# Patient Record
Sex: Female | Born: 1960 | Race: White | Hispanic: No | Marital: Married | Smoking: Never smoker
Health system: Southern US, Community
[De-identification: ages and names within clinical notes are randomized; demographics above are authoritative.]

## PROBLEM LIST (undated history)

## (undated) DIAGNOSIS — I2699 Other pulmonary embolism without acute cor pulmonale: Secondary | ICD-10-CM

## (undated) DIAGNOSIS — I2510 Atherosclerotic heart disease of native coronary artery without angina pectoris: Secondary | ICD-10-CM

## (undated) DIAGNOSIS — E785 Hyperlipidemia, unspecified: Secondary | ICD-10-CM

## (undated) DIAGNOSIS — I219 Acute myocardial infarction, unspecified: Secondary | ICD-10-CM

## (undated) DIAGNOSIS — R002 Palpitations: Secondary | ICD-10-CM

## (undated) DIAGNOSIS — M1990 Unspecified osteoarthritis, unspecified site: Secondary | ICD-10-CM

## (undated) DIAGNOSIS — S065XAA Traumatic subdural hemorrhage with loss of consciousness status unknown, initial encounter: Secondary | ICD-10-CM

## (undated) DIAGNOSIS — I10 Essential (primary) hypertension: Secondary | ICD-10-CM

## (undated) DIAGNOSIS — I5032 Chronic diastolic (congestive) heart failure: Secondary | ICD-10-CM

## (undated) DIAGNOSIS — R413 Other amnesia: Secondary | ICD-10-CM

## (undated) DIAGNOSIS — I509 Heart failure, unspecified: Secondary | ICD-10-CM

## (undated) DIAGNOSIS — M419 Scoliosis, unspecified: Secondary | ICD-10-CM

## (undated) DIAGNOSIS — G8929 Other chronic pain: Secondary | ICD-10-CM

## (undated) HISTORY — DX: Other amnesia: R41.3

## (undated) HISTORY — DX: Atherosclerotic heart disease of native coronary artery without angina pectoris: I25.10

## (undated) HISTORY — DX: Traumatic subdural hemorrhage with loss of consciousness status unknown, initial encounter: S06.5XAA

## (undated) HISTORY — PX: DILATION AND CURETTAGE OF UTERUS: SHX78

## (undated) HISTORY — DX: Heart failure, unspecified: I50.9

## (undated) HISTORY — DX: Palpitations: R00.2

## (undated) HISTORY — DX: Other pulmonary embolism without acute cor pulmonale: I26.99

## (undated) HISTORY — DX: Chronic diastolic (congestive) heart failure: I50.32

## (undated) HISTORY — DX: Other chronic pain: G89.29

## (undated) HISTORY — DX: Acute myocardial infarction, unspecified: I21.9

## (undated) HISTORY — DX: Hyperlipidemia, unspecified: E78.5

## (undated) HISTORY — PX: OTHER SURGICAL HISTORY: SHX169

## (undated) HISTORY — DX: Essential (primary) hypertension: I10

---

## 1998-10-06 HISTORY — PX: ABDOMINAL HYSTERECTOMY: SHX81

## 2002-04-23 ENCOUNTER — Inpatient Hospital Stay (HOSPITAL_COMMUNITY): Admission: EM | Admit: 2002-04-23 | Discharge: 2002-04-26 | Payer: Self-pay | Admitting: *Deleted

## 2005-03-11 ENCOUNTER — Other Ambulatory Visit: Payer: Self-pay

## 2006-02-17 ENCOUNTER — Other Ambulatory Visit: Payer: Self-pay

## 2006-02-24 ENCOUNTER — Inpatient Hospital Stay: Payer: Self-pay | Admitting: Unknown Physician Specialty

## 2007-04-28 ENCOUNTER — Ambulatory Visit: Payer: Self-pay | Admitting: Nurse Practitioner

## 2007-10-12 ENCOUNTER — Observation Stay: Payer: Self-pay | Admitting: Specialist

## 2007-10-12 ENCOUNTER — Other Ambulatory Visit: Payer: Self-pay

## 2008-07-14 ENCOUNTER — Ambulatory Visit: Payer: Self-pay | Admitting: Unknown Physician Specialty

## 2010-12-28 ENCOUNTER — Emergency Department: Payer: Self-pay | Admitting: Emergency Medicine

## 2011-08-21 ENCOUNTER — Ambulatory Visit: Payer: Self-pay | Admitting: Obstetrics and Gynecology

## 2014-07-14 ENCOUNTER — Emergency Department: Payer: Self-pay | Admitting: Emergency Medicine

## 2014-07-14 LAB — BASIC METABOLIC PANEL
ANION GAP: 8 (ref 7–16)
BUN: 12 mg/dL (ref 7–18)
CO2: 29 mmol/L (ref 21–32)
CREATININE: 0.76 mg/dL (ref 0.60–1.30)
Calcium, Total: 9.2 mg/dL (ref 8.5–10.1)
Chloride: 106 mmol/L (ref 98–107)
EGFR (Non-African Amer.): >60
GLUCOSE: 98 mg/dL (ref 65–99)
Osmolality: 285 (ref 275–301)
Potassium: 3.9 mmol/L (ref 3.5–5.1)
Sodium: 143 mmol/L (ref 136–145)

## 2014-07-14 LAB — CSF CELL COUNT WITH DIFFERENTIAL
CSF TUBE #: 1
CSF TUBE #: 3
EOS PCT: 0 %
EOS PCT: 0 %
LYMPHS PCT: 50 %
LYMPHS PCT: 60 %
MONOCYTES/MACROPHAGES: 10 %
Monocytes/Macrophages: 25 %
NEUTROS PCT: 25 %
Neutrophils: 30 %
OTHER CELLS: 0 %
Other Cells: 0 %
RBC (CSF): 117 /mm3
RBC (CSF): 2445 /mm3
WBC (CSF): 3 /mm3
WBC (CSF): 5 /mm3

## 2014-07-14 LAB — CBC WITH DIFFERENTIAL/PLATELET
Basophil #: 0.1 10*3/uL (ref 0.0–0.1)
Basophil %: 0.9 %
Eosinophil #: 0.1 10*3/uL (ref 0.0–0.7)
Eosinophil %: 1.9 %
HCT: 44.2 % (ref 35.0–47.0)
HGB: 14.3 g/dL (ref 12.0–16.0)
Lymphocyte #: 1.7 10*3/uL (ref 1.0–3.6)
Lymphocyte %: 24.1 %
MCH: 29.2 pg (ref 26.0–34.0)
MCHC: 32.4 g/dL (ref 32.0–36.0)
MCV: 90 fL (ref 80–100)
Monocyte #: 0.4 x10 3/mm (ref 0.2–0.9)
Monocyte %: 5.4 %
Neutrophil #: 4.8 10*3/uL (ref 1.4–6.5)
Neutrophil %: 67.7 %
PLATELETS: 268 10*3/uL (ref 150–440)
RBC: 4.9 10*6/uL (ref 3.80–5.20)
RDW: 13.2 % (ref 11.5–14.5)
WBC: 7.1 10*3/uL (ref 3.6–11.0)

## 2014-07-14 LAB — PROTIME-INR
INR: 1
Prothrombin Time: 12.9 secs (ref 11.5–14.7)

## 2014-07-14 LAB — GLUCOSE, CSF: GLUCOSE, CSF: 58 mg/dL (ref 40–75)

## 2014-07-14 LAB — PROTEIN, CSF: PROTEIN, CSF: 31 mg/dL (ref 15–45)

## 2014-07-14 LAB — APTT: Activated PTT: 27.4 secs (ref 23.6–35.9)

## 2014-07-15 ENCOUNTER — Encounter (HOSPITAL_COMMUNITY): Payer: Self-pay | Admitting: Internal Medicine

## 2014-07-15 ENCOUNTER — Inpatient Hospital Stay (HOSPITAL_COMMUNITY): Payer: Self-pay

## 2014-07-15 ENCOUNTER — Inpatient Hospital Stay (HOSPITAL_COMMUNITY)
Admission: EM | Admit: 2014-07-15 | Discharge: 2014-07-15 | DRG: 093 | Disposition: A | Discharge status: Home / Self Care | Payer: Self-pay | Source: Other Acute Inpatient Hospital | Attending: Internal Medicine | Admitting: Internal Medicine

## 2014-07-15 ENCOUNTER — Other Ambulatory Visit (HOSPITAL_COMMUNITY): Payer: Self-pay | Admitting: Internal Medicine

## 2014-07-15 DIAGNOSIS — R51 Headache: Secondary | ICD-10-CM

## 2014-07-15 DIAGNOSIS — M1990 Unspecified osteoarthritis, unspecified site: Secondary | ICD-10-CM | POA: Diagnosis present

## 2014-07-15 DIAGNOSIS — R839 Unspecified abnormal finding in cerebrospinal fluid: Secondary | ICD-10-CM | POA: Diagnosis present

## 2014-07-15 DIAGNOSIS — M797 Fibromyalgia: Secondary | ICD-10-CM | POA: Diagnosis present

## 2014-07-15 DIAGNOSIS — I10 Essential (primary) hypertension: Secondary | ICD-10-CM | POA: Diagnosis present

## 2014-07-15 DIAGNOSIS — R291 Meningismus: Principal | ICD-10-CM | POA: Diagnosis present

## 2014-07-15 DIAGNOSIS — G44201 Tension-type headache, unspecified, intractable: Secondary | ICD-10-CM

## 2014-07-15 DIAGNOSIS — R519 Headache, unspecified: Secondary | ICD-10-CM

## 2014-07-15 DIAGNOSIS — Z96649 Presence of unspecified artificial hip joint: Secondary | ICD-10-CM | POA: Diagnosis present

## 2014-07-15 DIAGNOSIS — M419 Scoliosis, unspecified: Secondary | ICD-10-CM | POA: Diagnosis present

## 2014-07-15 DIAGNOSIS — Z79899 Other long term (current) drug therapy: Secondary | ICD-10-CM

## 2014-07-15 DIAGNOSIS — G629 Polyneuropathy, unspecified: Secondary | ICD-10-CM | POA: Diagnosis present

## 2014-07-15 HISTORY — DX: Fibromyalgia: M79.7

## 2014-07-15 HISTORY — DX: Unspecified abnormal finding in cerebrospinal fluid: R83.9

## 2014-07-15 HISTORY — DX: Scoliosis, unspecified: M41.9

## 2014-07-15 HISTORY — DX: Unspecified osteoarthritis, unspecified site: M19.90

## 2014-07-15 LAB — CBC WITH DIFFERENTIAL/PLATELET
Basophils Absolute: 0 10*3/uL (ref 0.0–0.1)
Basophils Relative: 0 % (ref 0–1)
EOS PCT: 2 % (ref 0–5)
Eosinophils Absolute: 0.2 10*3/uL (ref 0.0–0.7)
HCT: 38.3 % (ref 36.0–46.0)
HEMOGLOBIN: 13 g/dL (ref 12.0–15.0)
LYMPHS ABS: 3.3 10*3/uL (ref 0.7–4.0)
Lymphocytes Relative: 42 % (ref 12–46)
MCH: 29.3 pg (ref 26.0–34.0)
MCHC: 33.9 g/dL (ref 30.0–36.0)
MCV: 86.5 fL (ref 78.0–100.0)
MONOS PCT: 6 % (ref 3–12)
Monocytes Absolute: 0.5 10*3/uL (ref 0.1–1.0)
Neutro Abs: 3.9 10*3/uL (ref 1.7–7.7)
Neutrophils Relative %: 50 % (ref 43–77)
Platelets: 255 10*3/uL (ref 150–400)
RBC: 4.43 MIL/uL (ref 3.87–5.11)
RDW: 13 % (ref 11.5–15.5)
WBC: 7.8 10*3/uL (ref 4.0–10.5)

## 2014-07-15 LAB — COMPREHENSIVE METABOLIC PANEL
ALT: 17 U/L (ref 0–35)
AST: 17 U/L (ref 0–37)
Albumin: 3.2 g/dL — ABNORMAL LOW (ref 3.5–5.2)
Alkaline Phosphatase: 67 U/L (ref 39–117)
Anion gap: 13 (ref 5–15)
BUN: 14 mg/dL (ref 6–23)
CALCIUM: 9 mg/dL (ref 8.4–10.5)
CO2: 27 mEq/L (ref 19–32)
Chloride: 104 mEq/L (ref 96–112)
Creatinine, Ser: 0.92 mg/dL (ref 0.50–1.10)
GFR, EST AFRICAN AMERICAN: 81 mL/min — AB (ref >90)
GFR, EST NON AFRICAN AMERICAN: 70 mL/min — AB (ref >90)
GLUCOSE: 110 mg/dL — AB (ref 70–99)
Potassium: 3.7 mEq/L (ref 3.7–5.3)
Sodium: 144 mEq/L (ref 137–147)
TOTAL PROTEIN: 6.4 g/dL (ref 6.0–8.3)
Total Bilirubin: 0.4 mg/dL (ref 0.3–1.2)

## 2014-07-15 LAB — LIPID PANEL
CHOLESTEROL: 155 mg/dL (ref 0–200)
HDL: 47 mg/dL (ref >39)
LDL Cholesterol: 87 mg/dL (ref 0–99)
Total CHOL/HDL Ratio: 3.3 RATIO
Triglycerides: 103 mg/dL (ref <150)
VLDL: 21 mg/dL (ref 0–40)

## 2014-07-15 LAB — PROTIME-INR
INR: 1.12 (ref 0.00–1.49)
Prothrombin Time: 14.4 seconds (ref 11.6–15.2)

## 2014-07-15 MED ORDER — ACETAMINOPHEN 325 MG PO TABS: 650.0000 mg | ORAL_TABLET | ORAL | Status: DC | PRN

## 2014-07-15 MED ORDER — KETOROLAC TROMETHAMINE 30 MG/ML IJ SOLN
30.0000 mg | Freq: Four times a day (QID) | INTRAMUSCULAR | Status: DC | PRN
  Administered 2014-07-15: 30 mg via INTRAVENOUS
  Filled 2014-07-15 (×2): qty 1

## 2014-07-15 MED ORDER — GABAPENTIN 300 MG PO CAPS
300.0000 mg | ORAL_CAPSULE | Freq: Two times a day (BID) | ORAL | Status: DC
  Administered 2014-07-15: 300 mg via ORAL
  Filled 2014-07-15 (×3): qty 1

## 2014-07-15 MED ORDER — OXYCODONE-ACETAMINOPHEN 5-325 MG PO TABS
1.0000 | ORAL_TABLET | Freq: Three times a day (TID) | ORAL | Status: DC | PRN
  Administered 2014-07-15: 1 via ORAL
  Administered 2014-07-15: 2 via ORAL
  Filled 2014-07-15: qty 2
  Filled 2014-07-15: qty 1

## 2014-07-15 MED ORDER — IBUPROFEN 600 MG PO TABS: 600.0000 mg | ORAL_TABLET | Freq: Four times a day (QID) | ORAL | Status: DC | PRN

## 2014-07-15 MED ORDER — BUTALBITAL-APAP-CAFFEINE 50-325-40 MG PO TABS: 2.0000 | ORAL_TABLET | ORAL | Status: DC | PRN

## 2014-07-15 NOTE — Consult Note (Signed)
NEURO HOSPITALIST CONSULT NOTE    Reason for Consult: HA, neck pain, csf with elevated RBC's concerning for [facility]  HPI:                                                                                                                                          Alicia Moyer is an 53 y.o. female, right handed, with a past medical history significant for scoliosis, arthritis, transferred to [facility] for further evaluation and management of the above stated symptoms/findings. Alicia Moyer stated that she never gets HA but this past Wednesday she woke up with a very severe HA located mainly in the front and top of the head, dull, associated with severe neck pain but no nausea, vomiting, photophobia, phonophobia, vertigo, double vision, focal weakness or numbness, slurred speech, language or vision impairment.  that has been present ever since. No fever, head or neck trauma. Said that the HA does go away even when she takes her pain medication.She tells me that she had a dental infection one month ago, treated with antibiotics, and that her physician was concerned about meningitis and sent her to the [facility] hospital ED. LP performed at [facility] showed normal white cells, protein, and glucose but significant elevation of RBC's (tube #1 2445, tube # 117. No xanthochromia. CT brain showed no acute intracranial abnormality. Patient transferred to [facility] for further care. At this moment, she complains of HA and neck pain.    Past Medical History  Diagnosis Date  . Scoliosis   . Arthritis     Past Surgical History  Procedure Laterality Date  . Hip replacement right      Family History  Problem Relation Age of Onset  . Aneurysm Other      grandmother    Social History:  reports that she has never smoked. She does not have any smokeless tobacco history on file. Her alcohol and drug histories are not on file.  Allergies  Allergen Reactions  . Clindamycin/Lincomycin     Rash,swelling of face  and neck    MEDICATIONS:                                                                                                                     I have reviewed the patient's  current medications. Scheduled: . gabapentin  300 mg Oral BID     ROS:                                                                                                                                       History obtained from the patient and chart review  General ROS: negative for - chills, fatigue, fever, night sweats, weight gain or weight loss Psychological ROS: negative for - behavioral disorder, hallucinations, memory difficulties, mood swings or suicidal ideation Ophthalmic ROS: negative for - blurry vision, double vision, eye pain or loss of vision ENT ROS: negative for - epistaxis, nasal discharge, oral lesions, sore throat, tinnitus or vertigo Allergy and Immunology ROS: negative for - hives or itchy/watery eyes Hematological and Lymphatic ROS: negative for - bleeding problems, bruising or swollen lymph nodes Endocrine ROS: negative for - galactorrhea, hair pattern changes, polydipsia/polyuria or temperature intolerance Respiratory ROS: negative for - cough, hemoptysis, shortness of breath or wheezing Cardiovascular ROS: negative for - chest pain, dyspnea on exertion, edema or irregular heartbeat Gastrointestinal ROS: negative for - abdominal pain, diarrhea, hematemesis, nausea/vomiting or stool incontinence Genito-Urinary ROS: negative for - dysuria, hematuria, incontinence or urinary frequency/urgency Musculoskeletal ROS: negative for - joint swelling or muscular weakness Neurological ROS: as noted in HPI Dermatological ROS: negative for rash and skin lesion changes  Physical exam: pleasant female in no apparent distress. Blood pressure 136/96, pulse 75, temperature 98.8 F (37.1 C), temperature source Oral, resp. rate 16, height 5\' 3"  (1.6 m), weight 70.308 kg (155 lb), SpO2 100.00%. Head:  normocephalic. Neck: supple, no bruits, no JVD. Cardiac: no murmurs. Lungs: clear. Abdomen: soft, no tender, no mass. Extremities: no edema.  Neurologic Examination:                                                                                                      General: Mental Status: Alert, oriented, thought content appropriate.  Speech fluent without evidence of aphasia.  Able to follow 3 step commands without difficulty. Cranial Nerves: II: Discs flat bilaterally; Visual fields grossly normal, pupils equal, round, reactive to light and accommodation III,IV, VI: ptosis not present, extra-ocular motions intact bilaterally V,VII: smile symmetric, facial light touch sensation normal bilaterally VIII: hearing normal bilaterally IX,X: gag reflex present XI: bilateral shoulder shrug XII: midline tongue extension without atrophy or fasciculations  Motor: Right : Upper extremity   5/5    Left:     Upper extremity   5/5  Lower extremity  5/5     Lower extremity   5/5 Tone and bulk:normal tone throughout; no atrophy noted Sensory: Pinprick and light touch intact throughout, bilaterally Deep Tendon Reflexes:  Right: Upper Extremity   Left: Upper extremity   biceps (C-5 to C-6) 2/4   biceps (C-5 to C-6) 2/4 tricep (C7) 2/4    triceps (C7) 2/4 Brachioradialis (C6) 2/4  Brachioradialis (C6) 2/4  Lower Extremity Lower Extremity  quadriceps (L-2 to L-4) 2/4   quadriceps (L-2 to L-4) 2/4 Achilles (S1) 2/4   Achilles (S1) 2/4  Plantars: Right: downgoing   Left: downgoing Cerebellar: normal finger-to-nose,  normal heel-to-shin test Gait:  No tested  No meningeal irritation signs.    No results found for this basename: cbc, bmp, coags, chol, tri, ldl, hga1c    No results found for this or any previous visit (from the past 48 hour(s)).  No results found.  Assessment/Plan:  53 y/o with new onset severe head/neck pain [facility] due to concern for SAH due to CSF  revealing elevated RBC's as detailed above. Neuro-exam without lateralizing signs, specifically no frank evidence of meningeal irritation signs. Question of brother with cerebral aneurysm. Patient looks great clinically. Recommend: MRI brain and neck. MRA brain. Toradol for HA. Will follow up      Wyatt Portelasvaldo Camilo, MD 07/15/2014, 3:53 AM

## 2014-07-15 NOTE — Discharge Summary (Signed)
Physician Discharge Summary  Ellwood DenseKathy S Haapala RUE:[id number] DOB: 10/12/1960 DOA: 07/15/2014  PCP: [facility] [place]  Admit date: 07/15/2014 Discharge date: 07/15/2014  Time spent: 25 minutes  Recommendations for Outpatient Follow-up:  1. D/c home  Discharge Diagnoses:  Principal Problem:   Meningismus  Active Problems:   Fibromyalgia   CSF abnormal   Essential hypertension   Discharge Condition:fair  Diet recommendation: low sodium  Filed Weights   07/15/14 0205 07/15/14 0440  Weight: 70.308 kg (155 lb) 70.035 kg (154 lb 6.4 oz)    History of present illness:  53 y.o. female with Past medical history of scoliosis, arthritis, fibromyalgia, multiple hip replacement on the right.  The patient presented with complaints of of headache that has been ongoing since last 2 days. She mentions that she does not get headache usually but 2 days back she woke up with severe frontal  headache with neck . She did not have any nausea, vomiting chest pain shortness of breath diaphoresis blurring of vision burning urination diarrhea constipation or any focal deficit.  She started having difficulty tolerating light and the headache was progressively worsening with this she went to her orthopedic doctor because of the neck pain referred her to her primary care doctor who referred her to [facility] .[facility] showed normal white cells, protein, and glucose but significant elevation of RBC's (tube #1 2445, tube # 117. No xanthochromia. CT brain showed no acute intracranial abnormality. Patient [facility].       Hospital Course:   Meningismus  patient presented with  headache without any focal deficit on examination. CT of the head is negative for any acute abnormality . LP done showed significant rbcs and likely traumatic with only few wbc and no bacteria. Patient seen by neurology and recommended MRI brain ,  MRA head and MRI cervical spine which were all unremarkable. Patient placed on prn Fioricet and toradol with markedly improved. Symptoms. D/w neurology, no further w/up needed. Will discharge her on prn motrin for headache. She will follow up with her PCP  HTN  resume home meds     Procedures:  Head CT and [facility]  MRI brain, MRA head and MRI c spine  Consultations:  neurology  Discharge Exam: Filed Vitals:   07/15/14 1130  BP: 107/66  Pulse: 66  Temp: 98.7 F (37.1 C)  Resp: 14    General: NAD HEEBNT: no pallor, moist mucosa Chest: clear b/l  CVS: NS1&S2, no murmurs Abd: soft, NT, ND, Ext: warm, no edema  CNS: alert and oriented, no focal deficit, no meningeal sings   Discharge Instructions You were cared for by a hospitalist during your hospital stay. If you have any questions about your discharge medications or the care you received while you were in the hospital after you are discharged, you can call the unit and asked to speak with the hospitalist on call if the hospitalist that took care of you is not available. Once you are discharged, your primary care physician will handle any further medical issues. Please note that NO REFILLS for any discharge medications will be authorized once you are discharged, as it is imperative that you return to your primary care physician (or establish a relationship with a primary care physician if you do not have one) for your aftercare needs so that they can reassess your need for medications and monitor your lab values.   Current  Discharge Medication List    START taking these medications   Details  ibuprofen (ADVIL,MOTRIN) 600 MG tablet Take 1 tablet (600 mg total) by mouth every 6 (six) hours as needed for headache. Qty: 30 tablet, Refills: 0      CONTINUE these medications which have NOT CHANGED   Details  gabapentin (NEURONTIN) 300 MG capsule Take 300 mg by mouth 3 (three) times daily.     hydrochlorothiazide  (HYDRODIURIL) 25 MG tablet Take 25 mg by mouth daily.    Tapentadol HCl (NUCYNTA) 100 MG TABS Take 100 mg by mouth 2 (two) times daily.       Allergies  Allergen Reactions  . Clindamycin/Lincomycin     Rash,swelling of face and neck   Follow-up Information   Please follow up. (follows at [facility] in [place] ., [place])        The results of significant diagnostics from this hospitalization (including imaging, microbiology, ancillary and laboratory) are listed below for reference.    Significant Diagnostic Studies: Mr Shirlee Latch Wo Contrast  07/15/2014   CLINICAL DATA:  53 year old female with severe frontal headache. Constant pain associated with central neck pain and light sensitivity. Initial encounter.  EXAM: MRI HEAD WITHOUT CONTRAST  MRA HEAD WITHOUT CONTRAST  TECHNIQUE: Multiplanar, multiecho pulse sequences of the brain and surrounding structures were obtained without intravenous contrast. Angiographic images of the head were obtained using MRA technique without contrast.  COMPARISON:  Cervical spine MRI from the same day reported separately.  FINDINGS: MRI HEAD FINDINGS  Cerebral volume is normal. No restricted diffusion to suggest acute infarction. No midline shift, mass effect, evidence of mass lesion, ventriculomegaly, extra-axial collection or acute intracranial hemorrhage. Cervicomedullary junction and pituitary are within normal limits. Cervical spine reported separately. Major intracranial vascular flow voids are preserved. Wallace Cullens and white matter signal is within normal limits throughout the brain.  Visible internal auditory structures appear normal. Visualized paranasal sinuses and mastoids are clear. Visualized orbit soft tissues are within normal limits. Visualized scalp soft tissues are within normal limits. Visualized bone marrow signal is within normal limits.  MRA HEAD FINDINGS  Antegrade flow in the posterior circulation with codominant distal vertebral arteries.  Normal PICA origins. Normal vertebrobasilar junction. Normal basilar artery, SCA and PCA origins. Right greater than left posterior communicating arteries are present. Bilateral PCA branches are within normal limits.  Antegrade flow in both ICA siphons. No siphon stenosis. Ophthalmic and posterior communicating artery origins are within normal limits. Normal carotid termini, MCA and ACA origins. Anterior communicating artery and visualized bilateral ACA branches are within normal limits. Visualized bilateral MCA branches are within normal limits.  IMPRESSION: 1.  Normal non contrast MRI appearance of the brain. 2.  Negative intracranial MRA.   Electronically Signed   By: Augusto Gamble M.D.   On: 07/15/2014 10:30   Mr Brain Wo Contrast  07/15/2014   CLINICAL DATA:  53 year old female with severe frontal headache. Constant pain associated with central neck pain and light sensitivity. Initial encounter.  EXAM: MRI HEAD WITHOUT CONTRAST  MRA HEAD WITHOUT CONTRAST  TECHNIQUE: Multiplanar, multiecho pulse sequences of the brain and surrounding structures were obtained without intravenous contrast. Angiographic images of the head were obtained using MRA technique without contrast.  COMPARISON:  Cervical spine MRI from the same day reported separately.  FINDINGS: MRI HEAD FINDINGS  Cerebral volume is normal. No restricted diffusion to suggest acute infarction. No midline shift, mass effect, evidence of mass lesion, ventriculomegaly, extra-axial collection or acute  intracranial hemorrhage. Cervicomedullary junction and pituitary are within normal limits. Cervical spine reported separately. Major intracranial vascular flow voids are preserved. Wallace CullensGray and white matter signal is within normal limits throughout the brain.  Visible internal auditory structures appear normal. Visualized paranasal sinuses and mastoids are clear. Visualized orbit soft tissues are within normal limits. Visualized scalp soft tissues are within normal  limits. Visualized bone marrow signal is within normal limits.  MRA HEAD FINDINGS  Antegrade flow in the posterior circulation with codominant distal vertebral arteries. Normal PICA origins. Normal vertebrobasilar junction. Normal basilar artery, SCA and PCA origins. Right greater than left posterior communicating arteries are present. Bilateral PCA branches are within normal limits.  Antegrade flow in both ICA siphons. No siphon stenosis. Ophthalmic and posterior communicating artery origins are within normal limits. Normal carotid termini, MCA and ACA origins. Anterior communicating artery and visualized bilateral ACA branches are within normal limits. Visualized bilateral MCA branches are within normal limits.  IMPRESSION: 1.  Normal non contrast MRI appearance of the brain. 2.  Negative intracranial MRA.   Electronically Signed   By: Augusto GambleLee  Stockley M.D.   On: 07/15/2014 10:30   Mr Cervical Spine Wo Contrast  07/15/2014   CLINICAL DATA:  Severe neck pain for 4-5 days.  EXAM: MRI CERVICAL SPINE WITHOUT CONTRAST  TECHNIQUE: Multiplanar, multisequence MR imaging of the cervical spine was performed. No intravenous contrast was administered.  COMPARISON:  None.  FINDINGS: Degenerative cervical spondylosis mainly in the mid to lower cervical spine and most significant at C5-6 and C6-7. There is disc disease and facet disease along with degenerative subluxations. There are endplate reactive changes at C5-6 and C6-7. No acute bony findings or worrisome bone lesion. The cervical spinal cord is unremarkable. No cord lesions or syrinx.  C2-3:  No significant findings.  C3-4: Mild annular bulge and mild osteophytic ridging but no significant spinal or foraminal stenosis. Mild foraminal encroachment on the right side.  C4-5: Bulging annulus and shallow central disc protrusion with mild mass effect on the ventral thecal sac. Mild facet disease but no foraminal stenosis.  C5-6: Advanced degenerative disc disease with a bulging  degenerated annulus, osteophytic ridging and uncinate spurring. This in combination with advanced facet disease contributes to mild spinal stenosis and moderate bilateral foraminal stenosis.  C6-7: Degenerated bulging annulus, osteophytic ridging, uncinate spurring and facet disease contributing to mild spinal and mild foraminal stenosis.  C7-T1:  No significant findings.  IMPRESSION: Degenerative cervical spondylosis with multilevel disc disease and facet disease most notable at C4-5, C5-6 and C6-7. Spinal and foraminal stenosis as discussed above.   Electronically Signed   By: Loralie ChampagneMark  Gallerani M.D.   On: 07/15/2014 10:09    Microbiology: No results found for this or any previous visit (from the past 240 hour(s)).   Labs: Basic Metabolic Panel:  Recent Labs Lab 07/15/14 0406  NA 144  K 3.7  CL 104  CO2 27  GLUCOSE 110*  BUN 14  CREATININE 0.92  CALCIUM 9.0   Liver Function Tests:  Recent Labs Lab 07/15/14 0406  AST 17  ALT 17  ALKPHOS 67  BILITOT 0.4  PROT 6.4  ALBUMIN 3.2*   No results found for this basename: LIPASE, AMYLASE,  in the last 168 hours No results found for this basename: AMMONIA,  in the last 168 hours CBC:  Recent Labs Lab 07/15/14 0406  WBC 7.8  NEUTROABS 3.9  HGB 13.0  HCT 38.3  MCV 86.5  PLT 255  INR 1.0, APTT 27.4,  CSF tube 1  Rbc 2445, WBC 5, neutrophil 30%, glucose 58, protein 31  CSF tube 3  Rbc 117, WBC 3  CSF Gram stain moderate RBC few WBC no organism  Cardiac Enzymes: No results found for this basename: CKTOTAL, CKMB, CKMBINDEX, TROPONINI,  in the last 168 hours BNP: BNP (last 3 results) No results found for this basename: PROBNP,  in the last 8760 hours CBG: No results found for this basename: GLUCAP,  in the last 168 hours     Signed:  Eddie North  [facility] 07/15/2014, 1:45 PM

## 2014-07-15 NOTE — Progress Notes (Signed)
PT Cancellation Note  Patient Details Name: [name] MRN: [id number] DOB: 12-04-1960   Cancelled Treatment:    Reason Eval/Treat Not Completed: Other (comment). Spoke with RN. Pt ambulatory at baseline. Pt planning to see ortho MD regarding chronic hip pain. Pt planned for D/C today. Will sign off. Thanks    Donnamarie PoagWest, Cardelia Sassano [place], [place]  [phone] 07/15/2014, 2:38 PM

## 2014-07-15 NOTE — H&P (Signed)
[facility] History and Physical  Patient: Alicia Moyer  ONG:[id number]  DOB: 1961-04-03  DOS: the patient was seen and examined on 07/15/2014 PCP: No primary provider on file.  Chief Complaint: Headache  HPI: Alicia Moyer is a 53 y.o. female with Past medical history of scoliosis, arthritis, fibromyalgia, multiple hip replacement on the right. The patient presents with complaints of of headache that has been ongoing since last 2 days. She mentions that she does not get headache usually but this Wednesday she woke up with severe headache which is located in the front of constant pain without any throbbing associated with neck pain which was in the center of the neck. She did not have any nausea vomiting chest pain shortness of breath diaphoresis blurring of vision burning urination diarrhea constipation or any focal deficit. She started having difficulty tolerating light and the headache was progressively worsening with this she went to her orthopedic doctor because of the neck pain referred her to her primary care doctor who referred her to ER from where the patient on [facility]. Patient the time of my evaluation continues to deny any focal deficit continues to complain of a headache as well as neck pain and photophobia.  The patient is coming from home. And at her baseline independent for most of her ADL.  Review of Systems: as mentioned in the history of present illness.  A Comprehensive review of the other systems is negative.  Past Medical History  Diagnosis Date  . Scoliosis   . Arthritis    Past Surgical History  Procedure Laterality Date  . Hip replacement right     Social History:  reports that she has never smoked. She does not have any smokeless tobacco history on file. Her alcohol and drug histories are not on file.  Allergies  Allergen Reactions  . Clindamycin/Lincomycin     Rash,swelling of face and neck     Family History  Problem Relation Age of Onset  . Aneurysm Other      grandmother    Prior to Admission medications   Not on File    Physical Exam: Filed Vitals:   07/15/14 0205  BP: 136/96  Pulse: 75  Temp: 98.8 F (37.1 C)  TempSrc: Oral  Resp: 16  Height: 5\' 3"  (1.6 m)  Weight: 70.308 kg (155 lb)  SpO2: 100%    General: Alert, Awake and Oriented to Time, Place and Person. Appear in mild distress Eyes: PERRL ENT: Oral Mucosa clear moist. Neck: no JVD Cardiovascular: S1 and S2 Present, no Murmur, Peripheral Pulses Present Respiratory: Bilateral Air entry equal and Decreased, Clear to Auscultation, oCrackles, no wheezes Abdomen: Bowel Sound present, Soft and non tender Skin: no Rash Extremities: no Pedal edema, no calf tenderness Neurologic: Grossly no focal neuro deficit. Neck stiffness present along with photophobia.  Labs on Admission:  CBC: WBC 7.1 Hemoglobin 14.3 A platelet 268  CMP  BUN 12, creatinine 0.76, sodium 143, potassium 3.9, chloride 106, CO2 29, glucose 98, osmolality 285  INR 1.0, APTT 27.4, CSF tube 1 Rbc 2445, WBC 5, neutrophil 30%, glucose 58, protein 31 CSF tube 3 Rbc 117, WBC 3 CSF Gram stain moderate RBC few WBC no organism   Radiological Exams on Admission: CT head next line no intra-axial or extra-axial pathologic fluid or blood collection, no mass lesion, no hydrocephalus, no acute bony abnormality,  Assessment/Plan Active Problems:   Meningismus   Fibromyalgia   CSF abnormal  1. Meningismus The patient is presenting with complaints of headache she does not have any focal deficit on examination as well as CT of the head is negative for any acute abnormality but she is found to have significant rbc in her CSF. Due to her history of scoliosis she could very well have a traumatic CSF puncture but possibility of SAH cannot be ruled out. With this neurology will be consulted for the patient we will follow their  recommendation. At present I will use Fioricet and Percocet for headache. Along with Tylenol Serial neuro checks and telemetry monitoring.  2. History of neuropathy continue on her home doses  3. History of hypertension Patient stable at present hold  hydrochlorothiazide.  Advance goals of care discussion: Full code   Consults: Neurology  DVT Prophylaxis: mechanical compression device Nutrition: Cardiac  Disposition: Admitted to inpatient in telemetry unit.  Author: Lynden OxfordPranav Erminie Foulks, MD [facility] Pager: [phone] 07/15/2014, 3:04 AM    If 7PM-7AM, please contact night-coverage [email or web address] Password [email or web address]

## 2014-07-17 LAB — CSF CULTURE

## 2014-07-18 NOTE — Progress Notes (Signed)
UR Completed Rollie Hynek Graves-Bigelow, RN,BSN [phone]  

## 2018-05-16 NOTE — Progress Notes (Deleted)
Cardiology Office Note  Date:  05/16/2018   ID:  Alicia Moyer, DOB [birth year], MRN [id number]  PCP:  No primary care provider on file.   No chief complaint on file.   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     PMH:   has a past medical history of Arthritis and Scoliosis.  PSH:   *** The histories are not reviewed yet. Please review them in the "History" navigator section and refresh this SmartLink.  Current Outpatient Medications  Medication Sig Dispense Refill  . gabapentin (NEURONTIN) 300 MG capsule Take 300 mg by mouth 3 (three) times daily.     . hydrochlorothiazide (HYDRODIURIL) 25 MG tablet Take 25 mg by mouth daily.    Marland Kitchen. ibuprofen (ADVIL,MOTRIN) 600 MG tablet Take 1 tablet (600 mg total) by mouth every 6 (six) hours as needed for headache. 30 tablet 0  . Tapentadol HCl (NUCYNTA) 100 MG TABS Take 100 mg by mouth 2 (two) times daily.     No current facility-administered medications for this visit.      Allergies:   Clindamycin/lincomycin   Social History:  The patient  reports that she has never smoked. She does not have any smokeless tobacco history on file.   Family History:   family history includes Aneurysm in her other.    Review of Systems: ROS   PHYSICAL EXAM: VS:  There were no vitals taken for this visit. , BMI There is no height or weight on file to calculate BMI. GEN: Well nourished, well developed, in no acute distress HEENT: normal Neck: no JVD, carotid bruits, or masses Cardiac: RRR; no murmurs, rubs, or gallops,no edema  Respiratory:  clear to auscultation bilaterally, normal work of breathing GI: soft, nontender, nondistended, + BS MS: no deformity or atrophy Skin: warm and dry, no rash Neuro:  Strength and sensation are intact Psych: euthymic mood, full affect    Recent Labs: No results found for requested labs within last 8760 hours.    Lipid Panel Lab Results  Component Value Date    CHOL 155 07/15/2014   HDL 47 07/15/2014   LDLCALC 87 07/15/2014   TRIG 103 07/15/2014      Wt Readings from Last 3 Encounters:  07/15/14 154 lb 6.4 oz (70 kg)       ASSESSMENT AND PLAN:  No diagnosis found.   Disposition:   F/U  6 months  No orders of the defined types were placed in this encounter.    Signed, Dossie Arbourim Amonie Wisser, M.D., Ph.D. 05/16/2018  [facility], [place] [phone]

## 2018-05-18 ENCOUNTER — Encounter

## 2018-05-18 ENCOUNTER — Ambulatory Visit: Payer: Self-pay | Admitting: Cardiovascular Disease

## 2018-05-19 ENCOUNTER — Encounter: Payer: Self-pay | Admitting: Cardiovascular Disease

## 2021-05-05 ENCOUNTER — Emergency Department: Payer: Medicare PPO

## 2021-05-05 ENCOUNTER — Other Ambulatory Visit: Payer: Self-pay

## 2021-05-05 ENCOUNTER — Emergency Department
Admission: EM | Admit: 2021-05-05 | Discharge: 2021-05-05 | Disposition: A | Discharge status: Home / Self Care | Payer: Medicare PPO | Attending: Emergency Medicine | Admitting: Emergency Medicine

## 2021-05-05 DIAGNOSIS — K209 Esophagitis, unspecified without bleeding: Secondary | ICD-10-CM | POA: Diagnosis not present

## 2021-05-05 DIAGNOSIS — I10 Essential (primary) hypertension: Secondary | ICD-10-CM | POA: Diagnosis not present

## 2021-05-05 DIAGNOSIS — Z79899 Other long term (current) drug therapy: Secondary | ICD-10-CM | POA: Diagnosis not present

## 2021-05-05 DIAGNOSIS — R1031 Right lower quadrant pain: Secondary | ICD-10-CM | POA: Diagnosis present

## 2021-05-05 DIAGNOSIS — U071 COVID-19: Secondary | ICD-10-CM

## 2021-05-05 DIAGNOSIS — R197 Diarrhea, unspecified: Secondary | ICD-10-CM

## 2021-05-05 DIAGNOSIS — Z28310 Unvaccinated for covid-19: Secondary | ICD-10-CM | POA: Diagnosis not present

## 2021-05-05 DIAGNOSIS — R112 Nausea with vomiting, unspecified: Secondary | ICD-10-CM

## 2021-05-05 LAB — CBC WITH DIFFERENTIAL/PLATELET
Abs Immature Granulocytes: 0.07 10*3/uL (ref 0.00–0.07)
Basophils Absolute: 0.1 10*3/uL (ref 0.0–0.1)
Basophils Relative: 0 %
Eosinophils Absolute: 0 10*3/uL (ref 0.0–0.5)
Eosinophils Relative: 0 %
HCT: 48.1 % — ABNORMAL HIGH (ref 36.0–46.0)
Hemoglobin: 16.6 g/dL — ABNORMAL HIGH (ref 12.0–15.0)
Immature Granulocytes: 1 %
Lymphocytes Relative: 13 %
Lymphs Abs: 1.8 10*3/uL (ref 0.7–4.0)
MCH: 30.1 pg (ref 26.0–34.0)
MCHC: 34.5 g/dL (ref 30.0–36.0)
MCV: 87.3 fL (ref 80.0–100.0)
Monocytes Absolute: 1.2 10*3/uL — ABNORMAL HIGH (ref 0.1–1.0)
Monocytes Relative: 9 %
Neutro Abs: 10.6 10*3/uL — ABNORMAL HIGH (ref 1.7–7.7)
Neutrophils Relative %: 77 %
Platelets: 386 10*3/uL (ref 150–400)
RBC: 5.51 MIL/uL — ABNORMAL HIGH (ref 3.87–5.11)
RDW: 13 % (ref 11.5–15.5)
WBC: 13.7 10*3/uL — ABNORMAL HIGH (ref 4.0–10.5)
nRBC: 0 % (ref 0.0–0.2)

## 2021-05-05 LAB — COMPREHENSIVE METABOLIC PANEL
ALT: 19 U/L (ref 0–44)
AST: 27 U/L (ref 15–41)
Albumin: 4.2 g/dL (ref 3.5–5.0)
Alkaline Phosphatase: 66 U/L (ref 38–126)
Anion gap: 13 (ref 5–15)
BUN: 28 mg/dL — ABNORMAL HIGH (ref 6–20)
CO2: 27 mmol/L (ref 22–32)
Calcium: 10.1 mg/dL (ref 8.9–10.3)
Chloride: 97 mmol/L — ABNORMAL LOW (ref 98–111)
Creatinine, Ser: 0.95 mg/dL (ref 0.44–1.00)
GFR, Estimated: >60 mL/min (ref >60)
Glucose, Bld: 134 mg/dL — ABNORMAL HIGH (ref 70–99)
Potassium: 3.5 mmol/L (ref 3.5–5.1)
Sodium: 137 mmol/L (ref 135–145)
Total Bilirubin: 0.8 mg/dL (ref 0.3–1.2)
Total Protein: 8.2 g/dL — ABNORMAL HIGH (ref 6.5–8.1)

## 2021-05-05 LAB — RESP PANEL BY RT-PCR (FLU A&B, COVID) ARPGX2
Influenza A by PCR: NEGATIVE
Influenza B by PCR: NEGATIVE
SARS Coronavirus 2 by RT PCR: POSITIVE — AB

## 2021-05-05 LAB — URINALYSIS, COMPLETE (UACMP) WITH MICROSCOPIC
Bacteria, UA: NONE SEEN
Bilirubin Urine: NEGATIVE
Glucose, UA: NEGATIVE mg/dL
Ketones, ur: NEGATIVE mg/dL
Nitrite: NEGATIVE
Protein, ur: NEGATIVE mg/dL
Specific Gravity, Urine: >1.046 — ABNORMAL HIGH (ref 1.005–1.030)
pH: 5 (ref 5.0–8.0)

## 2021-05-05 LAB — LIPASE, BLOOD: Lipase: 47 U/L (ref 11–51)

## 2021-05-05 LAB — MAGNESIUM: Magnesium: 2.2 mg/dL (ref 1.7–2.4)

## 2021-05-05 LAB — LACTIC ACID, PLASMA: Lactic Acid, Venous: 1.3 mmol/L (ref 0.5–1.9)

## 2021-05-05 MED ORDER — METHYLPREDNISOLONE SODIUM SUCC 125 MG IJ SOLR: 125.0000 mg | Freq: Once | INTRAMUSCULAR | Status: DC | PRN

## 2021-05-05 MED ORDER — ONDANSETRON HCL 4 MG/2ML IJ SOLN
4.0000 mg | Freq: Once | INTRAMUSCULAR | Status: AC
  Administered 2021-05-05: 4 mg via INTRAVENOUS
  Filled 2021-05-05: qty 2

## 2021-05-05 MED ORDER — ALBUTEROL SULFATE HFA 108 (90 BASE) MCG/ACT IN AERS
2.0000 | INHALATION_SPRAY | Freq: Once | RESPIRATORY_TRACT | Status: DC | PRN
  Filled 2021-05-05: qty 6.7

## 2021-05-05 MED ORDER — DIPHENHYDRAMINE HCL 50 MG/ML IJ SOLN: 50.0000 mg | Freq: Once | INTRAMUSCULAR | Status: DC | PRN

## 2021-05-05 MED ORDER — IOHEXOL 350 MG/ML SOLN
75.0000 mL | Freq: Once | INTRAVENOUS | Status: AC | PRN
  Administered 2021-05-05: 75 mL via INTRAVENOUS

## 2021-05-05 MED ORDER — SODIUM CHLORIDE 0.9 % IV SOLN: INTRAVENOUS | Status: DC | PRN

## 2021-05-05 MED ORDER — DROPERIDOL 2.5 MG/ML IJ SOLN
1.2500 mg | Freq: Once | INTRAMUSCULAR | Status: AC
  Administered 2021-05-05: 1.25 mg via INTRAVENOUS
  Filled 2021-05-05: qty 2

## 2021-05-05 MED ORDER — PANTOPRAZOLE SODIUM 40 MG IV SOLR
40.0000 mg | Freq: Once | INTRAVENOUS | Status: AC
  Administered 2021-05-05: 40 mg via INTRAVENOUS
  Filled 2021-05-05: qty 40

## 2021-05-05 MED ORDER — PANTOPRAZOLE SODIUM 20 MG PO TBEC: 20.0000 mg | DELAYED_RELEASE_TABLET | Freq: Every day | ORAL | 0 refills | Status: DC

## 2021-05-05 MED ORDER — EPINEPHRINE 0.3 MG/0.3ML IJ SOAJ: 0.3000 mg | Freq: Once | INTRAMUSCULAR | Status: DC | PRN

## 2021-05-05 MED ORDER — SODIUM CHLORIDE 0.9 % IV BOLUS
1000.0000 mL | Freq: Once | INTRAVENOUS | Status: AC
  Administered 2021-05-05: 1000 mL via INTRAVENOUS

## 2021-05-05 MED ORDER — ONDANSETRON 4 MG PO TBDP: 4.0000 mg | ORAL_TABLET | Freq: Three times a day (TID) | ORAL | 0 refills | Status: DC | PRN

## 2021-05-05 MED ORDER — FAMOTIDINE IN NACL 20-0.9 MG/50ML-% IV SOLN: 20.0000 mg | Freq: Once | INTRAVENOUS | Status: DC | PRN

## 2021-05-05 MED ORDER — HYDROMORPHONE HCL 1 MG/ML IJ SOLN
0.5000 mg | Freq: Once | INTRAMUSCULAR | Status: AC
  Administered 2021-05-05: 0.5 mg via INTRAVENOUS
  Filled 2021-05-05: qty 1

## 2021-05-05 MED ORDER — BEBTELOVIMAB 175 MG/2 ML IV (EUA)
175.0000 mg | Freq: Once | INTRAMUSCULAR | Status: AC
  Administered 2021-05-05: 175 mg via INTRAVENOUS
  Filled 2021-05-05: qty 2

## 2021-05-05 NOTE — ED Triage Notes (Signed)
5F from home. N/V/D x 5 days.   20G left hand 166/115 96 110 99*

## 2021-05-05 NOTE — ED Notes (Signed)
Assisted Pt to toilet then back to bed.

## 2021-05-05 NOTE — Discharge Instructions (Addendum)
Take Zofran to help with nausea and the Protonix to help with concern for esophagitis.  Try to drink plenty of fluids including Pedialyte and/or Gatorade without sugar.  Return to the ER if develop worsening symptoms, shortness of breath, nausea or vomiting or any other concern.  You need to stay quarantined due to you having COVID    IMPRESSION: 1. The appendix is visualized and appears normal. 2. Liquid school stool noted throughout the colon which may reflect acute diarrheal illness. 3. Mild circumferential wall thickening of the distal esophagus is identified. Correlate for any clinical signs or symptoms of esophagitis.

## 2021-05-05 NOTE — ED Notes (Signed)
Signature pad not working. Verbal consent for discharge given.

## 2021-05-05 NOTE — ED Provider Notes (Signed)
[facility] Emergency Department Provider Note  ____________________________________________   Event Date/Time   First MD Initiated Contact with Patient 05/05/21 [phone]     (approximate)  I have reviewed the triage vital signs and the nursing notes.   HISTORY  Chief Complaint Abdominal Pain    HPI Alicia Moyer is a 60 y.o. female with history of hypertension, chronic pain who comes in with concerns for abdominal pain nausea, vomiting, diarrhea.  Patient reports having symptoms started on Tuesday.  Patient reports multiple episodes of vomiting, multiple episodes of diarrhea, brown stool.  Also reporting right lower quadrant pain that is severe, constant, nothing makes it better or worse.  States that she is not been able to eat anything.  She does report a little bit of cough.  Reports a little bit of a sore throat but that has since resolved.  Patient denies having her COVID vaccines and denies any recent COVID infections.  Denies any chest pain, shortness of breath.          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 Authorizing Provider  gabapentin (NEURONTIN) 300 MG capsule Take 300 mg by mouth 3 (three) times daily.     [provider]  hydrochlorothiazide (HYDRODIURIL) 25 MG tablet Take 25 mg by mouth daily.    [provider]  ibuprofen (ADVIL,MOTRIN) 600 MG tablet Take 1 tablet (600 mg total) by mouth every 6 (six) hours as needed for headache. 07/15/14   Dhungel, Theda BelfastNishant, MD  Tapentadol HCl (NUCYNTA) 100 MG TABS Take 100 mg by mouth 2 (two) times daily.    [provider]    Allergies Clindamycin/lincomycin  Family History  Problem Relation Age  of Onset   Aneurysm Other         grandmother    Social History Social History   Tobacco Use   Smoking status: Never      Review of Systems Constitutional: No fever/chills Eyes: No visual changes. ENT: No sore throat. Cardiovascular: Denies chest pain. Respiratory: Denies shortness of breath. Gastrointestinal: Positive abdominal pain, nausea, vomiting, diarrhea Genitourinary: Negative for dysuria. Musculoskeletal: Negative for back pain. Skin: Negative for rash. Neurological: Negative for headaches, focal weakness or numbness. All other ROS negative ____________________________________________   PHYSICAL EXAM:  VITAL SIGNS: Blood pressure (!) 141/85, pulse (!) 101, temperature 98.3 F (36.8 C), temperature source Oral, resp. rate 18, height 5\' 3"  (1.6 m), weight 65.8 kg, SpO2 98 %.   Constitutional: Alert and oriented. Well appearing and in no acute distress. Eyes: Conjunctivae are normal. EOMI. Head: Atraumatic. Nose: No congestion/rhinnorhea. Mouth/Throat: Mucous membranes are dry Neck: No stridor. Trachea Midline. FROM Cardiovascular: Tachycardic, regular rhythm. Grossly normal heart sounds.  Good peripheral circulation. Respiratory: Normal respiratory effort.  No retractions. Lungs CTAB. Gastrointestinal: Significantly tender in the right lower quadrant.  No distention. No abdominal bruits.  Musculoskeletal: No lower extremity tenderness nor edema.  No joint effusions. Neurologic:  Normal speech and language. No gross focal neurologic deficits are appreciated.  Skin:  Skin is warm, dry and intact. No rash noted. Psychiatric: Mood and affect are normal. Speech and behavior are normal. GU: Deferred   ____________________________________________   LABS (all  labs ordered are listed, but only abnormal results are displayed)  Labs Reviewed  RESP PANEL BY RT-PCR (FLU A&B, COVID) ARPGX2 - Abnormal; Notable for the following components:      Result Value   SARS  Coronavirus 2 by RT PCR POSITIVE (*)    All other components within normal limits  CBC WITH DIFFERENTIAL/PLATELET - Abnormal; Notable for the following components:   WBC 13.7 (*)    RBC 5.51 (*)    Hemoglobin 16.6 (*)    HCT 48.1 (*)    Neutro Abs 10.6 (*)    Monocytes Absolute 1.2 (*)    All other components within normal limits  COMPREHENSIVE METABOLIC PANEL - Abnormal; Notable for the following components:   Chloride 97 (*)    Glucose, Bld 134 (*)    BUN 28 (*)    Total Protein 8.2 (*)    All other components within normal limits  URINALYSIS, COMPLETE (UACMP) WITH MICROSCOPIC - Abnormal; Notable for the following components:   Color, Urine YELLOW (*)    APPearance HAZY (*)    Specific Gravity, Urine >1.046 (*)    Hgb urine dipstick SMALL (*)    Leukocytes,Ua SMALL (*)    All other components within normal limits  GASTROINTESTINAL PANEL BY PCR, STOOL (REPLACES STOOL CULTURE)  C DIFFICILE QUICK SCREEN W PCR REFLEX    LIPASE, BLOOD  MAGNESIUM  LACTIC ACID, PLASMA   ____________________________________________   ED ECG REPORT I, Concha Se, the attending physician, personally viewed and interpreted this ECG.  Normal sinus rate of 92, no ST elevation, no T wave versions, normal intervals ____________________________________________  RADIOLOGY Vela Prose, personally viewed and evaluated these images (plain radiographs) as part of my medical decision making, as well as reviewing the written report by the radiologist.  ED MD interpretation: No pneumonia  Official radiology report(s): DG Chest 2 View  Result Date: 05/05/2021 CLINICAL DATA:  Cough EXAM: CHEST - 2 VIEW COMPARISON:  12/29/2010 FINDINGS: The heart size and mediastinal contours are within normal limits. Both lungs are clear. Thoracolumbar scoliosis. IMPRESSION: No active cardiopulmonary disease. Electronically Signed   By: Signa Kell M.D.   On: 05/05/2021 08:35   CT ABDOMEN PELVIS W CONTRAST  Result  Date: 05/05/2021 CLINICAL DATA:  Right lower quadrant abdominal pain. EXAM: CT ABDOMEN AND PELVIS WITH CONTRAST TECHNIQUE: Multidetector CT imaging of the abdomen and pelvis was performed using the standard protocol following bolus administration of intravenous contrast. CONTRAST:  26mL OMNIPAQUE IOHEXOL 350 MG/ML SOLN COMPARISON:  None. FINDINGS: Lower chest: No acute abnormality. Hepatobiliary: No focal liver abnormality is seen. No gallstones, gallbladder wall thickening, or biliary dilatation. Pancreas: Unremarkable. No pancreatic ductal dilatation or surrounding inflammatory changes. Spleen: Normal in size without focal abnormality. Adrenals/Urinary Tract: Normal adrenal glands. No kidney stones, mass or hydronephrosis. Urinary bladder is unremarkable. Stomach/Bowel: There is mild circumferential wall thickening of the distal esophagus. Stomach appears normal. The appendix is visualized and appears normal. No bowel wall thickening, inflammation, or distension. Liquid school stool noted throughout the colon which may reflect acute diarrheal illness. Vascular/Lymphatic: No aneurysm.  No abdominopelvic adenopathy. Reproductive: Status post hysterectomy. No adnexal masses. Other: No free fluid or fluid collections identified within the abdomen or pelvis. Musculoskeletal: Extensive postoperative changes associated with the proximal right hip and femur. Since the previous CT there has been interval removal of the right acetabular cup and prosthetic femoral head with IM nail in place as well as multiple cannulated screws in the right  iliac bone. Thoracolumbar scoliosis deformity identified with multilevel degenerative disc disease. IMPRESSION: 1. The appendix is visualized and appears normal. 2. Liquid school stool noted throughout the colon which may reflect acute diarrheal illness. 3. Mild circumferential wall thickening of the distal esophagus is identified. Correlate for any clinical signs or symptoms of  esophagitis. Electronically Signed   By: Signa Kell M.D.   On: 05/05/2021 09:13    ____________________________________________   PROCEDURES  Procedure(s) performed (including Critical Care):  .1-3 Lead EKG Interpretation  Date/Time: 05/05/2021 12:34 PM Performed by: Concha Se, MD Authorized by: Concha Se, MD     Interpretation: normal     ECG rate:  90s   ECG rate assessment: normal     Rhythm: sinus rhythm     Ectopy: none     Conduction: normal   Comments:     Occasionally patient does have sinus tachycardia   ____________________________________________   INITIAL IMPRESSION / ASSESSMENT AND PLAN / ED COURSE  SHONTEL SANTEE was evaluated in Emergency Department on 05/05/2021 for the symptoms described in the history of present illness. She was evaluated in the context of the global COVID-19 pandemic, which necessitated consideration that the patient might be at risk for infection with the SARS-CoV-2 virus that causes COVID-19. Institutional protocols and algorithms that pertain to the evaluation of patients at risk for COVID-19 are in a state of rapid change based on information released by regulatory bodies including the CDC and federal and state organizations. These policies and algorithms were followed during the patient's care in the ED.    Patient comes in tachycardic with right lower quadrant pain nausea, vomiting, diarrhea.  Denies any history of abdominal surgeries.  Will get CT imaging to make sure no evidence of appendicitis, diverticulitis, abscess, perforation due to patient's pain.  This could just be gastroenteritis but given the prolonged course and severe symptoms we will get stool testing to make sure that is not C. difficile or other GI pathogen that would require treatment.  In the meantime we will give 1 dose of Dilaudid, IV Zofran, IV fluids while awaiting labs to evaluate for electrolyte abnormalities, AKI.  Given tachycardia will keep patient on the  cardiac monitor to evaluate for decompensation  9:17 AM evaluated patient.  Work-up is thus far reassuring although still pending CT imaging.  Patient is writhing in pain again with continued nausea.  We will give a small dose of droperidol to see if that helps patient's symptoms.   Patient feeling better after medications.  Labs are reassuring.  No evidence of UTI.  Patient is COVID-positive.  Her CT scan shows liquid stool throughout the colon which could be from a diarrheal illness but she is been unable to give a stool sample.  This could just be from the COVID.  No risk factors for C. difficile.  She does have a little bit of concern for esophagitis which I have prescribed her a PPI for and will have her follow-up with GI after her illness has resolved.   Patient is now day 6 of COVID illness and does not meet criteria for paxlovid but she is high risk due to her hypertension and being unvaccinated and her age therefore we discussed antibody treatment and she would like to proceed with that  Patient tolerating p.o. and feels comfortable with being discharged home.  Tolerated the antibody infusion well.           ____________________________________________   FINAL CLINICAL IMPRESSION(S) /  ED DIAGNOSES   Final diagnoses:  Esophagitis  Nausea vomiting and diarrhea  COVID-19      MEDICATIONS GIVEN DURING THIS VISIT:  Medications  0.9 %  sodium chloride infusion (has no administration in time range)  diphenhydrAMINE (BENADRYL) injection 50 mg (has no administration in time range)  famotidine (PEPCID) IVPB 20 mg premix (has no administration in time range)  methylPREDNISolone sodium succinate (SOLU-MEDROL) 125 mg/2 mL injection 125 mg (has no administration in time range)  albuterol (VENTOLIN HFA) 108 (90 Base) MCG/ACT inhaler 2 puff (has no administration in time range)  EPINEPHrine (EPI-PEN) injection 0.3 mg (has no administration in time range)  ondansetron (ZOFRAN)  injection 4 mg (4 mg Intravenous Given 05/05/21 0745)  sodium chloride 0.9 % bolus 1,000 mL (0 mLs Intravenous Stopped 05/05/21 1000)  HYDROmorphone (DILAUDID) injection 0.5 mg (0.5 mg Intravenous Given 05/05/21 0746)  iohexol (OMNIPAQUE) 350 MG/ML injection 75 mL (75 mLs Intravenous Contrast Given 05/05/21 0840)  droperidol (INAPSINE) 2.5 MG/ML injection 1.25 mg (1.25 mg Intravenous Given 05/05/21 0924)  pantoprazole (PROTONIX) injection 40 mg (40 mg Intravenous Given 05/05/21 0923)  bebtelovimab EUA injection SOLN 175 mg (175 mg Intravenous Given 05/05/21 1156)     ED Discharge Orders          Ordered    ondansetron (ZOFRAN ODT) 4 MG disintegrating tablet  Every 8 hours PRN        05/05/21 1238    pantoprazole (PROTONIX) 20 MG tablet  Daily        05/05/21 1238             Note:  This document was prepared using Dragon voice recognition software and may include unintentional dictation errors.    Concha Se, MD 05/05/21 1239

## 2021-12-25 ENCOUNTER — Ambulatory Visit: Payer: Self-pay | Admitting: *Deleted

## 2021-12-25 ENCOUNTER — Emergency Department
Admission: EM | Admit: 2021-12-25 | Discharge: 2021-12-25 | Disposition: A | Discharge status: Home / Self Care | Payer: Medicare PPO | Attending: Emergency Medicine | Admitting: Emergency Medicine

## 2021-12-25 ENCOUNTER — Emergency Department: Payer: Medicare PPO

## 2021-12-25 ENCOUNTER — Encounter: Payer: Self-pay | Admitting: Emergency Medicine

## 2021-12-25 ENCOUNTER — Other Ambulatory Visit: Payer: Self-pay

## 2021-12-25 DIAGNOSIS — R600 Localized edema: Secondary | ICD-10-CM | POA: Insufficient documentation

## 2021-12-25 DIAGNOSIS — W19XXXA Unspecified fall, initial encounter: Secondary | ICD-10-CM | POA: Insufficient documentation

## 2021-12-25 DIAGNOSIS — Z96641 Presence of right artificial hip joint: Secondary | ICD-10-CM | POA: Diagnosis not present

## 2021-12-25 DIAGNOSIS — R202 Paresthesia of skin: Secondary | ICD-10-CM | POA: Diagnosis not present

## 2021-12-25 DIAGNOSIS — R410 Disorientation, unspecified: Secondary | ICD-10-CM | POA: Insufficient documentation

## 2021-12-25 DIAGNOSIS — M7989 Other specified soft tissue disorders: Secondary | ICD-10-CM | POA: Diagnosis present

## 2021-12-25 LAB — URINALYSIS, ROUTINE W REFLEX MICROSCOPIC
Bacteria, UA: NONE SEEN
Bilirubin Urine: NEGATIVE
Glucose, UA: NEGATIVE mg/dL
Ketones, ur: NEGATIVE mg/dL
Nitrite: NEGATIVE
Protein, ur: NEGATIVE mg/dL
Specific Gravity, Urine: 1.014 (ref 1.005–1.030)
pH: 5 (ref 5.0–8.0)

## 2021-12-25 LAB — HEPATIC FUNCTION PANEL
ALT: 25 U/L (ref 0–44)
AST: 24 U/L (ref 15–41)
Albumin: 3.7 g/dL (ref 3.5–5.0)
Alkaline Phosphatase: 63 U/L (ref 38–126)
Bilirubin, Direct: 0.1 mg/dL (ref 0.0–0.2)
Indirect Bilirubin: 0.5 mg/dL (ref 0.3–0.9)
Total Bilirubin: 0.6 mg/dL (ref 0.3–1.2)
Total Protein: 6.9 g/dL (ref 6.5–8.1)

## 2021-12-25 LAB — BASIC METABOLIC PANEL
Anion gap: 6 (ref 5–15)
BUN: 13 mg/dL (ref 6–20)
CO2: 27 mmol/L (ref 22–32)
Calcium: 8.6 mg/dL — ABNORMAL LOW (ref 8.9–10.3)
Chloride: 106 mmol/L (ref 98–111)
Creatinine, Ser: 0.71 mg/dL (ref 0.44–1.00)
GFR, Estimated: >60 mL/min (ref >60)
Glucose, Bld: 96 mg/dL (ref 70–99)
Potassium: 4.1 mmol/L (ref 3.5–5.1)
Sodium: 139 mmol/L (ref 135–145)

## 2021-12-25 LAB — CBC
HCT: 41.7 % (ref 36.0–46.0)
Hemoglobin: 13.3 g/dL (ref 12.0–15.0)
MCH: 28.9 pg (ref 26.0–34.0)
MCHC: 31.9 g/dL (ref 30.0–36.0)
MCV: 90.5 fL (ref 80.0–100.0)
Platelets: 286 10*3/uL (ref 150–400)
RBC: 4.61 MIL/uL (ref 3.87–5.11)
RDW: 13.3 % (ref 11.5–15.5)
WBC: 7.9 10*3/uL (ref 4.0–10.5)
nRBC: 0 % (ref 0.0–0.2)

## 2021-12-25 LAB — T4, FREE: Free T4: 0.89 ng/dL (ref 0.61–1.12)

## 2021-12-25 LAB — BRAIN NATRIURETIC PEPTIDE: B Natriuretic Peptide: 194.2 pg/mL — ABNORMAL HIGH (ref 0.0–100.0)

## 2021-12-25 LAB — TROPONIN I (HIGH SENSITIVITY): Troponin I (High Sensitivity): 11 ng/L (ref <18)

## 2021-12-25 LAB — TSH: TSH: 1.527 u[IU]/mL (ref 0.350–4.500)

## 2021-12-25 NOTE — Telephone Encounter (Signed)
?Chief Complaint: requesting appt for bilateral leg swelling since waking from a nap today  ?Symptoms: right leg swelling greater than left feet to knees. C/o right foot numb and right fingertips numb. Reported blurred vision after waking from nap and denies blurred vision now  ?Frequency: today  ?Pertinent Negatives: Patient denies chest pain difficulty breathing, no fever, can walk, no weakness on right side.  ?Disposition: [x] ED /[] Urgent Care (no appt availability in office) / [] Appointment(In office/virtual)/ []  [facility]/ [] Home Care/ [] Refused Recommended Disposition /[]  Mobile Bus/ []  Follow-up with PCP ?Additional Notes:  ? ?Instructed patient to call 911 / go to ED now for evaluation. Patient has not taken BP medication in several days.  ? ? ? Reason for Disposition ? [1] Numbness (i.e., loss of sensation) of the face, arm / hand, or leg / foot on one side of the body AND [2] sudden onset AND [3] present now ? ?Answer Assessment - Initial Assessment Questions ?1. ONSET: "When did the swelling start?" (e.g., minutes, hours, days) ?    Started after waking up from nap .  ?2. LOCATION: "What part of the leg is swollen?"  "Are both legs swollen or just one leg?" ?    Both legs right greater than ?3. SEVERITY: "How bad is the swelling?" (e.g., localized; mild, moderate, severe) ? - Localized - small area of swelling localized to one leg ? - MILD pedal edema - swelling limited to foot and ankle, pitting edema < 1/4 inch (6 mm) deep, rest and elevation eliminate most or all swelling ? - MODERATE edema - swelling of lower leg to knee, pitting edema > 1/4 inch (6 mm) deep, rest and elevation only partially reduce swelling ? - SEVERE edema - swelling extends above knee, facial or hand swelling present  ?    na ?4. REDNESS: "Does the swelling look red or infected?" ?    na ?5. PAIN: "Is the swelling painful to touch?" If Yes, ask: "How painful is it?"   (Scale 1-10; mild, moderate or  severe) ?    na ?6. FEVER: "Do you have a fever?" If Yes, ask: "What is it, how was it measured, and when did it start?"  ?    na ?7. CAUSE: "What do you think is causing the leg swelling?" ?    Woke up with legs swollen  ?8. MEDICAL HISTORY: "Do you have a history of heart failure, kidney disease, liver failure, or cancer?" ?    na ?9. RECURRENT SYMPTOM: "Have you had leg swelling before?" If Yes, ask: "When was the last time?" "What happened that time?" ?    na ?10. OTHER SYMPTOMS: "Do you have any other symptoms?" (e.g., chest pain, difficulty breathing) ?      Right foot numb right fingers numb ?11. PREGNANCY: "Is there any chance you are pregnant?" "When was your last menstrual period?" ?      na ? ?Answer Assessment - Initial Assessment Questions ?1. SYMPTOM: "What is the main symptom you are concerned about?" (e.g., weakness, numbness) ?    Right foot and right fingers numb  ?2. ONSET: "When did this start?" (minutes, hours, days; while sleeping) ?    Just now after waking up  ?3. LAST NORMAL: "When was the last time you (the patient) were normal (no symptoms)?" ?    Prior to nap today  ?4. PATTERN "Does this come and go, or has it been constant since it started?"  "Is it present now?" ?  Na  ?5. CARDIAC SYMPTOMS: "Have you had any of the following symptoms: chest pain, difficulty breathing, palpitations?" ?    na ?6. NEUROLOGIC SYMPTOMS: "Have you had any of the following symptoms: headache, dizziness, vision loss, double vision, changes in speech, unsteady on your feet?" ?    Blurred vision after waking up and now gone, right foot numb, right fingertips numb  ?7. OTHER SYMPTOMS: "Do you have any other symptoms?" ?    Has not taken BP meds  ?8. PREGNANCY: "Is there any chance you are pregnant?" "When was your last menstrual period?" ?    na ? ?Protocols used: Leg Swelling and Edema-A-AH, Neurologic Deficit-A-AH ? ?

## 2021-12-25 NOTE — ED Provider Notes (Signed)
? ?[facility] ?Provider Note ? ? ? Event Date/Time  ? First MD Initiated Contact with Patient 12/25/21 2008   ?  (approximate) ? ? ?History  ? ?Edema ? ? ?HPI ? ?Alicia Moyer is a 61 y.o. female with history of hip replacement on the right with known shortened right leg who comes in with concerns for leg edema.  Patient reports that she woke up this morning noticed that she had some swelling in her left lower leg.  She reports having a prior hip surgery on the right and has difficulty moving it secondary to this prior surgery.  She denies any history of CHF.  Denies any shortness of breath, chest pain.  She does report that over the past few weeks she has had a little bit of tingling in her bilateral fingertips and that she has had 2 falls and some memory lapses..  She denies any abdominal pain or any other concerns ? ? ?Physical Exam  ? ?Triage Vital Signs: ?ED Triage Vitals  ?Enc Vitals Group  ?   BP 12/25/21 2012 (!) 172/107  ?   Pulse Rate 12/25/21 2012 80  ?   Resp 12/25/21 2012 18  ?   Temp 12/25/21 2012 98.6 ?F (37 ?C)  ?   Temp Source 12/25/21 2012 Oral  ?   SpO2 12/25/21 2012 98 %  ?   Weight 12/25/21 1825 145 lb 1 oz (65.8 kg)  ?   Height 12/25/21 1825 5\' 3"  (1.6 m)  ?   Head Circumference --   ?   Peak Flow --   ?   Pain Score 12/25/21 1824 0  ?   Pain Loc --   ?   Pain Edu? --   ?   Excl. in GC? --   ? ? ?Most recent vital signs: ?Vitals:  ? 12/25/21 2012  ?BP: (!) 172/107  ?Pulse: 80  ?Resp: 18  ?Temp: 98.6 ?F (37 ?C)  ?SpO2: 98%  ? ? ? ?General: Awake, no distress.  ?CV:  Good peripheral perfusion.  ?Resp:  Normal effort.  ?Abd:  No distention.  ?Other:  Right leg is shortened and rotated but this is baseline due to her prior hip replacements.  She is unable to lift this leg fully up off the bed but again this is baseline.  She is got sensation intact in her bilateral legs.  Left leg has good strength.  Equal strength in her arms sensation intact bilaterally.  Cranial nerves II  through XII are intact.  She is got some 1+ edema in the mid calf mostly on the left.  There is no redness erythema or warmth.  No swelling of the joints.  2+ distal pulses. ? ? ?ED Results / Procedures / Treatments  ? ?Labs ?(all labs ordered are listed, but only abnormal results are displayed) ?Labs Reviewed  ?BASIC METABOLIC PANEL - Abnormal; Notable for the following components:  ?    Result Value  ? Calcium 8.6 (*)   ? All other components within normal limits  ?URINALYSIS, ROUTINE W REFLEX MICROSCOPIC - Abnormal; Notable for the following components:  ? Color, Urine YELLOW (*)   ? APPearance HAZY (*)   ? Hgb urine dipstick LARGE (*)   ? Leukocytes,Ua MODERATE (*)   ? All other components within normal limits  ?BRAIN NATRIURETIC PEPTIDE - Abnormal; Notable for the following components:  ? B Natriuretic Peptide 194.2 (*)   ? All other components within normal limits  ?  CBC  ?HEPATIC FUNCTION PANEL  ?TSH  ?T4, FREE  ?CBG MONITORING, ED  ?TROPONIN I (HIGH SENSITIVITY)  ? ? ? ?EKG ? ?My interpretation of EKG: ?Normal sinus rate of 75 without any ST elevation or T wave inversions, normal intervals ? ? ?RADIOLOGY ?I have reviewed the xray personally and  no pulm edema ? ? ?PROCEDURES: ? ?Critical Care performed: No ? ?Procedures ? ? ?MEDICATIONS ORDERED IN ED: ?Medications - No data to display ? ? ?IMPRESSION / MDM / ASSESSMENT AND PLAN / ED COURSE  ?I reviewed the triage vital signs and the nursing notes. ?             ?               ? ?Differential diagnosis includes, but is not limited to, left leg greater than right leg swelling and some increasing confusion w with 2 falls but last over a week ago.  Will get CT head, CT cervical to evaluate for any intercranial hemorrhage, cervical fracture.  Labs to evaluate for any Electra abnormalities, AKI.  Urine evaluate for UTI.  BMP to evaluate for heart failure ? ?BMP is normal.  CBC is normal.  Urine with some RBCs in it but no flank pain to suggest kidney stones.  BNP  is elevated ? ? ?CT/xray /US negative went to update patient and was called to critical patient when I went to place dc papers and discuss with pt possible trial of lasix and follow up with heart failure team due to elevated BNP pt had eloped from the ED. Will place referal now.  ? ? ? ?FINAL CLINICAL IMPRESSION(S) / ED DIAGNOSES  ? ?Final diagnoses:  ?Leg swelling  ? ? ? ?Rx / DC Orders  ? ?ED Discharge Orders   ? ? None  ? ?  ? ? ? ?Note:  This document was prepared using Dragon voice recognition software and may include unintentional dictation errors. ?  ?Concha Se, MD ?12/26/21 [id number] ? ?

## 2021-12-25 NOTE — ED Triage Notes (Signed)
Pt comes into the ED via [facility] from home c/o lower leg swelling.  Pt has right hip surgery at baseline.  Pt denies any known injury and denies any pain.  PT states the swelling was noted after taking a nap today.  Pt denies any CHF.  ? ?80 HR ?99% RA ?171/100 ?

## 2021-12-25 NOTE — ED Notes (Signed)
Called lab and added labs ordered ?

## 2021-12-25 NOTE — ED Notes (Signed)
Pt out at desk, informed pt the provider was in an emergency and as soon as she was available she would be in to see pt.  Pt verbalized understanding, but pt's friend stated they were leaving at 2330 and to let the doctor know they left at that time ?

## 2021-12-25 NOTE — ED Notes (Signed)
Pt seen leaving department with her friend ?

## 2021-12-26 ENCOUNTER — Telehealth: Payer: Self-pay | Admitting: Emergency Medicine

## 2021-12-26 NOTE — Telephone Encounter (Signed)
Heart failure clinic follow up placed as referral given elevated BNP. ?

## 2021-12-31 ENCOUNTER — Other Ambulatory Visit: Payer: Self-pay

## 2021-12-31 ENCOUNTER — Encounter: Payer: Self-pay | Admitting: Family

## 2021-12-31 ENCOUNTER — Ambulatory Visit: Payer: Medicare PPO | Attending: Family | Admitting: Family

## 2021-12-31 VITALS — BP 162/94 | HR 80 | Resp 20 | Ht 63.0 in | Wt 153.0 lb

## 2021-12-31 DIAGNOSIS — I5030 Unspecified diastolic (congestive) heart failure: Secondary | ICD-10-CM | POA: Diagnosis not present

## 2021-12-31 DIAGNOSIS — Z96641 Presence of right artificial hip joint: Secondary | ICD-10-CM | POA: Insufficient documentation

## 2021-12-31 DIAGNOSIS — G8929 Other chronic pain: Secondary | ICD-10-CM | POA: Insufficient documentation

## 2021-12-31 DIAGNOSIS — I110 Hypertensive heart disease with heart failure: Secondary | ICD-10-CM | POA: Diagnosis present

## 2021-12-31 DIAGNOSIS — Z448 Encounter for fitting and adjustment of other external prosthetic devices: Secondary | ICD-10-CM | POA: Insufficient documentation

## 2021-12-31 DIAGNOSIS — I10 Essential (primary) hypertension: Secondary | ICD-10-CM | POA: Diagnosis not present

## 2021-12-31 DIAGNOSIS — I5032 Chronic diastolic (congestive) heart failure: Secondary | ICD-10-CM | POA: Diagnosis not present

## 2021-12-31 DIAGNOSIS — G894 Chronic pain syndrome: Secondary | ICD-10-CM

## 2021-12-31 MED ORDER — POTASSIUM CHLORIDE ER 10 MEQ PO TBCR: 10.0000 meq | EXTENDED_RELEASE_TABLET | Freq: Every day | ORAL | 5 refills | Status: DC

## 2021-12-31 MED ORDER — FUROSEMIDE 20 MG PO TABS: 20.0000 mg | ORAL_TABLET | Freq: Every day | ORAL | 5 refills | Status: DC

## 2021-12-31 MED ORDER — LOSARTAN POTASSIUM 25 MG PO TABS: 25.0000 mg | ORAL_TABLET | Freq: Every day | ORAL | 5 refills | Status: DC

## 2021-12-31 NOTE — Progress Notes (Signed)
Name: Alicia Moyer  DOB: [birth year]  MRN:  [id number] ? ?Alicia Moyer is a 61 year old female with a history of HTN, right hip replacement, chronic heart failure and chronic pain r/t hip replacement. ? ?Echo report from 04/16/18 reviewed and showed an EF of >55% along with mild LVH.  ? ?She presented to the ED 12/25/21 after noticing her left leg was swollen after a walk. Her BNP was elevated, but she left without being seen and was updated that her BNP was elevated and to follow up with the heart failure clinic. ? ?She presents today for her initial visit to the heart failure clinic with a chief complaint of gradual weight gain and left leg pedal edema. She states this has been occurring over the last two weeks. She denies CP, palpitations, SOB, dizziness, orthopnea, PND, cough or abdominal swelling.  ? ?She weighs herself at home and prior to her ED visit had noticed a weight gain of ~ 7 lbs. She previously was on losartan and a "fluid pill" but cannot remember why she stopped them other than she thought she no longer needed them. ? ?Past Medical History:  ?Diagnosis Date  ? Arthritis   ? CHF (congestive heart failure) (HCC)   ? Chronic pain   ? Hypertension   ? Scoliosis   ? ? ?Past Surgical History:  ?Procedure Laterality Date  ? Hip replacement right    ? ?Family History  ?Problem Relation Age of Onset  ? Aneurysm Other   ?      grandmother  ? ?Social History  ? ?Tobacco Use  ? Smoking status: Never  ? Smokeless tobacco: Not on file  ?Substance Use Topics  ? Alcohol use: Not on file  ? ?Allergies  ?Allergen Reactions  ? Clindamycin/Lincomycin   ?  Rash,swelling of face and neck  ? ?Prior to Admission medications   ?Medication Sig Start Date End Date Taking? Authorizing Provider  ?furosemide (LASIX) 20 MG tablet Take 1 tablet (20 mg total) by mouth daily. 12/31/21 03/31/22 Yes Delma Freeze, FNP  ?gabapentin (NEURONTIN) 600 MG tablet Take 1,200 mg by mouth 2 (two) times daily. 04/23/21  Yes [provider]   ?potassium chloride (KLOR-CON) 10 MEQ tablet Take 1 tablet (10 mEq total) by mouth daily. 12/31/21 03/31/22 Yes Delma Freeze, FNP  ?XTAMPZA ER 27 MG C12A Take 1 capsule by mouth 2 (two) times daily. 04/14/21  Yes [provider]  ?HYDROcodone-acetaminophen (NORCO) 10-325 MG tablet Take 1 tablet by mouth 3 (three) times daily as needed for pain. ?Patient not taking: Reported on 12/31/2021 02/07/21   [provider]  ?losartan (COZAAR) 25 MG tablet Take 1 tablet (25 mg total) by mouth daily. 12/31/21   Delma Freeze, FNP  ?NUCYNTA 75 MG tablet Take 75 mg by mouth 3 (three) times daily. ?Patient not taking: Reported on 12/31/2021 04/09/21   [provider]  ?ondansetron (ZOFRAN ODT) 4 MG disintegrating tablet Take 1 tablet (4 mg total) by mouth every 8 (eight) hours as needed for nausea or vomiting. ?Patient not taking: Reported on 12/31/2021 05/05/21   Concha Se, MD  ?pantoprazole (PROTONIX) 20 MG tablet Take 1 tablet (20 mg total) by mouth daily for 14 days. 05/05/21 05/19/21  Concha Se, MD  ?tiZANidine (ZANAFLEX) 4 MG tablet Take 4 mg by mouth 3 (three) times daily. ?Patient not taking: Reported on 12/31/2021 04/17/21   [provider]  ? ? Review of Systems  ?Constitutional: Negative.   ?  HENT: Negative.    ?Eyes:  Negative for blurred vision and double vision.  ?Respiratory:  Negative for cough and shortness of breath.   ?Cardiovascular:  Positive for leg swelling. Negative for chest pain, palpitations, orthopnea and PND.  ?Gastrointestinal: Negative.   ?Genitourinary: Negative.   ?Musculoskeletal:  Positive for falls, joint pain and myalgias.  ?Skin: Negative.   ?Neurological:  Negative for dizziness, weakness and headaches.  ?Endo/Heme/Allergies: Negative.   ?Psychiatric/Behavioral: Negative.    ? ?Physical Exam ?Vitals and nursing note reviewed.  ?Constitutional:   ?   General: She is not in acute distress. ?   Appearance: She is well-developed.  ?Cardiovascular:  ?   Rate and  Rhythm: Normal rate and regular rhythm.  ?   Heart sounds: No murmur heard. ?Pulmonary:  ?   Effort: Pulmonary effort is normal.  ?   Breath sounds: No wheezing, rhonchi or rales.  ?Abdominal:  ?   Palpations: Abdomen is soft.  ?Musculoskeletal:  ?   Right lower leg: No edema.  ?   Left lower leg: Edema (+2 pitting edema) present.  ?Skin: ?   General: Skin is warm and dry.  ?   Capillary Refill: Capillary refill takes less than 2 seconds.  ?Neurological:  ?   Mental Status: She is alert and oriented to person, place, and time.  ?  ?Lab Results  ?Component Value Date  ? CREATININE 0.71 12/25/2021  ? CREATININE 0.95 05/05/2021  ? CREATININE 0.92 07/15/2014  ?  ?Filed Weights  ? 12/31/21 1315  ?Weight: 153 lb (69.4 kg)  ?  ? ?  12/31/2021  ?  1:15 PM 12/25/2021  ?  8:30 PM 12/25/2021  ?  8:12 PM  ?Vitals with BMI  ?Height 5\' 3"     ?Weight 153 lbs    ?BMI 27.11    ?Systolic 162 184  ?Diastolic 94 115 107  ?Pulse 80 98 80  ?  ?Assessment and Plan: ? ?Heart failure with preserved EF and structural changes ?- NYHA I ?- euvolemic ?- weighing daily, discussed continued importance and to call the HF clinic with a gain of 2lbs/night or 5 lbs/week ?- encouraged her to call [facility] to set up a cardiology appt, previously went to [facility] and would like to get established with a provider at [facility] ?- adheres to low sodium diet, low sodium cookbook provided ?- restart her losartan 25 mg QD ?- start lasix 20 mg QD and potassium 10 mEq QD ?- will get labs at next visit ?- defer other GDMT until updated echo is obtained; scheduled for 01/08/22 ?- BNP 12/25/21 194 ? ?2. HTN ?- BP 162/94 ?- restart losartan 25 mg qd ?- BMP reviewed 12/25/21, Na 139, K 4.1, Cr 0.71, gfr > 60 ? ?3. Chronic pain ?- managed by pain clinic ?- gabapentin, xtampza ? ? ?Return 1 week after echo, 01/16/22, sooner if needed.  ? ? ?

## 2021-12-31 NOTE — Patient Instructions (Signed)
Continue to weigh daily. If you gain 2-3 lbs/night OR 5 lbs/week, call our office. ? ?Start taking losartan 25 mg every day. ? ?Start taking lasix 20 mg every day. ? ?Start taking potassium 1 tablet every day. ? ?We are scheduling an echo for you. ? ?We will see you back in this office a week after your echo. ? ?Call [facility] [phone] and tell them you want to get established with a cardiologist there.  ?

## 2022-01-08 ENCOUNTER — Ambulatory Visit
Admission: RE | Admit: 2022-01-08 | Discharge: 2022-01-08 | Disposition: A | Discharge status: Home / Self Care | Payer: Medicare PPO | Source: Ambulatory Visit | Attending: Family | Admitting: Family

## 2022-01-08 DIAGNOSIS — I110 Hypertensive heart disease with heart failure: Secondary | ICD-10-CM | POA: Diagnosis present

## 2022-01-08 DIAGNOSIS — I5032 Chronic diastolic (congestive) heart failure: Secondary | ICD-10-CM | POA: Insufficient documentation

## 2022-01-08 DIAGNOSIS — I081 Rheumatic disorders of both mitral and tricuspid valves: Secondary | ICD-10-CM | POA: Diagnosis not present

## 2022-01-08 LAB — ECHOCARDIOGRAM COMPLETE
AR max vel: 2.71 cm2
AV Area VTI: 2.96 cm2
AV Area mean vel: 2.37 cm2
AV Mean grad: 2 mmHg
AV Peak grad: 3.9 mmHg
Ao pk vel: 0.99 m/s
Area-P 1/2: 2.02 cm2
MV VTI: 2 cm2
S' Lateral: 2.5 cm

## 2022-01-08 NOTE — Progress Notes (Signed)
*  PRELIMINARY RESULTS* ?Echocardiogram ?2D Echocardiogram has been performed. ? ?Sareena Odeh, Dorene Sorrow ?01/08/2022, 11:28 AM ?

## 2022-01-16 ENCOUNTER — Ambulatory Visit: Payer: Medicare PPO | Admitting: Family

## 2022-01-16 ENCOUNTER — Telehealth: Payer: Self-pay | Admitting: Family

## 2022-01-16 NOTE — Telephone Encounter (Signed)
Patient did not show for her Heart Failure Clinic appointment on 01/16/22. Will attempt to reschedule.   ?

## 2022-01-23 ENCOUNTER — Ambulatory Visit (HOSPITAL_BASED_OUTPATIENT_CLINIC_OR_DEPARTMENT_OTHER): Payer: Medicare PPO | Admitting: Family

## 2022-01-23 ENCOUNTER — Encounter: Payer: Self-pay | Admitting: Family

## 2022-01-23 ENCOUNTER — Other Ambulatory Visit
Admission: RE | Admit: 2022-01-23 | Discharge: 2022-01-23 | Disposition: A | Discharge status: Home / Self Care | Payer: Medicare PPO | Source: Ambulatory Visit | Attending: Family | Admitting: Family

## 2022-01-23 VITALS — BP 144/91 | HR 45 | Resp 18 | Ht 64.0 in | Wt 149.1 lb

## 2022-01-23 DIAGNOSIS — S0083XA Contusion of other part of head, initial encounter: Secondary | ICD-10-CM | POA: Insufficient documentation

## 2022-01-23 DIAGNOSIS — I5032 Chronic diastolic (congestive) heart failure: Secondary | ICD-10-CM | POA: Diagnosis not present

## 2022-01-23 DIAGNOSIS — Z79899 Other long term (current) drug therapy: Secondary | ICD-10-CM | POA: Insufficient documentation

## 2022-01-23 DIAGNOSIS — Z96641 Presence of right artificial hip joint: Secondary | ICD-10-CM | POA: Insufficient documentation

## 2022-01-23 DIAGNOSIS — R5383 Other fatigue: Secondary | ICD-10-CM | POA: Diagnosis present

## 2022-01-23 DIAGNOSIS — R220 Localized swelling, mass and lump, head: Secondary | ICD-10-CM | POA: Insufficient documentation

## 2022-01-23 DIAGNOSIS — W19XXXA Unspecified fall, initial encounter: Secondary | ICD-10-CM | POA: Insufficient documentation

## 2022-01-23 DIAGNOSIS — G894 Chronic pain syndrome: Secondary | ICD-10-CM | POA: Diagnosis not present

## 2022-01-23 DIAGNOSIS — F419 Anxiety disorder, unspecified: Secondary | ICD-10-CM | POA: Insufficient documentation

## 2022-01-23 DIAGNOSIS — G8929 Other chronic pain: Secondary | ICD-10-CM | POA: Insufficient documentation

## 2022-01-23 DIAGNOSIS — Z9181 History of falling: Secondary | ICD-10-CM | POA: Insufficient documentation

## 2022-01-23 DIAGNOSIS — G479 Sleep disorder, unspecified: Secondary | ICD-10-CM | POA: Insufficient documentation

## 2022-01-23 DIAGNOSIS — I10 Essential (primary) hypertension: Secondary | ICD-10-CM | POA: Diagnosis not present

## 2022-01-23 DIAGNOSIS — M25551 Pain in right hip: Secondary | ICD-10-CM | POA: Insufficient documentation

## 2022-01-23 DIAGNOSIS — I110 Hypertensive heart disease with heart failure: Secondary | ICD-10-CM | POA: Insufficient documentation

## 2022-01-23 DIAGNOSIS — R450 Nervousness: Secondary | ICD-10-CM | POA: Insufficient documentation

## 2022-01-23 DIAGNOSIS — R0602 Shortness of breath: Secondary | ICD-10-CM | POA: Diagnosis not present

## 2022-01-23 LAB — BASIC METABOLIC PANEL
Anion gap: 6 (ref 5–15)
BUN: 24 mg/dL — ABNORMAL HIGH (ref 6–20)
CO2: 27 mmol/L (ref 22–32)
Calcium: 9.4 mg/dL (ref 8.9–10.3)
Chloride: 107 mmol/L (ref 98–111)
Creatinine, Ser: 0.97 mg/dL (ref 0.44–1.00)
GFR, Estimated: >60 mL/min (ref >60)
Glucose, Bld: 74 mg/dL (ref 70–99)
Potassium: 4.2 mmol/L (ref 3.5–5.1)
Sodium: 140 mmol/L (ref 135–145)

## 2022-01-23 NOTE — Patient Instructions (Signed)
Continue weighing daily and call for an overnight weight gain of 3 pounds or more or a weekly weight gain of more than 5 pounds.   If you have voicemail, please make sure your mailbox is cleaned out so that we may leave a message and please make sure to listen to any voicemails.     

## 2022-01-23 NOTE — Progress Notes (Signed)
? Patient ID: Alicia Moyer, female    DOB: Mar 05, 1961, 61 y.o.   MRN: [id number] ? ? ?Alicia Moyer is a 61 year old female with a history of HTN, right hip replacement, chronic heart failure and chronic pain r/t hip replacement. ? ?Echo report from 01/08/22 reviewed and showed an EF of 60-65% along with mild MR/TR. Echo report from 04/16/18 reviewed and showed an EF of >55% along with mild LVH.  ? ?She presented to the ED 12/25/21 after noticing her left leg was swollen after a walk. Her BNP was elevated, but she left without being seen and was updated that her BNP was elevated and to follow up with the heart failure clinic. ? ?She presents today for a follow-up visit to the heart failure clinic with a chief complaint of minimal fatigue upon moderate exertion. She describes this as chronic in nature. She has associated shortness of breath (d/t anxiety), pedal edema, palpitations, dizziness, easy bruising, chronic difficulty sleeping and worsening anxiety along with this. She denies any abdominal distention, chest pain, cough or weight gain.  ? ?Reports her biggest problem is her current anxiety. She says that she feels anxious and worried but can't pinpoint what is causing this. She says that when she's feeling anxious, she knows her shortness of breath worsens.  ? ?Had a recent fall and has swelling and bruising on the lateral side of her face below her right eye. Says that when she fell, she hit her face on something. Denies LOC.  ? ?Past Medical History:  ?Diagnosis Date  ? Arthritis   ? CHF (congestive heart failure) (HCC)   ? Chronic pain   ? Hypertension   ? Scoliosis   ? ?Past Surgical History:  ?Procedure Laterality Date  ? Hip replacement right    ? ?Family History  ?Problem Relation Age of Onset  ? Aneurysm Other   ?      grandmother  ? ?Social History  ? ?Tobacco Use  ? Smoking status: Never  ? Smokeless tobacco: Not on file  ?Substance Use Topics  ? Alcohol use: Not on file  ? ?Allergies  ?Allergen Reactions  ?  Clindamycin/Lincomycin   ?  Rash,swelling of face and neck  ? ?Prior to Admission medications   ?Medication Sig Start Date End Date Taking? Authorizing Provider  ?furosemide (LASIX) 20 MG tablet Take 1 tablet (20 mg total) by mouth daily. 12/31/21 03/31/22 Yes Delma FreezeHackney, Teshia Mahone A, FNP  ?gabapentin (NEURONTIN) 600 MG tablet Take 1,200 mg by mouth 2 (two) times daily. 04/23/21  Yes [provider]  ?losartan (COZAAR) 25 MG tablet Take 1 tablet (25 mg total) by mouth daily. 12/31/21  Yes Delma FreezeHackney, Burnis Halling A, FNP  ?pantoprazole (PROTONIX) 20 MG tablet Take 1 tablet (20 mg total) by mouth daily for 14 days. 05/05/21  Yes Concha SeFunke, Mary E, MD  ?potassium chloride (KLOR-CON) 10 MEQ tablet Take 1 tablet (10 mEq total) by mouth daily. 12/31/21 03/31/22 Yes Delma FreezeHackney, Manu Rubey A, FNP  ?HYDROcodone-acetaminophen (NORCO) 10-325 MG tablet Take 1 tablet by mouth 3 (three) times daily as needed for pain. ?Patient not taking: Reported on 12/31/2021 02/07/21   [provider]  ? ?Review of Systems  ?Constitutional:  Positive for fatigue. Negative for appetite change.  ?HENT:  Negative for congestion, postnasal drip and sore throat.   ?Eyes: Negative.   ?Respiratory:  Positive for shortness of breath (due to anxiety). Negative for chest tightness.   ?Cardiovascular:  Positive for palpitations and leg swelling (minimal). Negative  for chest pain.  ?Gastrointestinal:  Negative for abdominal distention and abdominal pain.  ?Endocrine: Negative.   ?Genitourinary: Negative.   ?Musculoskeletal:  Positive for arthralgias (right hip pain).  ?Skin:   ?     Swelling/bruising under right eye  ?Allergic/Immunologic: Negative.   ?Neurological:  Positive for dizziness. Negative for light-headedness.  ?Hematological:  Negative for adenopathy. Bruises/bleeds easily (under right eye after recent fall).  ?Psychiatric/Behavioral:  Positive for sleep disturbance (waking after a few hours; sleeping on 1 pillow along with HOB elevated). Negative for dysphoric mood.  The patient is nervous/anxious (worsening).   ? ?Vitals:  ? 01/23/22 1006  ?BP: (!) 144/91  ?Pulse: (!) 45  ?Resp: 18  ?SpO2: 100%  ?Weight: 149 lb 2 oz (67.6 kg)  ?Height: 5\' 4"  (1.626 m)  ? ?Wt Readings from Last 3 Encounters:  ?01/23/22 149 lb 2 oz (67.6 kg)  ?12/31/21 153 lb (69.4 kg)  ?12/25/21 145 lb 1 oz (65.8 kg)  ? ?Lab Results  ?Component Value Date  ? CREATININE 0.71 12/25/2021  ? CREATININE 0.95 05/05/2021  ? CREATININE 0.92 07/15/2014  ? ? ?Physical Exam ?Vitals and nursing note reviewed.  ?Constitutional:   ?   Appearance: She is well-developed.  ?HENT:  ?   Head: Normocephalic and atraumatic.  ?Cardiovascular:  ?   Rate and Rhythm: Regular rhythm. Bradycardia present.  ?Pulmonary:  ?   Effort: Pulmonary effort is normal. No respiratory distress.  ?   Breath sounds: No wheezing, rhonchi or rales.  ?Abdominal:  ?   Palpations: Abdomen is soft.  ?   Tenderness: There is abdominal tenderness.  ?Musculoskeletal:  ?   Cervical back: Normal range of motion and neck supple.  ?   Right lower leg: No tenderness. No edema.  ?   Left lower leg: No tenderness. No edema.  ?Skin: ?   Findings: Ecchymosis (under right eye) present.  ?   Comments: Swelling noted under right eye  ?Neurological:  ?   General: No focal deficit present.  ?   Mental Status: She is alert.  ?Psychiatric:     ?   Mood and Affect: Mood is anxious.     ?   Behavior: Behavior normal.  ? ?Assessment and Plan: ? ?Chronic Heart failure with preserved EF without structural changes- ?- NYHA II ?- euvolemic ?- weighing daily, discussed continued importance and to call the HF clinic with a gain of 2lbs/night or 5 lbs/week ?- weight down 4 pounds from last visit here 3 weeks ago ?- does not have cardiology f/u scheduled yet; NT called the office and was told they will call the patient; cardiology referral order placed ?- adheres to low sodium diet ?- will get labs today since losartan/ furosemide & potassium were started at last visit ?- BNP 12/25/21  194 ? ?2. HTN- ?- BP mildly elevated (144/91) ?- saw PCP (Feldpausch) 12/18/20 ?- BMP reviewed 12/25/21, Na 139, K 4.1, Cr 0.71, gfr > 60 ? ?3. Chronic pain- ?- managed by pain clinic ?- gabapentin ?- recently told by orthopaedics that there isn't anything they can do for her hip pain anymore ? ?4: Anxiety- ?- reports feeling more anxious but she doesn't know why ?- knows her shortness of breath is made worse by her anxiety ?- discuss with PCP in case she needs treatment ? ? ?Patient did not bring her medications nor a list. Each medication was verbally reviewed with the patient and she was encouraged to bring the bottles to every visit  to confirm accuracy of list.  ? ?Return in 2 months, sooner if needed.  ? ? ? ? ? ?

## 2022-01-24 ENCOUNTER — Encounter: Payer: Self-pay | Admitting: Cardiology

## 2022-01-24 ENCOUNTER — Ambulatory Visit (INDEPENDENT_AMBULATORY_CARE_PROVIDER_SITE_OTHER): Payer: Medicare PPO | Admitting: Cardiology

## 2022-01-24 ENCOUNTER — Telehealth: Payer: Self-pay

## 2022-01-24 VITALS — BP 140/90 | HR 87 | Ht 64.0 in | Wt 147.0 lb

## 2022-01-24 DIAGNOSIS — R600 Localized edema: Secondary | ICD-10-CM | POA: Diagnosis not present

## 2022-01-24 DIAGNOSIS — I10 Essential (primary) hypertension: Secondary | ICD-10-CM

## 2022-01-24 NOTE — Telephone Encounter (Addendum)
Left voicemail for patient about lab results and informed patient to call the clinic if any questions or concerns arise. ?Suanne Marker, RN ?----- Message from Delma Freeze, FNP sent at 01/24/2022  9:16 AM EDT ----- ?Please call and inform patient that her labs look good.  ?

## 2022-01-24 NOTE — Patient Instructions (Signed)

## 2022-01-24 NOTE — Progress Notes (Signed)
?Cardiology Office Note:   ? ?Date:  01/24/2022  ? ?ID:  Alicia DenseKathy S Davids, DOB Jun 17, 1961, MRN [id number] ? ?PCP:  Marina GoodellFeldpausch, Dale E, MD ?  ?[facility] Providers ?Cardiologist:  None    ? ?Referring MD: Delma FreezeHackney, Tina A, FNP  ? ?Chief Complaint  ?Patient presents with  ? Other  ?  Referred by Clarisa Kindredina Hackney for Chromic diastolic HE. Meds reviewed verbally with patient.   ? ? ?History of Present Illness:   ? ?Alicia Moyer is a 61 y.o. female with a hx of hypertension, right hip fracture, who presents due to leg edema. ? ?Patient had symptoms of leg edema about 2 months ago.  Was seen in the ED, started on diuretics with improvement in symptoms.  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  Subsequently underwent right hip replacement in adulthood.  Her right hip unfortunately fractured leading to chronic pain.  Has seen multiple orthopedic specialist and was told there is nothing to do.  Takes Norco chronically for pain.  Blood pressures were elevated, started on losartan with improvement.  States having history of low blood pressures in the past.  She fell about a week ago, was not using a cane/assistive walking device at the time.  Son has history of bicuspid aortic valve. ? ?Echocardiogram obtained 01/08/2022 showed normal EF 60 to 65%.  Diastolic function appears normal, aortic valve not well visualized. ? ?Past Medical History:  ?Diagnosis Date  ? Arthritis   ? CHF (congestive heart failure) (HCC)   ? Chronic pain   ? Hypertension   ? Scoliosis   ? ? ?Past Surgical History:  ?Procedure Laterality Date  ? Hip replacement right    ? ? ?Current Medications: ?Current Meds  ?Medication Sig  ? furosemide (LASIX) 20 MG tablet Take 1 tablet (20 mg total) by mouth daily.  ? gabapentin (NEURONTIN) 600 MG tablet Take 1,200 mg by mouth 2 (two) times daily.  ? HYDROcodone-acetaminophen (NORCO) 10-325 MG tablet Take 1 tablet by mouth 3 (three) times daily as needed for pain.  ? losartan (COZAAR) 25 MG tablet Take 1 tablet (25 mg  total) by mouth daily.  ? pantoprazole (PROTONIX) 20 MG tablet Take 1 tablet (20 mg total) by mouth daily for 14 days.  ? potassium chloride (KLOR-CON) 10 MEQ tablet Take 1 tablet (10 mEq total) by mouth daily.  ?  ? ?Allergies:   Clindamycin/lincomycin  ? ?Social History  ? ?Socioeconomic History  ? Marital status: Married  ?  Spouse name: Not on file  ? Number of children: Not on file  ? Years of education: Not on file  ? Highest education level: Not on file  ?Occupational History  ? Not on file  ?Tobacco Use  ? Smoking status: Never  ? Smokeless tobacco: Not on file  ?Substance and Sexual Activity  ? Alcohol use: Not on file  ? Drug use: Not on file  ? Sexual activity: Not on file  ?Other Topics Concern  ? Not on file  ?Social History Narrative  ? Not on file  ? ?Social Determinants of Health  ? ?Financial Resource Strain: Not on file  ?Food Insecurity: Not on file  ?Transportation Needs: Not on file  ?Physical Activity: Not on file  ?Stress: Not on file  ?Social Connections: Not on file  ?  ? ?Family History: ?The patient's family history includes Aneurysm in an other family member. ? ?ROS:   ?Please see the history of present illness.    ?  All other systems reviewed and are negative. ? ?EKGs/Labs/Other Studies Reviewed:   ? ?The following studies were reviewed today: ? ? ?EKG:  EKG not  ordered today.   ? ?Recent Labs: ?05/05/2021: Magnesium 2.2 ?12/25/2021: ALT 25; B Natriuretic Peptide 194.2; Hemoglobin 13.3; Platelets 286; TSH 1.527 ?01/23/2022: BUN 24; Creatinine, Ser 0.97; Potassium 4.2; Sodium 140  ?Recent Lipid Panel ?   ?Component Value Date/Time  ? CHOL 155 07/15/2014 0406  ? TRIG 103 07/15/2014 0406  ? HDL 47 07/15/2014 0406  ? CHOLHDL 3.3 07/15/2014 0406  ? VLDL 21 07/15/2014 0406  ? LDLCALC 87 07/15/2014 0406  ? ? ? ?Risk Assessment/Calculations:   ? ? ?    ? ?Physical Exam:   ? ?VS:  BP 140/90 (BP Location: Left Arm, Patient Position: Sitting, Cuff Size: Normal)   Pulse 87   Ht 5\' 4"  (1.626 m)   Wt  147 lb (66.7 kg)   SpO2 98%   BMI 25.23 kg/m?    ? ?Wt Readings from Last 3 Encounters:  ?01/24/22 147 lb (66.7 kg)  ?01/23/22 149 lb 2 oz (67.6 kg)  ?12/31/21 153 lb (69.4 kg)  ?  ? ?GEN:  Well nourished, well developed in no acute distress ?HEENT: Normal ?NECK: No JVD; No carotid bruits ?LYMPHATICS: No lymphadenopathy ?CARDIAC: RRR, no murmurs, rubs, gallops ?RESPIRATORY:  Clear to auscultation without rales, wheezing or rhonchi  ?ABDOMEN: Soft, non-tender, non-distended ?MUSCULOSKELETAL:  No edema; No deformity  ?SKIN: Warm and dry ?NEUROLOGIC:  Alert and oriented x 3 ?PSYCHIATRIC:  Normal affect  ? ?ASSESSMENT:   ? ?1. Primary hypertension   ?2. Leg edema   ? ?PLAN:   ? ?In order of problems listed above: ? ?Hypertension, slightly elevated, much improved.  Continue losartan, Lasix.  Chronic pain likely contributing to higher blood pressures. ?Leg edema, resolved on Lasix.  Continue Lasix.  Echo shows normal EF, diastolic function appears normal upon my review. ? ?Follow-up yearly. ? ?   ? ?Medication Adjustments/Labs and Tests Ordered: ?Current medicines are reviewed at length with the patient today.  Concerns regarding medicines are outlined above.  ?No orders of the defined types were placed in this encounter. ? ?No orders of the defined types were placed in this encounter. ? ? ?Patient Instructions  ?Medication Instructions:  ? ?Your physician recommends that you continue on your current medications as directed. Please refer to the Current Medication list given to you today. ? ?*If you need a refill on your cardiac medications before your next appointment, please call your pharmacy* ? ? ?Lab Work: ?None ordered ?If you have labs (blood work) drawn today and your tests are completely normal, you will receive your results only by: ?MyChart Message (if you have MyChart) OR ?A paper copy in the mail ?If you have any lab test that is abnormal or we need to change your treatment, we will call you to review the  results. ? ? ?Testing/Procedures: ?None ordered ? ? ?Follow-Up: ?At [facility], you and your health needs are our priority.  As part of our continuing mission to provide you with exceptional heart care, we have created designated Provider Care Teams.  These Care Teams include your primary Cardiologist (physician) and Advanced Practice Providers (APPs -  Physician Assistants and Nurse Practitioners) who all work together to provide you with the care you need, when you need it. ? ?We recommend signing up for the patient portal called "MyChart".  Sign up information is provided on this After Visit Summary.  MyChart is used to connect with patients for Virtual Visits (Telemedicine).  Patients are able to view lab/test results, encounter notes, upcoming appointments, etc.  Non-urgent messages can be sent to your provider as well.   ?To learn more about what you can do with MyChart, go to [email or web address].   ? ?Your next appointment:   ?1 year(s) ? ?The format for your next appointment:   ?In Person ? ?Provider:   ?You may see Debbe Odea, MD or one of the following Advanced Practice Providers on your designated Care Team:   ?Nicolasa Ducking, NP ?Eula Listen, PA-C ?Cadence Fransico Michael, PA-C  ? ? ?Other Instructions ? ? ?Important Information About Sugar ? ? ? ? ? ?  ? ?Signed, ?Debbe Odea, MD  ?01/24/2022 4:01 PM    ?[facility] ?

## 2022-02-04 ENCOUNTER — Ambulatory Visit: Payer: Medicare PPO | Admitting: Family

## 2022-03-19 ENCOUNTER — Encounter: Payer: Self-pay | Admitting: Family

## 2022-03-19 ENCOUNTER — Other Ambulatory Visit: Payer: Self-pay | Admitting: Family Medicine

## 2022-03-19 ENCOUNTER — Ambulatory Visit: Payer: Medicare PPO | Attending: Family | Admitting: Family

## 2022-03-19 VITALS — BP 125/79 | HR 89 | Resp 18 | Ht 64.0 in | Wt 146.1 lb

## 2022-03-19 DIAGNOSIS — I5032 Chronic diastolic (congestive) heart failure: Secondary | ICD-10-CM

## 2022-03-19 DIAGNOSIS — G894 Chronic pain syndrome: Secondary | ICD-10-CM

## 2022-03-19 DIAGNOSIS — I10 Essential (primary) hypertension: Secondary | ICD-10-CM | POA: Diagnosis not present

## 2022-03-19 DIAGNOSIS — I110 Hypertensive heart disease with heart failure: Secondary | ICD-10-CM | POA: Diagnosis present

## 2022-03-19 DIAGNOSIS — R1011 Right upper quadrant pain: Secondary | ICD-10-CM

## 2022-03-19 DIAGNOSIS — G8929 Other chronic pain: Secondary | ICD-10-CM | POA: Insufficient documentation

## 2022-03-19 DIAGNOSIS — F419 Anxiety disorder, unspecified: Secondary | ICD-10-CM | POA: Insufficient documentation

## 2022-03-19 DIAGNOSIS — Z96641 Presence of right artificial hip joint: Secondary | ICD-10-CM | POA: Diagnosis not present

## 2022-03-19 DIAGNOSIS — I509 Heart failure, unspecified: Secondary | ICD-10-CM | POA: Diagnosis present

## 2022-03-19 DIAGNOSIS — R0602 Shortness of breath: Secondary | ICD-10-CM | POA: Diagnosis not present

## 2022-03-19 NOTE — Progress Notes (Signed)
Patient ID: Alicia Moyer, female    DOB: 08/18/1961, 61 y.o.   MRN: [id number]   Alicia Moyer is a 61 year old female with a history of HTN, right hip replacement, chronic heart failure and chronic pain r/t hip replacement.  Echo report from 01/08/22 reviewed and showed an EF of 60-65% along with mild MR/TR. Echo report from 04/16/18 reviewed and showed an EF of >55% along with mild LVH.   She presented to the ED 12/25/21 after noticing her left leg was swollen after a walk. Her BNP was elevated, but she left without being seen and was updated that her BNP was elevated and to follow up with the heart failure clinic.  She presents today for a follow-up visit to the heart failure clinic with a chief complaint of minimal fatigue upon moderate exertion. Describes this as chronic in nature. Has associated decreased appetite, dizziness, intermittent palpitations, abdominal pain (possible gallbladder), difficulty sleeping (due to pain) and easy bruising along with this. She denies any abdominal distention, pedal edema, chest pain, shortness of breath or cough.   Says that she's been having RUQ abdominal pain that radiates toward her back. Was told >10 years ago that she had gallbladder sludge present but no surgery was recommended. Has a pending abdominal ultrasound that her PCP has ordered.   Home BP runs 110's/70's  Past Medical History:  Diagnosis Date   Arthritis    CHF (congestive heart failure) (HCC)    Chronic pain    Hypertension    Scoliosis    Past Surgical History:  Procedure Laterality Date   Hip replacement right     Family History  Problem Relation Age of Onset   Aneurysm Other         grandmother   Social History   Tobacco Use   Smoking status: Never   Smokeless tobacco: Not on file  Substance Use Topics   Alcohol use: Not on file   Allergies  Allergen Reactions   Clindamycin/Lincomycin     Rash,swelling of face and neck   Prior to Admission medications   Medication  Sig Start Date End Date Taking? Authorizing Provider  furosemide (LASIX) 20 MG tablet Take 1 tablet (20 mg total) by mouth daily. 12/31/21 03/31/22 Yes Darlena Koval, Inetta Fermo A, FNP  gabapentin (NEURONTIN) 600 MG tablet Take 1,200 mg by mouth 2 (two) times daily. 04/23/21  Yes [provider]  losartan (COZAAR) 25 MG tablet Take 1 tablet (25 mg total) by mouth daily. 12/31/21  Yes Delma Freeze, FNP  oxyCODONE ER (XTAMPZA ER) 27 MG C12A Xtampza ER 27 mg capsule sprinkle  1 BID Fill on 05/19/2022   Yes [provider]  potassium chloride (KLOR-CON) 10 MEQ tablet Take 1 tablet (10 mEq total) by mouth daily. 12/31/21 03/31/22 Yes Delma Freeze, FNP  tapentadol HCl (NUCYNTA) 75 MG tablet Nucynta 75 mg tablet  TK 1 T PO TID . Fill on 05/06/2022   Yes [provider]    Review of Systems  Constitutional:  Positive for appetite change (decreased) and fatigue.  HENT:  Negative for congestion, postnasal drip and sore throat.   Eyes: Negative.   Respiratory:  Negative for cough, chest tightness and shortness of breath.   Cardiovascular:  Positive for palpitations. Negative for chest pain and leg swelling.  Gastrointestinal:  Positive for abdominal pain (RUQ pain radiating towards the back). Negative for abdominal distention and nausea.  Endocrine: Negative.   Genitourinary:  Positive for dysuria.  Musculoskeletal:  Positive for arthralgias (right hip pain).  Skin:           Allergic/Immunologic: Negative.   Neurological:  Positive for dizziness. Negative for light-headedness.  Hematological:  Negative for adenopathy. Bruises/bleeds easily (under right eye after recent fall).  Psychiatric/Behavioral:  Positive for sleep disturbance (slept well last night). Negative for dysphoric mood. The patient is not nervous/anxious.    Vitals:   03/19/22 1233  BP: 125/79  Pulse: 89  Resp: 18  SpO2: 100%  Weight: 146 lb 2 oz (66.3 kg)  Height: 5\' 4"  (1.626 m)   Wt Readings from Last 3  Encounters:  03/19/22 146 lb 2 oz (66.3 kg)  01/24/22 147 lb (66.7 kg)  01/23/22 149 lb 2 oz (67.6 kg)   Lab Results  Component Value Date   CREATININE 0.97 01/23/2022   CREATININE 0.71 12/25/2021   CREATININE 0.95 05/05/2021   Physical Exam Vitals and nursing note reviewed.  Constitutional:      Appearance: She is well-developed.  HENT:     Head: Normocephalic and atraumatic.  Cardiovascular:     Rate and Rhythm: Normal rate and regular rhythm.  Pulmonary:     Effort: Pulmonary effort is normal. No respiratory distress.     Breath sounds: No wheezing, rhonchi or rales.  Abdominal:     Palpations: Abdomen is soft.     Tenderness: There is abdominal tenderness.  Musculoskeletal:     Cervical back: Normal range of motion and neck supple.     Right lower leg: No tenderness. No edema.     Left lower leg: No tenderness. No edema.  Neurological:     General: No focal deficit present.     Mental Status: She is alert.  Psychiatric:        Mood and Affect: Mood is not anxious.        Behavior: Behavior normal.    Assessment and Plan:  Chronic Heart failure with preserved EF without structural changes- - NYHA II - euvolemic - weighing daily, discussed continued importance and to call the HF clinic with a gain of 2lbs/night or 5 lbs/week - weight down 3 pounds from last visit here 2 months ago - saw cardiology (Agbor-Etang) 01/19/22 - adheres to low sodium diet - BNP 12/25/21 194 - PharmD reconciled medications with the patient  2. HTN- - BP looks good (125/79) - saw PCP (Feldpausch) 02/25/22 - BMP reviewed 01/23/22 and showed sodium 140, potassium 4.2, creatinine 0.97 & GFR >60  3. Chronic pain- - managed by pain clinic - gabapentin - recently told by orthopaedics that there isn't anything they can do for her hip pain anymore  4: Anxiety- - reports feeling more anxious but she doesn't know why - knows her shortness of breath is made worse by her anxiety - currently  having RUQ pain possibly related to her gallbladder which is making her a little bit anxious   Patient did not bring her medications nor a list. Each medication was verbally reviewed with the patient and she was encouraged to bring the bottles to every visit to confirm accuracy of list.   Due to HF stability, will not make a return appointment at this time. Advised patient that she could call back at anytime to ask any questions or to schedule another appointment. She was comfortable with this plan.

## 2022-03-19 NOTE — Patient Instructions (Addendum)
Continue weighing daily and call for an overnight weight gain of 3 pounds or more or a weekly weight gain of more than 5 pounds.   If you have voicemail, please make sure your mailbox is cleaned out so that we may leave a message and please make sure to listen to any voicemails.    If you receive a satisfaction survey regarding the Heart Failure Clinic, please take the time to fill it out. This way we can continue to provide excellent care and make any changes that need to be made.    Call us in the future if you need us for anything 

## 2022-03-19 NOTE — Progress Notes (Signed)
[facility] - HEART FAILURE CLINIC - PHARMACIST COUNSELING NOTE  Guideline-Directed Medical Therapy/Evidence Based Medicine  ACE/ARB/ARNI: Losartan 25 mg daily Beta Blocker: None Aldosterone Antagonist: None Diuretic: Furosemide 20 mg daily SGLT2i: None  Adherence Assessment  Do you ever forget to take your medication? [] Yes [x] No  Do you ever skip doses due to side effects? [] Yes [x] No  Do you have trouble affording your medicines? [] Yes [x] No  Are you ever unable to pick up your medication due to transportation difficulties? [] Yes [x] No  Do you ever stop taking your medications because you don't believe they are helping? [x] Yes [] No  Do you check your weight daily? [] Yes [x] No   Adherence strategy: associates with time (AM)  Barriers to obtaining medications: none  Vital signs: HR 89, BP 125/79, weight (pounds) 146 lb ECHO: Date 03/19/2022, EF 60-65%, notes: grade II diastolic dysfunction      Latest Ref Rng & Units 01/23/2022   11:09 AM 12/25/2021    6:33 PM 05/05/2021    7:45 AM  BMP  Glucose 70 - 99 mg/dL 74  96    BUN 6 - 20 mg/dL 24  13  28    Creatinine 0.44 - 1.00 mg/dL     Sodium 135 - 145 mmol/L 140  139  137   Potassium 3.5 - 5.1 mmol/L 4.2  4.1  3.5   Chloride 98 - 111 mmol/L 107  106  97   CO2 22 - 32 mmol/L 27  27  27    Calcium 8.9 - 10.3 mg/dL 9.4  8.6      Past Medical History:  Diagnosis Date   Arthritis    CHF (congestive heart failure) (HCC)    Chronic pain    Hypertension    Scoliosis     ASSESSMENT 61 year old female who presents to the HF clinic for HFpEF management. Past medical history includes arthritis, chronic pain, and hypertension. Relatively new diagnosis of HFpEF after presenting to the ED in March 2023 with leg swelling, found to have elevated BNP. ECHO from 01/2022 revealed EF 60-65% and grade II diastolic dysfunction. Blood pressure in clinic today is 125/74. Current GDMT includes  losartan 25 mg daily, furosemide 20 mg daily, and potassium chloride 10 meq daily. Reports doing well overall, other than pain in abdominal region for which she saw PCP this morning. Reports that she has missed 1 dose of losartan in past week, but she states that losartan has been subjectively helping with her comorbid pain.  Recent ED Visit (past 6 months): Date - March 2022 CC - lower extremity edema  01/23/2022 BMP:  K: 4.2 (goal > 4), Scr 0.97 (BL 0.7), GFR > 60  PLAN Educated on the benefits of losartan (e.g., cardiac remodeling, prevention of hospitalization for heart failure) and emphasized adherence. Reconciled medications. Added Xtampza and Nucynta to medication history (found via reconcile medication report). Blood pressure controlled for HFpEF GDMT. Recommend continuing losartan 25 mg daily and furosemide 20 mg daily with KCl 10 meq daily. PCP ordered ultrasound of gallbladder which is scheduled for this afternoon.  Time spent: 15 minutes  12/27/2021, PharmD Pharmacy Resident  03/19/2022 1:36 PM  Current Outpatient Medications:    furosemide (LASIX) 20 MG tablet, Take 1 tablet (20 mg total) by mouth daily., Disp: 30 tablet, Rfl: 5   gabapentin (NEURONTIN) 600 MG tablet, Take 1,200 mg by mouth 2 (two) times daily., Disp: , Rfl:    losartan (COZAAR) 25  MG tablet, Take 1 tablet (25 mg total) by mouth daily., Disp: 30 tablet, Rfl: 5   oxyCODONE ER (XTAMPZA ER) 27 MG C12A, Xtampza ER 27 mg capsule sprinkle  1 BID Fill on 05/19/2022, Disp: , Rfl:    potassium chloride (KLOR-CON) 10 MEQ tablet, Take 1 tablet (10 mEq total) by mouth daily., Disp: 30 tablet, Rfl: 5   tapentadol HCl (NUCYNTA) 75 MG tablet, Nucynta 75 mg tablet  TK 1 T PO TID . Fill on 05/06/2022, Disp: , Rfl:    COUNSELING POINTS/CLINICAL PEARLS   DRUGS TO CAUTION IN HEART FAILURE  Drug or Class Mechanism  Analgesics NSAIDs COX-2 inhibitors Glucocorticoids  Sodium and water retention, increased  systemic vascular resistance, decreased response to diuretics   Diabetes Medications Metformin Thiazolidinediones Rosiglitazone (Avandia) Pioglitazone (Actos) DPP4 Inhibitors Saxagliptin (Onglyza) Sitagliptin (Januvia)   Lactic acidosis Possible calcium channel blockade   Unknown  Antiarrhythmics Class I  Flecainide Disopyramide Class III Sotalol Other Dronedarone  Negative inotrope, proarrhythmic   Proarrhythmic, beta blockade  Negative inotrope  Antihypertensives Alpha Blockers Doxazosin Calcium Channel Blockers Diltiazem Verapamil Nifedipine Central Alpha Adrenergics Moxonidine Peripheral Vasodilators Minoxidil  Increases renin and aldosterone  Negative inotrope    Possible sympathetic withdrawal  Unknown  Anti-infective Itraconazole Amphotericin B  Negative inotrope Unknown  Hematologic Anagrelide Cilostazol   Possible inhibition of PD IV Inhibition of PD III causing arrhythmias  Neurologic/Psychiatric Stimulants Anti-Seizure Drugs Carbamazepine Pregabalin Antidepressants Tricyclics Citalopram Parkinsons Bromocriptine Pergolide Pramipexole Antipsychotics Clozapine Antimigraine Ergotamine Methysergide Appetite suppressants Bipolar Lithium  Peripheral alpha and beta agonist activity  Negative inotrope and chronotrope Calcium channel blockade  Negative inotrope, proarrhythmic Dose-dependent QT prolongation  Excessive serotonin activity/valvular damage Excessive serotonin activity/valvular damage Unknown  IgE mediated hypersensitivy, calcium channel blockade  Excessive serotonin activity/valvular damage Excessive serotonin activity/valvular damage Valvular damage  Direct myofibrillar degeneration, adrenergic stimulation  Antimalarials Chloroquine Hydroxychloroquine Intracellular inhibition of lysosomal enzymes  Urologic Agents Alpha Blockers Doxazosin Prazosin Tamsulosin Terazosin  Increased renin and  aldosterone  Adapted from Page Williemae Natter, et al. "Drugs That May Cause or Exacerbate Heart Failure: A Scientific Statement from the American Heart  Association." Circulation 2016; 134:e32-e69. DOI: 10.1161/CIR.0000000000000426   MEDICATION ADHERENCES TIPS AND STRATEGIES Taking medication as prescribed improves patient outcomes in heart failure (reduces hospitalizations, improves symptoms, increases survival) Side effects of medications can be managed by decreasing doses, switching agents, stopping drugs, or adding additional therapy. Please let someone in the Heart Failure Clinic know if you have having bothersome side effects so we can modify your regimen. Do not alter your medication regimen without talking to [place].  Medication reminders can help patients remember to take drugs on time. If you are missing or forgetting doses you can try linking behaviors, using pill boxes, or an electronic reminder like an alarm on your phone or an app. Some people can also get automated phone calls as medication reminders.

## 2022-03-21 ENCOUNTER — Ambulatory Visit: Payer: Medicare PPO

## 2022-03-25 ENCOUNTER — Ambulatory Visit
Admission: RE | Admit: 2022-03-25 | Discharge: 2022-03-25 | Disposition: A | Discharge status: Home / Self Care | Payer: Medicare PPO | Source: Ambulatory Visit | Attending: Family Medicine | Admitting: Family Medicine

## 2022-03-25 DIAGNOSIS — R1011 Right upper quadrant pain: Secondary | ICD-10-CM

## 2022-04-01 ENCOUNTER — Other Ambulatory Visit: Payer: Medicare PPO

## 2022-04-09 ENCOUNTER — Ambulatory Visit: Payer: Self-pay | Admitting: Surgery

## 2022-04-09 NOTE — H&P (View-Only) (Signed)
Subjective:  CC: Biliary colic [K80.50]  HPI:  Alicia Moyer is a 60 y.o. female who was referred by  for evaluation of above CC. Symptoms were first noted 1 month ago. Pain is sharp,, confined to the right upper quadrant, without radiation.  Associated with nausea, exacerbated by greasy food.     Past Medical History:  has a past medical history of Capsulitis, Chickenpox, Fibromyalgia, Hip problem, Inflammatory arthritis, Inflammatory polyarthropathies (CMS-HCC), Inguinal adenopathy, Lumbago, Murmur, unspecified, Neuroma, Osteoarthritis, Peripheral edema, Rheumatoid arthritis (CMS-HCC), and Scoliosis.  Past Surgical History:  has a past surgical history that includes right hip replacement (1991, 1997, 2007, 2009, 2010, 2011); knee growth plate surgery (Left, Age 10); Hysterectomy (1999); and Tonsillectomy.  Family History: family history includes Cancer in her father; Cerebral aneurysm in her paternal grandmother; Dementia in her mother; Heart defect in her brother; High blood pressure (Hypertension) in her father and paternal grandmother; Hyperthyroidism in her mother; Nephrolithiasis in her son; No Known Problems in her brother, brother, and son; Obesity in her father; Osteoporosis (Thinning of bones) in her maternal grandmother; Other in her mother; Rheum arthritis in her maternal aunt.  Social History:  reports that she has never smoked. She has never used smokeless tobacco. She reports that she does not drink alcohol and does not use drugs.  Current Medications: has a current medication list which includes the following prescription(s): gabapentin, hydrocodone-acetaminophen, losartan, narcan, nucynta, tapentadol, tizanidine, xtampza er, benzonatate, and meloxicam.  Allergies:  Allergies as of 04/09/2022 - Reviewed 04/09/2022 Allergen Reaction Noted  Cleocin [clindamycin hcl] Anaphylaxis and Hives 10/11/2007  Morphine Nausea and Vomiting 09/23/2006  Percocet [oxycodone-acetaminophen] Nausea  and Vomiting 07/16/2007  Zolpidem Hallucination 04/09/2022   ROS:  A 15 point review of systems was performed and pertinent positives and negatives noted in HPI    Objective:    BP (!) 159/96   Pulse 50   Ht 162.6 cm (5' 4")   Wt 65.8 kg (145 lb)   BMI 24.89 kg/m    Constitutional :  No distress, cooperative, alert Lymphatics/Throat:  Supple with no lymphadenopathy Respiratory:  Clear to auscultation bilaterally Cardiovascular:  Regular rate and rhythm Gastrointestinal: Soft, RUQ TTP, non-distended, no organomegaly. Musculoskeletal: Steady gait and movement Skin: Cool and moist Psychiatric: Normal affect, non-agitated, not confused     LABS:  N/a   RADS: CLINICAL DATA:  Right upper quadrant pain.   EXAM:  ULTRASOUND ABDOMEN LIMITED RIGHT UPPER QUADRANT   COMPARISON:  None Available.   FINDINGS:  Gallbladder:   Multiple stones in the gallbladder lumen. No gallbladder wall  thickening or pericholecystic fluid. Negative sonographic Murphy  sign.   Common bile duct:   Diameter: 4 mm   Liver:   Increased echogenicity. No focal lesion. Portal vein is patent on  color Doppler imaging with normal direction of blood flow towards  the liver.   Other: None.   IMPRESSION:  Cholelithiasis without secondary signs of acute cholecystitis.   Increased hepatic parenchymal echogenicity suggestive of steatosis.    Electronically Signed    By: Drew  Davis M.D.  Assessment:     Biliary colic [K80.50]  Plan:    1. Biliary colic [K80.50] Discussed the risk of surgery including post-op infxn, seroma, biloma, chronic pain, poor-delayed wound healing, retained gallstone, conversion to open procedure, post-op SBO or ileus, and need for additional procedures to address said risks.  The risks of general anesthetic including MI, CVA, sudden death or even reaction to anesthetic medications also discussed. Alternatives include   continued observation.  Benefits include  possible symptom relief, prevention of complications including acute cholecystitis, pancreatitis.  Typical post operative recovery of 3-5 days rest, continued pain in area and incision sites, possible loose stools up to 4-6 weeks, also discussed.  ED return precautions given for sudden increase in RUQ pain, with possible accompanying fever, nausea, and/or vomiting.  The patient understands the risks, any and all questions were answered to the patient's satisfaction.  2. Patient has elected to proceed with surgical treatment. Procedure will be scheduled.  Written consent was obtained..robotic assisted laparoscopic  labs/images/medications/previous chart entries reviewed personally and relevant changes/updates noted above.

## 2022-04-09 NOTE — H&P (Signed)
Subjective:  CC: Biliary colic [K80.50]  HPI:  Alicia Moyer is a 61 y.o. female who was referred by  for evaluation of above CC. Symptoms were first noted 1 month ago. Pain is sharp,, confined to the right upper quadrant, without radiation.  Associated with nausea, exacerbated by greasy food.     Past Medical History:  has a past medical history of Capsulitis, Chickenpox, Fibromyalgia, Hip problem, Inflammatory arthritis, Inflammatory polyarthropathies (CMS-HCC), Inguinal adenopathy, Lumbago, Murmur, unspecified, Neuroma, Osteoarthritis, Peripheral edema, Rheumatoid arthritis (CMS-HCC), and Scoliosis.  Past Surgical History:  has a past surgical history that includes right hip replacement (1991, 1997, 2007, 2009, 2010, 2011); knee growth plate surgery (Left, Age 62); Hysterectomy (1999); and Tonsillectomy.  Family History: family history includes Cancer in her father; Cerebral aneurysm in her paternal grandmother; Dementia in her mother; Heart defect in her brother; High blood pressure (Hypertension) in her father and paternal grandmother; Hyperthyroidism in her mother; Nephrolithiasis in her son; No Known Problems in her brother, brother, and son; Obesity in her father; Osteoporosis (Thinning of bones) in her maternal grandmother; Other in her mother; Rheum arthritis in her maternal aunt.  Social History:  reports that she has never smoked. She has never used smokeless tobacco. She reports that she does not drink alcohol and does not use drugs.  Current Medications: has a current medication list which includes the following prescription(s): gabapentin, hydrocodone-acetaminophen, losartan, narcan, nucynta, tapentadol, tizanidine, xtampza er, benzonatate, and meloxicam.  Allergies:  Allergies as of 04/09/2022 - Reviewed 04/09/2022 Allergen Reaction Noted  Cleocin [clindamycin hcl] Anaphylaxis and Hives 10/11/2007  Morphine Nausea and Vomiting 09/23/2006  Percocet [oxycodone-acetaminophen] Nausea  and Vomiting 07/16/2007  Zolpidem Hallucination 04/09/2022   ROS:  A 15 point review of systems was performed and pertinent positives and negatives noted in HPI    Objective:    BP (!) 159/96   Pulse 50   Ht 162.6 cm (5\' 4" )   Wt 65.8 kg (145 lb)   BMI 24.89 kg/m    Constitutional :  No distress, cooperative, alert Lymphatics/Throat:  Supple with no lymphadenopathy Respiratory:  Clear to auscultation bilaterally Cardiovascular:  Regular rate and rhythm Gastrointestinal: Soft, RUQ TTP, non-distended, no organomegaly. Musculoskeletal: Steady gait and movement Skin: Cool and moist Psychiatric: Normal affect, non-agitated, not confused     LABS:  N/a   RADS: CLINICAL DATA:  Right upper quadrant pain.   EXAM:  ULTRASOUND ABDOMEN LIMITED RIGHT UPPER QUADRANT   COMPARISON:  None Available.   FINDINGS:  Gallbladder:   Multiple stones in the gallbladder lumen. No gallbladder wall  thickening or pericholecystic fluid. Negative sonographic Murphy  sign.   Common bile duct:   Diameter: 4 mm   Liver:   Increased echogenicity. No focal lesion. Portal vein is patent on  color Doppler imaging with normal direction of blood flow towards  the liver.   Other: None.   IMPRESSION:  Cholelithiasis without secondary signs of acute cholecystitis.   Increased hepatic parenchymal echogenicity suggestive of steatosis.    Electronically Signed    By: M.D.  Assessment:     Biliary colic [K80.50]  Plan:    1. Biliary colic [K80.50] Discussed the risk of surgery including post-op infxn, seroma, biloma, chronic pain, poor-delayed wound healing, retained gallstone, conversion to open procedure, post-op SBO or ileus, and need for additional procedures to address said risks.  The risks of general anesthetic including MI, CVA, sudden death or even reaction to anesthetic medications also discussed. Alternatives include  continued observation.  Benefits include  possible symptom relief, prevention of complications including acute cholecystitis, pancreatitis.  Typical post operative recovery of 3-5 days rest, continued pain in area and incision sites, possible loose stools up to 4-6 weeks, also discussed.  ED return precautions given for sudden increase in RUQ pain, with possible accompanying fever, nausea, and/or vomiting.  The patient understands the risks, any and all questions were answered to the patient's satisfaction.  2. Patient has elected to proceed with surgical treatment. Procedure will be scheduled.  Written consent was obtained..robotic assisted laparoscopic  labs/images/medications/previous chart entries reviewed personally and relevant changes/updates noted above.

## 2022-04-22 ENCOUNTER — Inpatient Hospital Stay: Admission: RE | Admit: 2022-04-22 | Payer: Medicare PPO | Source: Ambulatory Visit

## 2022-04-24 ENCOUNTER — Other Ambulatory Visit: Payer: Self-pay

## 2022-04-24 ENCOUNTER — Encounter
Admission: RE | Admit: 2022-04-24 | Discharge: 2022-04-24 | Disposition: A | Discharge status: Home / Self Care | Payer: Medicare PPO | Source: Ambulatory Visit | Attending: Surgery | Admitting: Surgery

## 2022-04-24 NOTE — Patient Instructions (Addendum)
Your procedure is scheduled on: 05/02/22 Report to DAY SURGERY DEPARTMENT LOCATED ON 2ND FLOOR MEDICAL MALL ENTRANCE. To find out your arrival time please call [phone] between 1PM - 3PM on 05/01/22 .  Remember: Instructions that are not followed completely may result in serious medical risk, up to and including death, or upon the discretion of your surgeon and anesthesiologist your surgery may need to be rescheduled.     _X__ 1. Do not eat food after midnight the night before your procedure.                 No gum chewing or hard candies. You may drink clear liquids up to 2 hours                 before you are scheduled to arrive for your surgery- DO not drink clear                 liquids within 2 hours of the start of your surgery.                 Clear Liquids include:  water, apple juice without pulp, clear carbohydrate                 drink such as Clearfast or Gatorade, Black Coffee or Tea (Do not add                 anything to coffee or tea). Diabetics water only  __X__2.  On the morning of surgery brush your teeth with toothpaste and water, you                 may rinse your mouth with mouthwash if you wish.  Do not swallow any              toothpaste of mouthwash.     _X__ 3.  No Alcohol for 24 hours before or after surgery.   _X__ 4.  Do Not Smoke or use e-cigarettes For 24 Hours Prior to Your Surgery.                 Do not use any chewable tobacco products for at least 6 hours prior to                 surgery.  ____  5.  Bring all medications with you on the day of surgery if instructed.   __X__  6.  Notify your doctor if there is any change in your medical condition      (cold, fever, infections).     Do not wear jewelry, make-up, hairpins, clips or nail polish. Do not wear lotions, powders, or perfumes.  Do not shave body hair 48 hours prior to surgery. Men may shave face and neck. Do not bring valuables to the hospital.    [facility] is not responsible for any  belongings or valuables.  Contacts, dentures/partials or body piercings may not be worn into surgery. Bring a case for your contacts, glasses or hearing aids, a denture cup will be supplied. Leave your suitcase in the car. After surgery it may be brought to your room. For patients admitted to the hospital, discharge time is determined by your treatment team.   Patients discharged the day of surgery will not be allowed to drive home.  __X__ Take these medicines the morning of surgery with A SIP OF WATER:    1. gabapentin (NEURONTIN) 1200 MG tablet  2. oxyCODONE ER (XTAMPZA ER) 27  MG C12A  3.   4.  5.  6.  ____ Fleet Enema (as directed)   ____ Use CHG Soap/SAGE wipes as directed  ____ Use inhalers on the day of surgery  ____ Stop metformin/Janumet/Farxiga 2 days prior to surgery    ____ Take 1/2 of usual insulin dose the night before surgery. No insulin the morning          of surgery.   ____ Stop Blood Thinners Coumadin/Plavix/Xarelto/Pleta/Pradaxa/Eliquis/Effient/Aspirin  on   Or contact your Surgeon, Cardiologist or Medical Doctor regarding  ability to stop your blood thinners  __X__ Stop Anti-inflammatories 7 days before surgery such as Advil, Ibuprofen, Motrin,  BC or Goodies Powder, Naprosyn, Naproxen, Aleve, Aspirin    __X__ Stop all herbals and supplements, fish oil or vitamins  until after surgery.    ____ Bring C-Pap to the hospital.

## 2022-05-02 ENCOUNTER — Ambulatory Visit: Payer: Medicare PPO | Admitting: General Practice

## 2022-05-02 ENCOUNTER — Other Ambulatory Visit: Payer: Self-pay

## 2022-05-02 ENCOUNTER — Encounter
Admission: RE | Disposition: A | Discharge status: Home / Self Care | Payer: Self-pay | Source: Home / Self Care | Attending: Surgery

## 2022-05-02 ENCOUNTER — Encounter: Payer: Self-pay | Admitting: Surgery

## 2022-05-02 ENCOUNTER — Ambulatory Visit
Admission: RE | Admit: 2022-05-02 | Discharge: 2022-05-02 | Disposition: A | Discharge status: Home / Self Care | Payer: Medicare PPO | Attending: Surgery | Admitting: Surgery

## 2022-05-02 DIAGNOSIS — K811 Chronic cholecystitis: Secondary | ICD-10-CM | POA: Diagnosis present

## 2022-05-02 DIAGNOSIS — M1990 Unspecified osteoarthritis, unspecified site: Secondary | ICD-10-CM | POA: Diagnosis not present

## 2022-05-02 DIAGNOSIS — Z96641 Presence of right artificial hip joint: Secondary | ICD-10-CM | POA: Diagnosis not present

## 2022-05-02 DIAGNOSIS — R45851 Suicidal ideations: Secondary | ICD-10-CM | POA: Diagnosis not present

## 2022-05-02 DIAGNOSIS — I110 Hypertensive heart disease with heart failure: Secondary | ICD-10-CM | POA: Insufficient documentation

## 2022-05-02 DIAGNOSIS — F411 Generalized anxiety disorder: Secondary | ICD-10-CM | POA: Diagnosis not present

## 2022-05-02 DIAGNOSIS — F430 Acute stress reaction: Secondary | ICD-10-CM | POA: Diagnosis not present

## 2022-05-02 DIAGNOSIS — G8929 Other chronic pain: Secondary | ICD-10-CM | POA: Diagnosis not present

## 2022-05-02 DIAGNOSIS — G8918 Other acute postprocedural pain: Secondary | ICD-10-CM | POA: Diagnosis not present

## 2022-05-02 DIAGNOSIS — K8010 Calculus of gallbladder with chronic cholecystitis without obstruction: Secondary | ICD-10-CM | POA: Insufficient documentation

## 2022-05-02 DIAGNOSIS — I509 Heart failure, unspecified: Secondary | ICD-10-CM | POA: Diagnosis not present

## 2022-05-02 DIAGNOSIS — F4389 Other reactions to severe stress: Secondary | ICD-10-CM | POA: Diagnosis not present

## 2022-05-02 SURGERY — CHOLECYSTECTOMY, ROBOT-ASSISTED, LAPAROSCOPIC
Anesthesia: General | Site: Abdomen

## 2022-05-02 MED ORDER — HYDROMORPHONE HCL 1 MG/ML IJ SOLN
INTRAMUSCULAR | Status: DC | PRN
  Administered 2022-05-02: 1 mg via INTRAVENOUS

## 2022-05-02 MED ORDER — FAMOTIDINE 20 MG PO TABS
ORAL_TABLET | ORAL | Status: AC
  Administered 2022-05-02: 20 mg via ORAL
  Filled 2022-05-02: qty 1

## 2022-05-02 MED ORDER — DOCUSATE SODIUM 100 MG PO CAPS: 100.0000 mg | ORAL_CAPSULE | Freq: Two times a day (BID) | ORAL | 0 refills | Status: AC | PRN

## 2022-05-02 MED ORDER — ORAL CARE MOUTH RINSE
15.0000 mL | Freq: Once | OROMUCOSAL | Status: AC
  Administered 2022-05-02: 15 mL via OROMUCOSAL

## 2022-05-02 MED ORDER — OXYCODONE HCL 5 MG/5ML PO SOLN: 5.0000 mg | Freq: Once | ORAL | Status: DC | PRN

## 2022-05-02 MED ORDER — FAMOTIDINE 20 MG PO TABS: 20.0000 mg | ORAL_TABLET | Freq: Once | ORAL | Status: AC

## 2022-05-02 MED ORDER — GABAPENTIN 400 MG PO CAPS
ORAL_CAPSULE | ORAL | Status: AC
  Administered 2022-05-02: 1200 mg via ORAL
  Filled 2022-05-02: qty 3

## 2022-05-02 MED ORDER — CHLORHEXIDINE GLUCONATE 0.12 % MT SOLN: 15.0000 mL | Freq: Once | OROMUCOSAL | Status: AC

## 2022-05-02 MED ORDER — MIDAZOLAM HCL 2 MG/2ML IJ SOLN
INTRAMUSCULAR | Status: AC
  Filled 2022-05-02: qty 2

## 2022-05-02 MED ORDER — PHENYLEPHRINE HCL-NACL 20-0.9 MG/250ML-% IV SOLN
INTRAVENOUS | Status: DC | PRN
  Administered 2022-05-02: 10 ug/min via INTRAVENOUS

## 2022-05-02 MED ORDER — OXYCODONE HCL 5 MG PO TABS: 5.0000 mg | ORAL_TABLET | Freq: Once | ORAL | Status: DC | PRN

## 2022-05-02 MED ORDER — HYDROMORPHONE HCL 1 MG/ML IJ SOLN: 0.5000 mg | Freq: Once | INTRAMUSCULAR | Status: AC | PRN

## 2022-05-02 MED ORDER — HYDROMORPHONE HCL 1 MG/ML IJ SOLN
INTRAMUSCULAR | Status: AC
  Administered 2022-05-02: 0.5 mg via INTRAVENOUS
  Filled 2022-05-02: qty 1

## 2022-05-02 MED ORDER — MIDAZOLAM HCL 2 MG/2ML IJ SOLN
INTRAMUSCULAR | Status: DC | PRN
  Administered 2022-05-02: 2 mg via INTRAVENOUS

## 2022-05-02 MED ORDER — CHLORHEXIDINE GLUCONATE 0.12 % MT SOLN
OROMUCOSAL | Status: AC
  Filled 2022-05-02: qty 15

## 2022-05-02 MED ORDER — FENTANYL CITRATE (PF) 100 MCG/2ML IJ SOLN
25.0000 ug | INTRAMUSCULAR | Status: DC | PRN
  Administered 2022-05-02: 50 ug via INTRAVENOUS

## 2022-05-02 MED ORDER — FENTANYL CITRATE (PF) 100 MCG/2ML IJ SOLN: 25.0000 ug | INTRAMUSCULAR | Status: DC | PRN

## 2022-05-02 MED ORDER — DEXAMETHASONE SODIUM PHOSPHATE 10 MG/ML IJ SOLN
INTRAMUSCULAR | Status: DC | PRN
  Administered 2022-05-02: 10 mg via INTRAVENOUS

## 2022-05-02 MED ORDER — GABAPENTIN 300 MG PO CAPS: 600.0000 mg | ORAL_CAPSULE | Freq: Once | ORAL | Status: DC

## 2022-05-02 MED ORDER — CEFAZOLIN SODIUM-DEXTROSE 2-4 GM/100ML-% IV SOLN
INTRAVENOUS | Status: AC
  Filled 2022-05-02: qty 100

## 2022-05-02 MED ORDER — ONDANSETRON HCL 4 MG/2ML IJ SOLN
INTRAMUSCULAR | Status: AC
Start: 2022-05-02 — End: ?
  Filled 2022-05-02: qty 2

## 2022-05-02 MED ORDER — HYDROMORPHONE HCL 1 MG/ML IJ SOLN
INTRAMUSCULAR | Status: AC
  Filled 2022-05-02: qty 1

## 2022-05-02 MED ORDER — ROCURONIUM BROMIDE 100 MG/10ML IV SOLN
INTRAVENOUS | Status: DC | PRN
  Administered 2022-05-02: 50 mg via INTRAVENOUS

## 2022-05-02 MED ORDER — OXYCODONE HCL 5 MG/5ML PO SOLN: 5.0000 mg | Freq: Once | ORAL | Status: AC | PRN

## 2022-05-02 MED ORDER — LACTATED RINGERS IV SOLN: INTRAVENOUS | Status: DC

## 2022-05-02 MED ORDER — DEXAMETHASONE SODIUM PHOSPHATE 10 MG/ML IJ SOLN
INTRAMUSCULAR | Status: AC
Start: 2022-05-02 — End: ?
  Filled 2022-05-02: qty 1

## 2022-05-02 MED ORDER — LORAZEPAM 2 MG/ML IJ SOLN
1.0000 mg | Freq: Once | INTRAMUSCULAR | Status: AC
  Administered 2022-05-02: 1 mg via INTRAVENOUS

## 2022-05-02 MED ORDER — IBUPROFEN 800 MG PO TABS: 800.0000 mg | ORAL_TABLET | Freq: Three times a day (TID) | ORAL | 0 refills | Status: DC | PRN

## 2022-05-02 MED ORDER — KETOROLAC TROMETHAMINE 30 MG/ML IJ SOLN
INTRAMUSCULAR | Status: DC | PRN
  Administered 2022-05-02: 30 mg via INTRAVENOUS

## 2022-05-02 MED ORDER — CHLORHEXIDINE GLUCONATE CLOTH 2 % EX PADS
6.0000 | MEDICATED_PAD | Freq: Once | CUTANEOUS | Status: AC
  Administered 2022-05-02: 6 via TOPICAL

## 2022-05-02 MED ORDER — PROPOFOL 10 MG/ML IV BOLUS
INTRAVENOUS | Status: DC | PRN
  Administered 2022-05-02: 150 mg via INTRAVENOUS

## 2022-05-02 MED ORDER — LIDOCAINE-EPINEPHRINE (PF) 1 %-1:200000 IJ SOLN
INTRAMUSCULAR | Status: DC | PRN
  Administered 2022-05-02: 20 mL

## 2022-05-02 MED ORDER — BUPIVACAINE HCL (PF) 0.5 % IJ SOLN
INTRAMUSCULAR | Status: AC
  Filled 2022-05-02: qty 30

## 2022-05-02 MED ORDER — HALOPERIDOL LACTATE 5 MG/ML IJ SOLN
5.0000 mg | INTRAMUSCULAR | Status: AC
  Administered 2022-05-02: 5 mg via INTRAVENOUS

## 2022-05-02 MED ORDER — ONDANSETRON HCL 4 MG/2ML IJ SOLN
INTRAMUSCULAR | Status: DC | PRN
  Administered 2022-05-02: 4 mg via INTRAVENOUS

## 2022-05-02 MED ORDER — HALOPERIDOL LACTATE 5 MG/ML IJ SOLN
INTRAMUSCULAR | Status: AC
  Filled 2022-05-02: qty 1

## 2022-05-02 MED ORDER — LIDOCAINE HCL (CARDIAC) PF 100 MG/5ML IV SOSY
PREFILLED_SYRINGE | INTRAVENOUS | Status: DC | PRN
  Administered 2022-05-02: 100 mg via INTRAVENOUS

## 2022-05-02 MED ORDER — MIDAZOLAM HCL 2 MG/2ML IJ SOLN
1.0000 mg | INTRAMUSCULAR | Status: AC
  Administered 2022-05-02: 1 mg via INTRAVENOUS

## 2022-05-02 MED ORDER — LORAZEPAM 2 MG/ML IJ SOLN
INTRAMUSCULAR | Status: AC
  Filled 2022-05-02: qty 1

## 2022-05-02 MED ORDER — EPHEDRINE SULFATE (PRESSORS) 50 MG/ML IJ SOLN
INTRAMUSCULAR | Status: DC | PRN
  Administered 2022-05-02: 5 mg via INTRAVENOUS
  Administered 2022-05-02: 10 mg via INTRAVENOUS

## 2022-05-02 MED ORDER — SUGAMMADEX SODIUM 200 MG/2ML IV SOLN
INTRAVENOUS | Status: DC | PRN
  Administered 2022-05-02: 200 mg via INTRAVENOUS

## 2022-05-02 MED ORDER — FENTANYL CITRATE (PF) 100 MCG/2ML IJ SOLN
INTRAMUSCULAR | Status: AC
  Administered 2022-05-02: 50 ug via INTRAVENOUS
  Filled 2022-05-02: qty 2

## 2022-05-02 MED ORDER — OXYCODONE HCL 5 MG PO TABS
ORAL_TABLET | ORAL | Status: AC
  Filled 2022-05-02: qty 1

## 2022-05-02 MED ORDER — PROPOFOL 10 MG/ML IV BOLUS
INTRAVENOUS | Status: AC
  Filled 2022-05-02: qty 20

## 2022-05-02 MED ORDER — KETOROLAC TROMETHAMINE 30 MG/ML IJ SOLN
INTRAMUSCULAR | Status: AC
  Filled 2022-05-02: qty 1

## 2022-05-02 MED ORDER — SODIUM CHLORIDE FLUSH 0.9 % IV SOLN
INTRAVENOUS | Status: AC
  Filled 2022-05-02: qty 10

## 2022-05-02 MED ORDER — ACETAMINOPHEN 10 MG/ML IV SOLN
INTRAVENOUS | Status: DC | PRN
  Administered 2022-05-02: 1000 mg via INTRAVENOUS

## 2022-05-02 MED ORDER — ACETAMINOPHEN 325 MG PO TABS: 650.0000 mg | ORAL_TABLET | Freq: Three times a day (TID) | ORAL | 0 refills | Status: DC | PRN

## 2022-05-02 MED ORDER — METHOCARBAMOL 1000 MG/10ML IJ SOLN
500.0000 mg | INTRAVENOUS | Status: AC
  Administered 2022-05-02: 500 mg via INTRAVENOUS
  Filled 2022-05-02: qty 500

## 2022-05-02 MED ORDER — INDOCYANINE GREEN 25 MG IV SOLR
1.2500 mg | Freq: Once | INTRAVENOUS | Status: AC
  Administered 2022-05-02: 1.25 mg via INTRAVENOUS
  Filled 2022-05-02: qty 0.5

## 2022-05-02 MED ORDER — OXYCODONE HCL 5 MG PO TABS
5.0000 mg | ORAL_TABLET | Freq: Once | ORAL | Status: AC | PRN
  Administered 2022-05-02: 5 mg via ORAL

## 2022-05-02 MED ORDER — FENTANYL CITRATE (PF) 100 MCG/2ML IJ SOLN
INTRAMUSCULAR | Status: AC
  Filled 2022-05-02: qty 2

## 2022-05-02 MED ORDER — GABAPENTIN 400 MG PO CAPS: 1200.0000 mg | ORAL_CAPSULE | Freq: Once | ORAL | Status: AC

## 2022-05-02 MED ORDER — FENTANYL CITRATE (PF) 100 MCG/2ML IJ SOLN
INTRAMUSCULAR | Status: DC | PRN
  Administered 2022-05-02 (×2): 50 ug via INTRAVENOUS

## 2022-05-02 MED ORDER — LIDOCAINE-EPINEPHRINE 1 %-1:200000 IJ SOLN
INTRAMUSCULAR | Status: AC
Start: 2022-05-02 — End: ?
  Filled 2022-05-02: qty 30

## 2022-05-02 MED ORDER — CEFAZOLIN SODIUM-DEXTROSE 2-4 GM/100ML-% IV SOLN
2.0000 g | INTRAVENOUS | Status: AC
  Administered 2022-05-02: 2 g via INTRAVENOUS

## 2022-05-02 SURGICAL SUPPLY — 54 items
ADH SKN CLS APL DERMABOND .7 (GAUZE/BANDAGES/DRESSINGS) ×2
ANCHOR TIS RET SYS 235ML (MISCELLANEOUS) ×2 IMPLANT
BAG PRESSURE INF REUSE 1000 (BAG) IMPLANT
BAG TISS RTRVL C235 10X14 (MISCELLANEOUS)
BLADE SURG SZ11 CARB STEEL (BLADE) ×3 IMPLANT
CANNULA REDUC XI 12-8 STAPL (CANNULA) ×3
CANNULA REDUCER 12-8 DVNC XI (CANNULA) ×2 IMPLANT
CATH REDDICK CHOLANGI 4FR 50CM (CATHETERS) IMPLANT
CLIP LIGATING HEMO O LOK GREEN (MISCELLANEOUS) ×3 IMPLANT
DERMABOND ADVANCED (GAUZE/BANDAGES/DRESSINGS) ×1
DERMABOND ADVANCED .7 DNX12 (GAUZE/BANDAGES/DRESSINGS) ×2 IMPLANT
DRAPE ARM DVNC X/XI (DISPOSABLE) ×8 IMPLANT
DRAPE C-ARM XRAY 36X54 (DRAPES) IMPLANT
DRAPE COLUMN DVNC XI (DISPOSABLE) ×2 IMPLANT
DRAPE DA VINCI XI ARM (DISPOSABLE) ×12
DRAPE DA VINCI XI COLUMN (DISPOSABLE) ×3
ELECT CAUTERY BLADE 6.4 (BLADE) ×3 IMPLANT
ELECT REM PT RETURN 9FT ADLT (ELECTROSURGICAL) ×3
ELECTRODE REM PT RTRN 9FT ADLT (ELECTROSURGICAL) ×2 IMPLANT
GLOVE BIOGEL PI IND STRL 7.0 (GLOVE) ×4 IMPLANT
GLOVE BIOGEL PI INDICATOR 7.0 (GLOVE)
GLOVE SURG SYN 6.5 ES PF (GLOVE) ×18 IMPLANT
GLOVE SURG SYN 6.5 PF PI (GLOVE) ×4 IMPLANT
GOWN STRL REUS W/ TWL LRG LVL3 (GOWN DISPOSABLE) ×6 IMPLANT
GOWN STRL REUS W/TWL LRG LVL3 (GOWN DISPOSABLE) ×9
GRASPER SUT TROCAR 14GX15 (MISCELLANEOUS) IMPLANT
IRRIGATOR SUCT 8 DISP DVNC XI (IRRIGATION / IRRIGATOR) IMPLANT
IRRIGATOR SUCTION 8MM XI DISP (IRRIGATION / IRRIGATOR)
IV NS 1000ML (IV SOLUTION)
IV NS 1000ML BAXH (IV SOLUTION) IMPLANT
LABEL OR SOLS (LABEL) ×3 IMPLANT
MANIFOLD NEPTUNE II (INSTRUMENTS) ×3 IMPLANT
NDL INSUFFLATION 14GA 120MM (NEEDLE) ×2 IMPLANT
NEEDLE HYPO 22GX1.5 SAFETY (NEEDLE) ×3 IMPLANT
NEEDLE INSUFFLATION 14GA 120MM (NEEDLE) ×3 IMPLANT
NS IRRIG 500ML POUR BTL (IV SOLUTION) ×3 IMPLANT
OBTURATOR OPTICAL STANDARD 8MM (TROCAR) ×3
OBTURATOR OPTICAL STND 8 DVNC (TROCAR) ×2
OBTURATOR OPTICALSTD 8 DVNC (TROCAR) ×2 IMPLANT
PACK LAP CHOLECYSTECTOMY (MISCELLANEOUS) ×3 IMPLANT
SEAL CANN UNIV 5-8 DVNC XI (MISCELLANEOUS) ×6 IMPLANT
SEAL XI 5MM-8MM UNIVERSAL (MISCELLANEOUS) ×9
SET TUBE SMOKE EVAC HIGH FLOW (TUBING) ×3 IMPLANT
SOLUTION ELECTROLUBE (MISCELLANEOUS) ×3 IMPLANT
SPIKE FLUID TRANSFER (MISCELLANEOUS) ×6 IMPLANT
STAPLER CANNULA SEAL DVNC XI (STAPLE) ×2 IMPLANT
STAPLER CANNULA SEAL XI (STAPLE) ×3
SUT MNCRL 4-0 (SUTURE) ×6
SUT MNCRL 4-0 27XMFL (SUTURE) ×4
SUT VICRYL 0 AB UR-6 (SUTURE) ×3 IMPLANT
SUTURE MNCRL 4-0 27XMF (SUTURE) ×4 IMPLANT
SYR 30ML LL (SYRINGE) IMPLANT
SYSTEM WECK SHIELD CLOSURE (TROCAR) IMPLANT
WATER STERILE IRR 500ML POUR (IV SOLUTION) ×2 IMPLANT

## 2022-05-02 NOTE — OR Nursing (Signed)
I interviewed Alicia Moyer while Meghan the SDS nurse was getting her IV. I asked the patient what procedure that she was having done and she told me a robotic gallbladder removal. She never verbalized that she did not want to have the surgery.

## 2022-05-02 NOTE — Discharge Instructions (Addendum)
Laparoscopic Cholecystectomy, Care After This sheet gives you information about how to care for yourself after your procedure. Your doctor may also give you more specific instructions. If you have problems or questions, contact your doctor. Follow these instructions at home: Care for cuts from surgery (incisions)  Follow instructions from your doctor about how to take care of your cuts from surgery. Make sure you: Wash your hands with soap and water before you change your bandage (dressing). If you cannot use soap and water, use hand sanitizer. Change your bandage as told by your doctor. Leave stitches (sutures), skin glue, or skin tape (adhesive) strips in place. They may need to stay in place for 2 weeks or longer. If tape strips get loose and curl up, you may trim the loose edges. Do not remove tape strips completely unless your doctor says it is okay. Do not take baths, swim, or use a hot tub until your doctor says it is okay. OK TO SHOWER 24HRS AFTER YOUR SURGERY.  Check your surgical cut area every day for signs of infection. Check for: More redness, swelling, or pain. More fluid or blood. Warmth. Pus or a bad smell. Activity Do not drive or use heavy machinery while taking prescription pain medicine. Do not play contact sports until your doctor says it is okay. Do not drive for 24 hours if you were given a medicine to help you relax (sedative). Rest as needed. Do not return to work or school until your doctor says it is okay. General instructions  tylenol and advil as needed for discomfort.  Please alternate between the two every four hours as needed for pain.    Use narcotics, if prescribed, only when tylenol and motrin is not enough to control pain.  325-650mg every 8hrs to max of 3000mg/24hrs (including the 325mg in every norco dose) for the tylenol.    Advil up to 800mg per dose every 8hrs as needed for pain.   To prevent or treat constipation while you are taking prescription  pain medicine, your doctor may recommend that you: Drink enough fluid to keep your pee (urine) clear or pale yellow. Take over-the-counter or prescription medicines. Eat foods that are high in fiber, such as fresh fruits and vegetables, whole grains, and beans. Limit foods that are high in fat and processed sugars, such as fried and sweet foods. Contact a doctor if: You develop a rash. You have more redness, swelling, or pain around your surgical cuts. You have more fluid or blood coming from your surgical cuts. Your surgical cuts feel warm to the touch. You have pus or a bad smell coming from your surgical cuts. You have a fever. One or more of your surgical cuts breaks open. You have trouble breathing. You have chest pain. You have pain that is getting worse in your shoulders. You faint or feel dizzy when you stand. You have very bad pain in your belly (abdomen). You are sick to your stomach (nauseous) for more than one day. You have throwing up (vomiting) that lasts for more than one day. You have leg pain. This information is not intended to replace advice given to you by your health care provider. Make sure you discuss any questions you have with your health care provider. Document Released: 07/01/2008 Document Revised: 04/12/2016 Document Reviewed: 03/10/2016 Elsevier Interactive Patient Education  2019 [employer]     AMBULATORY SURGERY  DISCHARGE INSTRUCTIONS  The drugs that you were given will stay in your system until tomorrow   so for the next 24 hours you should not:  Drive an automobile Make any legal decisions Drink any alcoholic beverage  You may resume regular meals tomorrow.  Today it is better to start with liquids and gradually work up to solid foods.  You may eat anything you prefer, but it is better to start with liquids, then soup and crackers, and gradually work up to solid foods.  Please notify your doctor immediately if you have any unusual bleeding,  trouble breathing, redness and pain at the surgery site, drainage, fever, or pain not relieved by medication.  Additional Instructions:  Please contact your physician with any problems or Same Day Surgery at [phone], Monday through Friday 6 am to 4 pm, or [facility] number at [phone]. 

## 2022-05-02 NOTE — Anesthesia Preprocedure Evaluation (Signed)
Anesthesia Evaluation  Patient identified by MRN, date of birth, ID band Patient awake    Reviewed: Allergy & Precautions, NPO status , Patient's Chart, lab work & pertinent test results  Airway Mallampati: II  TM Distance: >3 FB Neck ROM: full    Dental  (+) Upper Dentures, Lower Dentures, Missing   Pulmonary neg pulmonary ROS, neg shortness of breath, neg sleep apnea, neg recent URI,    Pulmonary exam normal        Cardiovascular hypertension, +CHF  (-) CAD, (-) Past MI and (-) CABG Normal cardiovascular exam     Neuro/Psych negative neurological ROS  negative psych ROS   GI/Hepatic Neg liver ROS, GERD  ,  Endo/Other  negative endocrine ROS  Renal/GU      Musculoskeletal   Abdominal   Peds  Hematology negative hematology ROS (+)   Anesthesia Other Findings Past Medical History: No date: Arthritis No date: CHF (congestive heart failure) (HCC) No date: Chronic pain No date: Hypertension No date: Scoliosis  Past Surgical History: 2000: ABDOMINAL HYSTERECTOMY No date: DILATION AND CURETTAGE OF UTERUS No date: Hip replacement right     Comment:  x6     Reproductive/Obstetrics negative OB ROS                            Anesthesia Physical Anesthesia Plan  ASA: 2  Anesthesia Plan: General/Spinal   Post-op Pain Management:    Induction: Intravenous  PONV Risk Score and Plan: 4 or greater and Ondansetron, Dexamethasone, Midazolam and Treatment may vary due to age or medical condition  Airway Management Planned: Oral ETT  Additional Equipment:   Intra-op Plan:   Post-operative Plan: Extubation in OR  Informed Consent: I have reviewed the patients History and Physical, chart, labs and discussed the procedure including the risks, benefits and alternatives for the proposed anesthesia with the patient or authorized representative who has indicated his/her understanding and  acceptance.     Dental Advisory Given  Plan Discussed with: Anesthesiologist, CRNA and Surgeon  Anesthesia Plan Comments: (Patient consented for risks of anesthesia including but not limited to:  - adverse reactions to medications - damage to eyes, teeth, lips or other oral mucosa - nerve damage due to positioning  - sore throat or hoarseness - Damage to heart, brain, nerves, lungs, other parts of body or loss of life  Patient voiced understanding.)        Anesthesia Quick Evaluation

## 2022-05-02 NOTE — Consult Note (Signed)
[facility] Face-to-Face Psychiatry Consult   Reason for Consult:  negative comments from pain distress Referring Physician:  PACU Patient Identification: Alicia Moyer MRN:  [id number] Principal Diagnosis: Surgery Diagnosis:  Active Problems:   Stress reaction causing mixed disturbance of emotion and conduct   Total Time spent with patient: 45 minutes  Subjective:   Alicia Moyer is a 61 y.o. female patient admitted for cholecystectomy .  HPI:  61 yo female in PACU from surgery in pain.  During post-op while in pain, she stated she wanted to hurt her husband for making her have the surgery and commented on overdosing.  On assessment, she is grimacing and guarding her abdomen.  She denies suicidal/homicidal ideations on assessment along with depression.  No psychosis or substance abuse.  She denies needing any mental health resources or needs at this time, psychiatrically stable for discharge.  Past Psychiatric History: none  Risk to Self:  none Risk to Others:  none Prior Inpatient Therapy:  none Prior Outpatient Therapy:  none  Past Medical History:  Past Medical History:  Diagnosis Date   Arthritis    CHF (congestive heart failure) (HCC)    Chronic pain    Hypertension    Scoliosis     Past Surgical History:  Procedure Laterality Date   ABDOMINAL HYSTERECTOMY  2000   DILATION AND CURETTAGE OF UTERUS     Hip replacement right     x6   Family History:  Family History  Problem Relation Age of Onset   Aneurysm Other         grandmother   Family Psychiatric  History: none Social History:  Social History   Substance and Sexual Activity  Alcohol Use Never     Social History   Substance and Sexual Activity  Drug Use Never    Social History   Socioeconomic History   Marital status: Married    Spouse name: Not on file   Number of children: Not on file   Years of education: Not on file   Highest education level: Not on file  Occupational History   Not on file   Tobacco Use   Smoking status: Never   Smokeless tobacco: Not on file  Vaping Use   Vaping Use: Never used  Substance and Sexual Activity   Alcohol use: Never   Drug use: Never   Sexual activity: Not on file  Other Topics Concern   Not on file  Social History Narrative   Not on file   Social Determinants of Health   Financial Resource Strain: Not on file  Food Insecurity: Not on file  Transportation Needs: Not on file  Physical Activity: Not on file  Stress: Not on file  Social Connections: Not on file   Additional Social History:    Allergies:   Allergies  Allergen Reactions   Clindamycin/Lincomycin Anaphylaxis    Rash,swelling of face and neck    Labs: No results found for this or any previous visit (from the past 48 hour(s)).  Current Facility-Administered Medications  Medication Dose Route Frequency Provider Last Rate Last Admin   chlorhexidine (PERIDEX) 0.12 % solution            fentaNYL (SUBLIMAZE) injection 25-50 mcg  25-50 mcg Intravenous Q5 min PRN Stephanie Coup, MD   50 mcg at 05/02/22 1402   fentaNYL (SUBLIMAZE) injection 25-50 mcg  25-50 mcg Intravenous Q5 min PRN Stephanie Coup, MD       haloperidol lactate (HALDOL) 5 MG/ML  injection            lactated ringers infusion   Intravenous Continuous Corinda Gubler, MD 10 mL/hr at 05/02/22 1208 Continued from Pre-op at 05/02/22 1208   LORazepam (ATIVAN) 2 MG/ML injection            midazolam (VERSED) 2 MG/2ML injection            oxyCODONE (Oxy IR/ROXICODONE) 5 MG immediate release tablet            oxyCODONE (Oxy IR/ROXICODONE) immediate release tablet 5 mg  5 mg Oral Once PRN Stephanie Coup, MD       Or   oxyCODONE (ROXICODONE) 5 MG/5ML solution 5 mg  5 mg Oral Once PRN Stephanie Coup, MD       sodium chloride flush 0.9 % injection             Musculoskeletal: Strength & Muscle Tone: decreased Gait & Station:  did not witness Patient leans: N/A  Psychiatric Specialty Exam: Physical Exam Vitals  and nursing note reviewed.  Constitutional:      Appearance: Normal appearance.  HENT:     Head: Normocephalic.     Nose: Nose normal.  Pulmonary:     Effort: Pulmonary effort is normal.  Musculoskeletal:        General: Normal range of motion.     Cervical back: Normal range of motion.  Neurological:     General: No focal deficit present.     Mental Status: She is alert and oriented to person, place, and time.  Psychiatric:        Attention and Perception: Attention and perception normal.        Mood and Affect: Mood is anxious.        Speech: Speech normal.        Behavior: Behavior normal. Behavior is cooperative.        Thought Content: Thought content normal.        Cognition and Memory: Cognition and memory normal.        Judgment: Judgment normal.     Review of Systems  Gastrointestinal:  Positive for abdominal pain.  Psychiatric/Behavioral:  The patient is nervous/anxious.   All other systems reviewed and are negative.   Blood pressure 124/72, pulse 84, temperature 98.6 F (37 C), resp. rate (!) 8, height 5\' 4"  (1.626 m), weight 74 kg, SpO2 (!) 77 %.Body mass index is 28 kg/m.  General Appearance: Casual  Eye Contact:  Fair  Speech:  Normal Rate  Volume:  Normal  Mood:  Anxious  Affect:  Congruent  Thought Process:  Coherent  Orientation:  Full (Time, Place, and Person)  Thought Content:  WDL and Logical  Suicidal Thoughts:  No  Homicidal Thoughts:  No  Memory:  Immediate;   Good Recent;   Good Remote;   Good  Judgement:  Good  Insight:  Good  Psychomotor Activity:  Decreased  Concentration:  Concentration: Fair and Attention Span: Fair  Recall:  Good  Fund of Knowledge:  Good  Language:  Good  Akathisia:  No  Handed:  Right  AIMS (if indicated):     Assets:  Housing Leisure Time Resilience Social Support  ADL's:  Intact  Cognition:  WNL  Sleep:        Physical Exam: Physical Exam Vitals and nursing note reviewed.  Constitutional:       Appearance: Normal appearance.  HENT:     Head: Normocephalic.     Nose: Nose  normal.  Pulmonary:     Effort: Pulmonary effort is normal.  Musculoskeletal:        General: Normal range of motion.     Cervical back: Normal range of motion.  Neurological:     General: No focal deficit present.     Mental Status: She is alert and oriented to person, place, and time.  Psychiatric:        Attention and Perception: Attention and perception normal.        Mood and Affect: Mood is anxious.        Speech: Speech normal.        Behavior: Behavior normal. Behavior is cooperative.        Thought Content: Thought content normal.        Cognition and Memory: Cognition and memory normal.        Judgment: Judgment normal.    Review of Systems  Gastrointestinal:  Positive for abdominal pain.  Psychiatric/Behavioral:  The patient is nervous/anxious.   All other systems reviewed and are negative.  Blood pressure 124/72, pulse 84, temperature 98.6 F (37 C), resp. rate 16, height 5\' 4"  (1.626 m), weight 74 kg, SpO2 (!) 77 %. Body mass index is 28 kg/m.  Treatment Plan Summary: Stress reaction with mixed disturbance of emotions and conduct: Pain control measures  Disposition: No evidence of imminent risk to self or others at present.   Patient does not meet criteria for psychiatric inpatient admission.  , NP 05/02/2022 3:26 PM

## 2022-05-02 NOTE — Interval H&P Note (Signed)
History and Physical Interval Note:  05/02/2022 11:27 AM  Alicia Moyer  has presented today for surgery, with the diagnosis of Biliary colic K80.50.  The various methods of treatment have been discussed with the patient and family. After consideration of risks, benefits and other options for treatment, the patient has consented to  Procedure(s): XI ROBOTIC ASSISTED LAPAROSCOPIC CHOLECYSTECTOMY (N/A) INDOCYANINE GREEN FLUORESCENCE IMAGING (ICG) (N/A) as a surgical intervention.  The patient's history has been reviewed, patient examined, no change in status, stable for surgery.  I have reviewed the patient's chart and labs.  Questions were answered to the patient's satisfaction.     Luan Urbani   

## 2022-05-02 NOTE — Progress Notes (Signed)
Post op events:  The patient was given toradol and tylenol intraop. After extubation in the OR, the patient stated that she was in pain and the CRNA gave 1mg  of dilaudid. After the patient reached the PACU, she was given 5mg  of oral oxycodone, and of fentanyl in small doses. The patient complained that she was still having pain. She received 0.5mg  of dilaudid more, 1200mg  of gabapentin, 500 of Robaxin. 5mg  more of Oxycodone and of fentanyl was ordered.   The patient then told the nurse that she wasn't receiving anything for her pain and that she wanted to go home and take all of her pain medication so that she would overdose and kill herself. She also told the nurse that she wanted to kill her husband for talking her into having the procedure done. After the PACU nurse called me with her suicidal thoughts, I immediately went to talk to the patient. She told me the same thing that she told the nurse. She stated "I want to go home to take all of my pain pills at once and overdose." I discussed with the patient that I couldn't let her leave the hospital because she was having suicidal thoughts which she replied "I take it all back".   I discussed the case with Dr. and told him that she would need to be involuntarily admitted due to her suicidal thoughts. Dr. agreed with me and stated he would talk to the patient after he finished the current case. Currently the patient is being held in PACU and awaits a emergent consult with psychiatry.

## 2022-05-02 NOTE — Progress Notes (Signed)
PACU update:  Notified of events in PACU while in another case.  Came by to assess patient as soon as the case was completed.  At the time of my exam, patient is calm, alert and oriented x3, states pain is much better under control.  Patient states the pain started as soon as she woke up postop, describes it as generalized abdominal pain with extreme cramping sensation.  Currently denies any significant pain, nontender to palpation, incisions remain clean dry and intact.  She was able to void, and has been off oxygen for the past 5 minutes with saturation in the mid 90s.  She was able to answer questions in complete sentences and was clinically of sound mind.  I explained to her concerns with the previous statements she has made in the recovery room, especially regarding the suicidal and homicidal ideations.  Patient acknowledged that she did make the statements, but states that she  no longer have these feelings.  I specifically asked her if she will have potential temptations to overuse pain medications available to her at home.  She answered that her husband will be around and that will not be an issue.  She has never had issues with misuse in the past per verbal report.  When I asked her why she made those statements and she remembers those statements, she verbalized acknowledgment and stated that anxiety may have played a role in her actions earlier.  When I initially recommended overnight observation just to make sure that she continues to have adequate pain control, she declined politely saying that she would much rather go home since she will be more comfortable.  Again I asked her if there was any concerns about potential misuse and if she understands that her current state of pain may be temporary due to the medication she was given in PACU.  She verbalized understanding that this may be the case, but she still is requesting to go home.   She also requested that no additional pain medications be  sent home with her since she believes that the current pain regimen she is taking for her chronic pain issues will suffice.  I specifically mentioned how this may not be enough and recommended additional medications to be at least sent to the pharmacy.  She again declined this recommendation and requested that she be discharged home.  With assessment by behavioral health stating she is of sound mind and able to make medical decisions, along with my current exam agreeing with their assessment, we will be discharging her home.  Husband notified of the updates and given strict return precautions as well.  He is comfortable going home with her.    Of note, patient at no point during today's interaction specifically mentioned not wanting to proceed with the surgery.  She did voice some general anxiety, stating she is nervous about the procedure, but she never specifically requested to cancel/postpone the case pre-op.

## 2022-05-02 NOTE — Op Note (Signed)
Preoperative diagnosis:  chronic and cholecystitis  Postoperative diagnosis: same as above  Procedure: Robotic assisted Laparoscopic Cholecystectomy.   Anesthesia: GETA   Surgeon: Sung Amabile  Specimen: Gallbladder  Complications: None  EBL: 14mL  Wound Classification: Clean Contaminated  Indications: see HPI  Findings: Critical view of safety noted Cystic duct and artery identified, ligated and divided, clips remained intact at end of procedure Adequate hemostasis  Description of procedure:  The patient was placed on the operating table in the supine position. SCDs placed, pre-op abx administered.  General anesthesia was induced and OG tube placed by anesthesia. A time-out was completed verifying correct patient, procedure, site, positioning, and implant(s) and/or special equipment prior to beginning this procedure. The abdomen was prepped and draped in the usual sterile fashion.    Veress needle was placed at the Palmer's point and insufflation was started after confirming a positive saline drop test and no immediate increase in abdominal pressure.  After reaching 15 mm, the Veress needle was removed and 50mm port placed through same spot via optiview due to previous abdominal surgery. The abdomen was inspected and no abnormalities or injuries were found.  Under direct vision, ports were placed in the following locations: a 8 mm port was placed under umbilicus measured 30mm from gallbladder.  One 12 mm patient left of the umbilicus, 8cm from the optiviewed port, one 8 mm port placed to the patient right of the umbilical port 8 cm apart.  1 additional 8 mm port placed lateral to the 52mm port.  Once ports were placed, The table was placed in the reverse Trendelenburg position with the right side up. The Xi platform was brought into the operative field and docked to the ports successfully.  An endoscope was placed through the umbilical port, fenestrated grasper through the adjacent patient  right port, prograsp to the far patient left port, and then a hook cautery in the left port.  Fatty falciform dissected off abdominal wall to facilitate instrument access around gallbladder.  The dome of the gallbladder was grasped with prograsp, passed and retracted over the dome of the liver. Adhesions between the gallbladder and omentum, duodenum and transverse colon were lysed via hook cautery. The infundibulum was grasped with the fenestrated grasper and retracted toward the right lower quadrant. This maneuver exposed Calot's triangle. The peritoneum overlying the gallbladder infundibulum was then dissected  and the cystic duct and cystic artery identified.  Critical view of safety with the liver bed clearly visible behind the duct and artery with no additional structures noted.  The cystic duct and cystic artery clipped and divided close to the gallbladder.     The gallbladder was then dissected from its peritoneal and liver bed attachments by electrocautery. Hemostasis was checked prior to removing the hook cautery and the Endo Catch bag was then placed through the 12 mm port and the gallbladder was removed.  The gallbladder was passed off the table as a specimen. There was no evidence of bleeding from the gallbladder fossa or cystic artery or leakage of the bile from the cystic duct stump. The 12 mm port site closed with PMI using 0 vicryl under direct vision.  Abdomen desufflated and secondary trocars were removed under direct vision. No bleeding was noted. All skin incisions then closed with subcuticular sutures of 4-0 monocryl and dressed with topical skin adhesive. The orogastric tube was removed and patient extubated.  The patient tolerated the procedure well and was taken to the postanesthesia care unit in stable  condition.  All sponge and instrument count correct at end of procedure.

## 2022-05-02 NOTE — Progress Notes (Addendum)
1435= pt continues to yell in pain.  Dr Lorette Ang has been to bedside.  This RN called and informed him of pain and comments pt is making.  Pt states "I just want to go home and take a bunch of my medicine and if I overdose and die, I don't care".    1445- pt now stating " maybe I should drug my husband and kill him since he made me have the surgery".    Dr Lorette Ang made aware of all comments patient is making and concern of sending her home since she is a pain management and chronic pain pt with multiple medications at home.    Dr Lorette Ang returned to bedside and new orders placed.    Dr Tonna Boehringer notified of pt and requested he come to bedside and see pt when done with current surgery.  Updated about situation.  Pt wanting to leave but continuously yelling in pacu about wanting to go home and take bunch meds.  "Just let me go home and if I overdose my husband can bring me back to the hospital".  Pt wanting to leave and get dressed.  Dianna RN updated about pt situation; she will discuss with AC.    This RN attempting to calm and keep pt at bedside until surgeon can see.  Emergent psych consult placed.    1520- dr Shaune Pollack with psych at bedside for evaluation.  Pt dr Shaune Pollack ok for pt to be discharged from psych stand point.  1615 Pt calm and reports pain is gone at this time. Sitting in PACU waiting for dr Tonna Boehringer to see her.  Given something to drink, declined food. On [place] at this time with sats in mid 90s.  1645-dr sakai at bedside and ok for pt to be Discharge home. Sats remain WNL on RA, pt alert and pain under control. Pt in agreement with this plan.  Dr Karlton Lemon aware and in agreement.

## 2022-05-02 NOTE — Anesthesia Procedure Notes (Signed)
Procedure Name: Intubation Date/Time: 05/02/2022 12:18 PM  Performed by: Derenda Mis, CRNAPre-anesthesia Checklist: Patient identified, Emergency Drugs available, Suction available and Patient being monitored Patient Re-evaluated:Patient Re-evaluated prior to induction Oxygen Delivery Method: Circle system utilized Preoxygenation: Pre-oxygenation with 100% oxygen Induction Type: IV induction Ventilation: Mask ventilation without difficulty Laryngoscope Size: McGraph and 3 Grade View: Grade I Tube type: Oral Tube size: 6.5 mm Number of attempts: 1 Airway Equipment and Method: Stylet Placement Confirmation: ETT inserted through vocal cords under direct vision, positive ETCO2 and breath sounds checked- equal and bilateral Secured at: 21 cm Tube secured with: Tape Dental Injury: Teeth and Oropharynx as per pre-operative assessment

## 2022-05-02 NOTE — Progress Notes (Signed)
Informed pt's husband pt is still in PACU and we were working on pain control and calming her down.  Informed husband that pt made statements about killing herself and him.  Psych consult has been ordered.  Husband stated this is "typical [place]" and if she needed to be admitted that was fine.  Will continue to keep husband updated.

## 2022-05-02 NOTE — Interval H&P Note (Signed)
History and Physical Interval Note:  05/02/2022 11:27 AM  Alicia Moyer  has presented today for surgery, with the diagnosis of Biliary colic K80.50.  The various methods of treatment have been discussed with the patient and family. After consideration of risks, benefits and other options for treatment, the patient has consented to  Procedure(s): XI ROBOTIC ASSISTED LAPAROSCOPIC CHOLECYSTECTOMY (N/A) INDOCYANINE GREEN FLUORESCENCE IMAGING (ICG) (N/A) as a surgical intervention.  The patient's history has been reviewed, patient examined, no change in status, stable for surgery.  I have reviewed the patient's chart and labs.  Questions were answered to the patient's satisfaction.     Danyon Mcginness Tonna Boehringer

## 2022-05-02 NOTE — Transfer of Care (Signed)
Immediate Anesthesia Transfer of Care Note  Patient: Alicia Moyer  Procedure(s) Performed: XI ROBOTIC ASSISTED LAPAROSCOPIC CHOLECYSTECTOMY (Abdomen) INDOCYANINE GREEN FLUORESCENCE IMAGING (ICG)  Patient Location: PACU  Anesthesia Type:General  Level of Consciousness: awake  Airway & Oxygen Therapy: Patient Spontanous Breathing  Post-op Assessment: Report given to RN  Post vital signs: stable  Last Vitals:  Vitals Value Taken Time  BP 152/104 05/02/22 1347  Temp    Pulse 80 05/02/22 1349  Resp 14 05/02/22 1349  SpO2 100 % 05/02/22 1349  Vitals shown include unvalidated device data.  Last Pain:  Vitals:   05/02/22 1152  TempSrc: Temporal  PainSc: 0-No pain         Complications: No notable events documented.

## 2022-05-05 LAB — SURGICAL PATHOLOGY

## 2022-05-06 ENCOUNTER — Other Ambulatory Visit: Payer: Self-pay | Admitting: Family

## 2022-05-06 NOTE — Anesthesia Postprocedure Evaluation (Signed)
Anesthesia Post Note  Patient: Alicia Moyer  Procedure(s) Performed: XI ROBOTIC ASSISTED LAPAROSCOPIC CHOLECYSTECTOMY (Abdomen) INDOCYANINE GREEN FLUORESCENCE IMAGING (ICG)  Patient location during evaluation: PACU Anesthesia Type: Combined General/Spinal Level of consciousness: awake and alert Pain management: pain level not controlled Vital Signs Assessment: post-procedure vital signs reviewed and stable Respiratory status: spontaneous breathing, nonlabored ventilation, respiratory function stable and patient connected to nasal cannula oxygen Cardiovascular status: blood pressure returned to baseline and stable Postop Assessment: no apparent nausea or vomiting Anesthetic complications: no Comments: Post op events:  The patient was given toradol and tylenol intraop. After extubation in the OR, the patient stated that she was in pain and the CRNA gave 1mg  of dilaudid. After the patient reached the PACU, she was given 5mg  of oral oxycodone, and of fentanyl in small doses. The patient complained that she was still having pain. She received 0.5mg  of dilaudid more, 1200mg  of gabapentin, 500 of Robaxin. 5mg  more of Oxycodone and of fentanyl was ordered.   The patient then told the nurse that she wasn't receiving anything for her pain and that she wanted to go home and take all of her pain medication so that she would overdose and kill herself. She also told the nurse that she wanted to kill her husband for talking her into having the procedure done. After the PACU nurse called me with her suicidal thoughts, I immediately went to talk to the patient. She told me the same thing that she told the nurse. She stated "I want to go home to take all of my pain pills at once and overdose." I discussed with the patient that I couldn't let her leave the hospital because she was having suicidal thoughts which she replied "I take it all back".   I discussed the case with Dr. and told him  that she would need to be involuntarily admitted due to her suicidal thoughts. Dr. agreed with me and stated he would talk to the patient after he finished the current case. Currently the patient is being held in PACU and awaits a emergent consult with psychiatry.   Per psychiatry, patient stated the pain was under control and that she was cleared for discharge.   No notable events documented.   Last Vitals:  Vitals:   05/02/22 1645 05/02/22 1653  BP: (!) 149/97 (!) 155/89  Pulse:  93  Resp:  18  Temp: 36.8 C 36.7 C  SpO2:  97%    Last Pain:  Vitals:   05/02/22 1653  TempSrc: Temporal  PainSc: 0-No pain                 Tonna Boehringer

## 2022-05-14 ENCOUNTER — Other Ambulatory Visit: Payer: Self-pay | Admitting: Surgery

## 2022-05-14 DIAGNOSIS — K811 Chronic cholecystitis: Secondary | ICD-10-CM

## 2022-05-21 ENCOUNTER — Ambulatory Visit
Admission: RE | Admit: 2022-05-21 | Discharge: 2022-05-21 | Disposition: A | Discharge status: Home / Self Care | Payer: Medicare PPO | Source: Ambulatory Visit | Attending: Surgery | Admitting: Surgery

## 2022-05-21 DIAGNOSIS — K811 Chronic cholecystitis: Secondary | ICD-10-CM | POA: Insufficient documentation

## 2022-05-28 NOTE — Progress Notes (Unsigned)
Patient ID: Alicia Moyer, female    DOB: [birth year], 61 y.o.   MRN: [id number]   Alicia Moyer is a 61 year old female with a history of HTN, right hip replacement, chronic heart failure and chronic pain r/t hip replacement.  Echo report from 01/08/22 reviewed and showed an EF of 60-65% along with mild MR/TR. Echo report from 04/16/18 reviewed and showed an EF of >55% along with mild LVH.   She presented to the ED 12/25/21 after noticing her left leg was swollen after a walk. Her BNP was elevated, but she left without being seen and was updated that her BNP was elevated and to follow up with the heart failure clinic.  She presents today for an acute visit with a chief complaint of minimal fatigue with moderate exertion. Describes this as chronic in nature. She has associated shortness of breath, pedal edema (now improving), palpitations, nausea, chronic difficulty sleeping, chronic right hip pain, weight gain and urinary urgency along with this. She denies any dizziness, abdominal distention, chest pain or cough.   She says that 2 nights ago, she gained ~ 5 pounds overnight and had worsening edema. Since then the swelling has improved on its own.   She is seeing PCP later today because she's concerned she may have a urinary tract infection and to discuss recent CBC results that showed an elevated WBC.   Taking furosemide/potassium PRN but hasn't taken it in quite awhile. Home BP running ~ 134/93.   Frustrated that her gallbladder surgery hasn't made her feel any better. All she can say is that she "doesn't feel well". No abdominal pain but does have nausea  Past Medical History:  Diagnosis Date   Arthritis    CHF (congestive heart failure) (HCC)    Chronic pain    Hypertension    Scoliosis    Past Surgical History:  Procedure Laterality Date   ABDOMINAL HYSTERECTOMY  2000   DILATION AND CURETTAGE OF UTERUS     Hip replacement right     x6   Family History  Problem Relation Age of Onset    Aneurysm Other         grandmother   Social History   Tobacco Use   Smoking status: Never   Smokeless tobacco: Not on file  Substance Use Topics   Alcohol use: Never   Allergies  Allergen Reactions   Clindamycin/Lincomycin Anaphylaxis    Rash,swelling of face and neck   Prior to Admission medications   Medication Sig Start Date End Date Taking? Authorizing Provider  gabapentin (NEURONTIN) 600 MG tablet Take 1,200 mg by mouth 2 (two) times daily. 04/23/21  Yes [provider]  losartan (COZAAR) 25 MG tablet Take 1 tablet (25 mg total) by mouth daily. 12/31/21  Yes Alisa Graff, FNP  oxyCODONE ER (XTAMPZA ER) 27 MG C12A Xtampza ER 27 mg capsule sprinkle  1 BID Fill on 05/19/2022   Yes [provider]  tapentadol HCl (NUCYNTA) 75 MG tablet Take 75 mg by mouth 3 (three) times daily as needed (breakthrough).   Yes [provider]  tiZANidine (ZANAFLEX) 4 MG tablet Take 4 mg by mouth 3 (three) times daily. 03/20/22  Yes [provider]  furosemide (LASIX) 20 MG tablet Take 1 tablet (20 mg total) by mouth daily. Patient not taking: Reported on 05/29/2022 12/31/21   Darylene Price A, FNP  potassium chloride (KLOR-CON) 10 MEQ tablet Take 1 tablet (10 mEq total) by mouth daily. Patient not  taking: Reported on 05/29/2022 12/31/21 04/24/22  Delma Freeze, FNP   Review of Systems  Constitutional:  Positive for fatigue. Negative for appetite change and fever.  HENT:  Negative for congestion, postnasal drip and sore throat.   Eyes: Negative.   Respiratory:  Positive for shortness of breath ("little bit"). Negative for cough and chest tightness.   Cardiovascular:  Positive for palpitations and leg swelling ("improving"). Negative for chest pain.  Gastrointestinal:  Positive for nausea. Negative for abdominal distention and abdominal pain.  Endocrine:       "Sweating all the time"  Genitourinary:  Positive for urgency (at times). Negative for dysuria.   Musculoskeletal:  Positive for arthralgias (right hip pain).  Skin: Negative.   Allergic/Immunologic: Negative.   Neurological:  Negative for dizziness and light-headedness.  Hematological:  Negative for adenopathy. Bruises/bleeds easily.  Psychiatric/Behavioral:  Positive for sleep disturbance ("at times"; sleeping on 1 pillow with HOB elevated). Negative for dysphoric mood. The patient is not nervous/anxious.    Vitals:   05/29/22 1209 05/29/22 1212  BP: (!) 167/109 (!) 148/98  Pulse: 94   Resp: 16   SpO2: 100%   Weight: 151 lb (68.5 kg)   Height: 5\' 4"  (1.626 m)    Wt Readings from Last 3 Encounters:  05/29/22 151 lb (68.5 kg)  05/02/22 163 lb 2.3 oz (74 kg)  04/24/22 150 lb (68 kg)   Lab Results  Component Value Date   CREATININE 0.97 01/23/2022   CREATININE 0.71 12/25/2021   CREATININE 0.95 05/05/2021   Physical Exam Vitals and nursing note reviewed.  Constitutional:      Appearance: She is well-developed.  HENT:     Head: Normocephalic and atraumatic.  Cardiovascular:     Rate and Rhythm: Normal rate and regular rhythm.  Pulmonary:     Effort: Pulmonary effort is normal. No respiratory distress.     Breath sounds: No wheezing, rhonchi or rales.  Abdominal:     Palpations: Abdomen is soft.     Tenderness: There is no abdominal tenderness.  Musculoskeletal:     Cervical back: Normal range of motion and neck supple.     Right lower leg: Tenderness (lower leg upon palpation) present. Edema (1+ pitting) present.     Left lower leg: Tenderness (lower leg upon palpation) present. Edema (trace pitting) present.  Neurological:     General: No focal deficit present.     Mental Status: She is alert.  Psychiatric:        Mood and Affect: Mood is not anxious.        Behavior: Behavior normal.    Assessment and Plan:  Chronic Heart failure with preserved EF without structural changes- - NYHA II - euvolemic - weighing daily, discussed continued importance and to  call the HF clinic with a gain of 2 lbs/night or 5 lbs/week - weight up 5 pounds from last visit here 2.5 months ago - saw cardiology (Agbor-Etang) 01/19/22 - adheres to low sodium diet - due to weight gain and increase in edema, advised patient to take 1 dose of her lasix/potassium later today after her PCP appointment - BNP 12/25/21 194  2. HTN- - BP elevated (167/109) & slightly improved upon recheck (148/98) - home BP running ~ 134/93 - saw PCP (Feldpausch) 02/25/22 - BMP reviewed 05/14/22 and showed sodium 138, potassium 3.8, creatinine 1.1 & GFR 51  3. Chronic pain- - managed by pain clinic - taking gabapentin & oxycodone ER  4: Chronic cholecystitis- -  had cholecystectomy 05/02/22 & doesn't feel any better - saw surgeon Tonna Boehringer) 05/14/22 - CBC drawn 05/14/22 and showed WBC 15.3, Hg 17.2 and HCT 51.9 - sees PCP later today   Patient did not bring her medications nor a list. Each medication was verbally reviewed with the patient and she was encouraged to bring the bottles to every visit to confirm accuracy of list.   Patient prefers to call us if she needs [place] for anything instead of making another appointment.

## 2022-05-29 ENCOUNTER — Ambulatory Visit: Payer: Medicare PPO | Attending: Family | Admitting: Family

## 2022-05-29 ENCOUNTER — Encounter: Payer: Self-pay | Admitting: Family

## 2022-05-29 VITALS — BP 148/98 | HR 94 | Resp 16 | Ht 64.0 in | Wt 151.0 lb

## 2022-05-29 DIAGNOSIS — I110 Hypertensive heart disease with heart failure: Secondary | ICD-10-CM | POA: Diagnosis not present

## 2022-05-29 DIAGNOSIS — K811 Chronic cholecystitis: Secondary | ICD-10-CM | POA: Diagnosis not present

## 2022-05-29 DIAGNOSIS — G894 Chronic pain syndrome: Secondary | ICD-10-CM | POA: Diagnosis not present

## 2022-05-29 DIAGNOSIS — I10 Essential (primary) hypertension: Secondary | ICD-10-CM

## 2022-05-29 DIAGNOSIS — G8929 Other chronic pain: Secondary | ICD-10-CM | POA: Diagnosis not present

## 2022-05-29 DIAGNOSIS — Z79891 Long term (current) use of opiate analgesic: Secondary | ICD-10-CM | POA: Insufficient documentation

## 2022-05-29 DIAGNOSIS — Z96641 Presence of right artificial hip joint: Secondary | ICD-10-CM | POA: Diagnosis not present

## 2022-05-29 DIAGNOSIS — R5383 Other fatigue: Secondary | ICD-10-CM | POA: Diagnosis present

## 2022-05-29 DIAGNOSIS — I509 Heart failure, unspecified: Secondary | ICD-10-CM | POA: Insufficient documentation

## 2022-05-29 DIAGNOSIS — I5032 Chronic diastolic (congestive) heart failure: Secondary | ICD-10-CM

## 2022-05-29 NOTE — Patient Instructions (Addendum)
Continue weighing daily and call for an overnight weight gain of 3 pounds or more or a weekly weight gain of more than 5 pounds.   If you have voicemail, please make sure your mailbox is cleaned out so that we may leave a message and please make sure to listen to any voicemails.    Take 1 dose of lasix and potassium later today.    Call us if you need [place] for anything or if swelling doesn't improve

## 2022-05-30 ENCOUNTER — Ambulatory Visit: Payer: Medicare PPO | Admitting: Family

## 2022-06-05 ENCOUNTER — Other Ambulatory Visit: Payer: Self-pay | Admitting: Family Medicine

## 2022-06-05 DIAGNOSIS — R1011 Right upper quadrant pain: Secondary | ICD-10-CM

## 2022-06-10 ENCOUNTER — Ambulatory Visit: Payer: Medicare PPO | Attending: Family Medicine

## 2022-10-02 ENCOUNTER — Telehealth: Payer: Self-pay | Admitting: Family

## 2022-10-02 ENCOUNTER — Encounter: Payer: Medicare PPO | Admitting: Family

## 2022-10-02 NOTE — Telephone Encounter (Signed)
Patient did not show for her Heart Failure Clinic appointment on 10/02/22. Will attempt to reschedule.

## 2022-10-02 NOTE — Progress Notes (Deleted)
Patient ID: Alicia Moyer, female    DOB: Jun 21, 1961, 61 y.o.   MRN: [id number]   Alicia Moyer is a 61 year old female with a history of HTN, right hip replacement, chronic heart failure and chronic pain r/t hip replacement.  Echo report from 01/08/22 reviewed and showed an EF of 60-65% along with mild MR/TR. Echo report from 04/16/18 reviewed and showed an EF of >55% along with mild LVH.   Has not been admitted or been in the ED in the last 6 months.   She presents today for an acute visit with a chief complaint of   Past Medical History:  Diagnosis Date   Arthritis    CHF (congestive heart failure) (HCC)    Chronic pain    Hypertension    Scoliosis    Past Surgical History:  Procedure Laterality Date   ABDOMINAL HYSTERECTOMY  2000   DILATION AND CURETTAGE OF UTERUS     Hip replacement right     x6   Family History  Problem Relation Age of Onset   Aneurysm Other         grandmother   Social History   Tobacco Use   Smoking status: Never   Smokeless tobacco: Not on file  Substance Use Topics   Alcohol use: Never   Allergies  Allergen Reactions   Clindamycin/Lincomycin Anaphylaxis    Rash,swelling of face and neck   Zolpidem Other (See Comments)    Review of Systems  Constitutional:  Positive for fatigue. Negative for appetite change and fever.  HENT:  Negative for congestion, postnasal drip and sore throat.   Eyes: Negative.   Respiratory:  Positive for shortness of breath ("little bit"). Negative for cough and chest tightness.   Cardiovascular:  Positive for palpitations and leg swelling ("improving"). Negative for chest pain.  Gastrointestinal:  Positive for nausea. Negative for abdominal distention and abdominal pain.  Endocrine:       "Sweating all the time"  Genitourinary:  Positive for urgency (at times). Negative for dysuria.  Musculoskeletal:  Positive for arthralgias (right hip pain).  Skin: Negative.   Allergic/Immunologic: Negative.   Neurological:   Negative for dizziness and light-headedness.  Hematological:  Negative for adenopathy. Bruises/bleeds easily.  Psychiatric/Behavioral:  Positive for sleep disturbance ("at times"; sleeping on 1 pillow with HOB elevated). Negative for dysphoric mood. The patient is not nervous/anxious.      Physical Exam Vitals and nursing note reviewed.  Constitutional:      Appearance: She is well-developed.  HENT:     Head: Normocephalic and atraumatic.  Cardiovascular:     Rate and Rhythm: Normal rate and regular rhythm.  Pulmonary:     Effort: Pulmonary effort is normal. No respiratory distress.     Breath sounds: No wheezing, rhonchi or rales.  Abdominal:     Palpations: Abdomen is soft.     Tenderness: There is no abdominal tenderness.  Musculoskeletal:     Cervical back: Normal range of motion and neck supple.     Right lower leg: Tenderness (lower leg upon palpation) present. Edema (1+ pitting) present.     Left lower leg: Tenderness (lower leg upon palpation) present. Edema (trace pitting) present.  Neurological:     General: No focal deficit present.     Mental Status: She is alert.  Psychiatric:        Mood and Affect: Mood is not anxious.        Behavior: Behavior normal.    Assessment  and Plan:  Chronic Heart failure with preserved EF without structural changes- - NYHA II - euvolemic - weighing daily, discussed continued importance and to call the HF clinic with a gain of 2 lbs/night or 5 lbs/week - weight 151 pounds from last visit here 4 months ago - saw cardiology (Agbor-Etang) 01/19/22 - adheres to low sodium diet - BNP 12/25/21 194  2. HTN- - BP  - home BP running  - saw PCP (Feldpausch) 05/29/22 - BMP reviewed 05/29/22 and showed sodium 139, potassium 4.0, creatinine 0.7 & GFR 85  3. Chronic pain- - managed by pain clinic - taking gabapentin & oxycodone ER  4: Chronic cholecystitis- - had cholecystectomy 05/02/22 & doesn't feel any better - saw surgeon Tonna Boehringer)  05/14/22 - CBC drawn 05/29/22 showed WBC 9.3 (down from previous value of 15), Hg 14.6   Patient did not bring her medications nor a list. Each medication was verbally reviewed with the patient and she was encouraged to bring the bottles to every visit to confirm accuracy of list.

## 2022-10-04 ENCOUNTER — Other Ambulatory Visit: Payer: Self-pay | Admitting: Family

## 2022-10-08 ENCOUNTER — Other Ambulatory Visit: Payer: Self-pay | Admitting: Family

## 2022-11-29 LAB — COLOGUARD: COLOGUARD: NEGATIVE

## 2023-01-11 IMAGING — US US EXTREM LOW VENOUS
1 series · 14 of 24 positions shown · non-contrast
Comparison: None.

CLINICAL DATA: Leg swelling

EXAM:
BILATERAL LOWER EXTREMITY VENOUS DOPPLER ULTRASOUND
TECHNIQUE: Gray-scale sonography with compression, as well as color and duplex
ultrasound, were performed to evaluate the deep venous system(s)
from the level of the common femoral vein through the popliteal and
proximal calf veins.

[Series 1: us venous img lower bilat (dvt) · portal-venous · 14 of 57 slices shown]
[im 1/57]
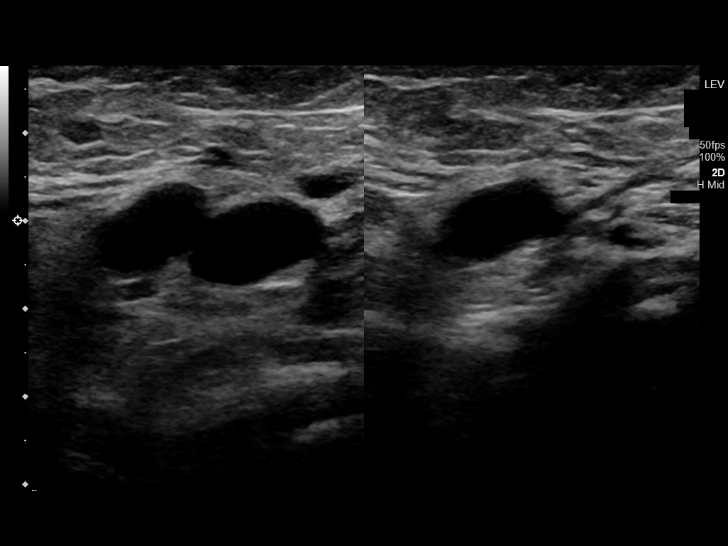
[im 5/57]
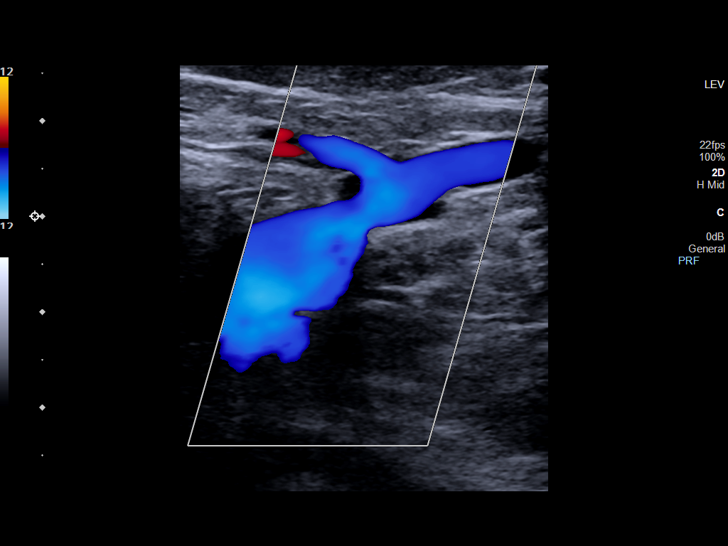
[im 10/57]
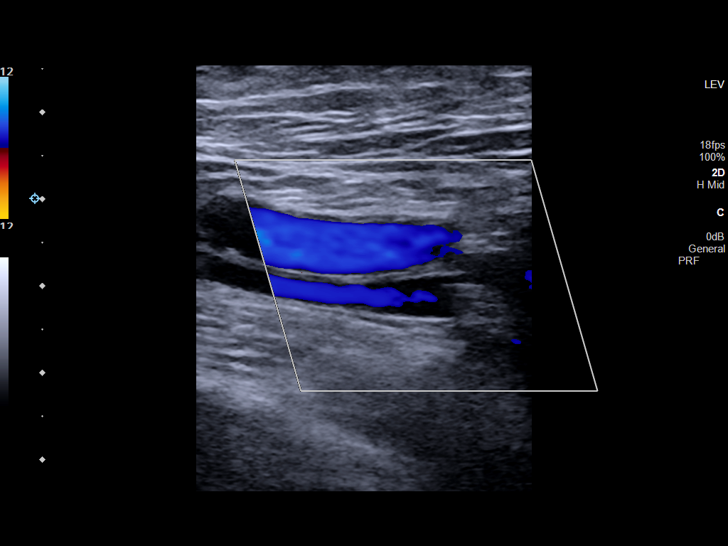
[im 15/57]
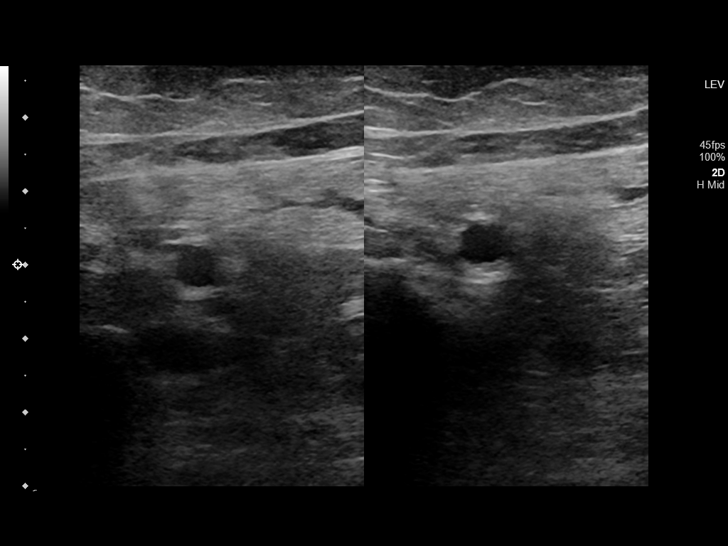
[im 18/57]
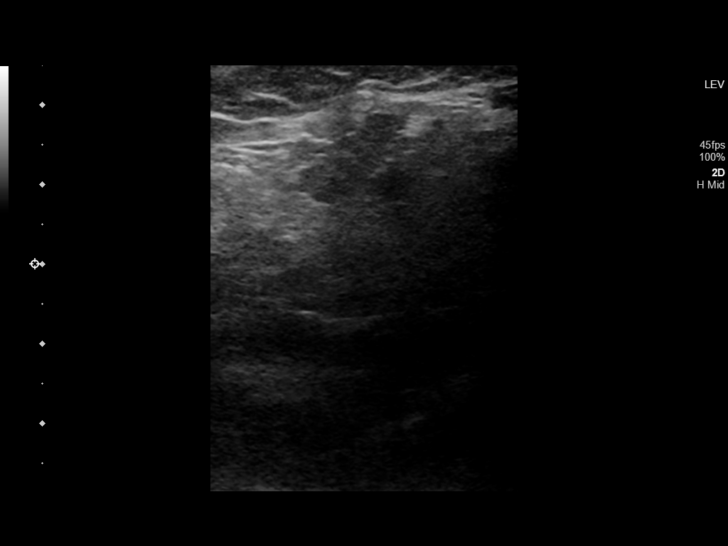
[im 22/57]
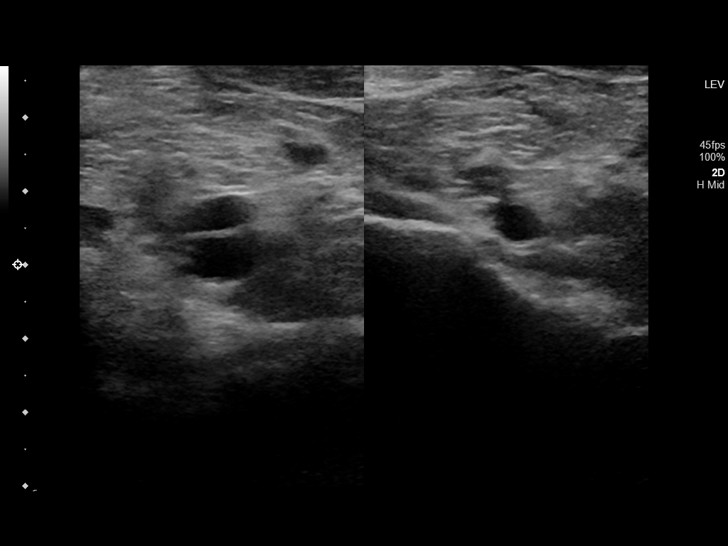
[im 27/57]
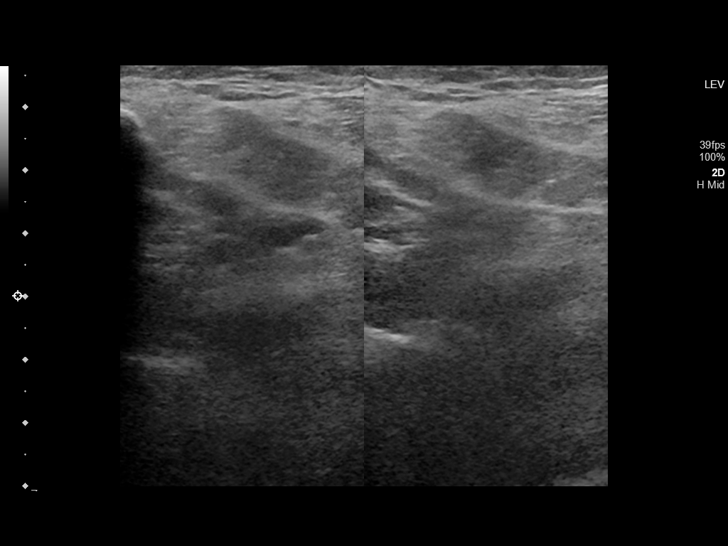
[im 30/57]
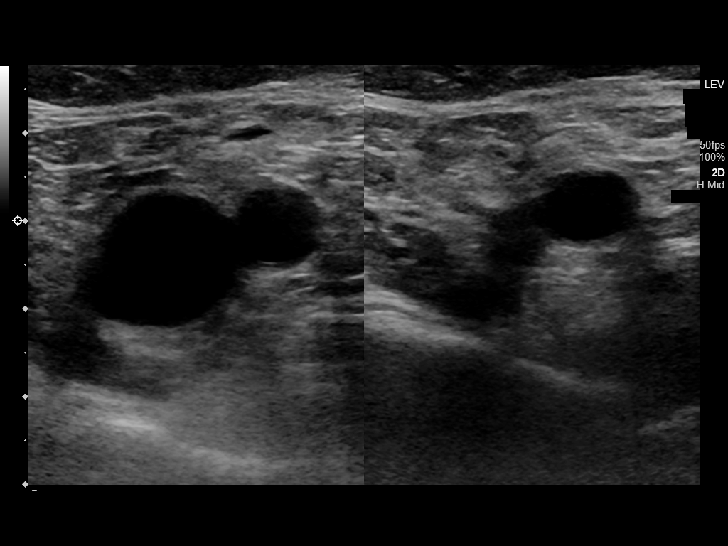
[im 35/57]
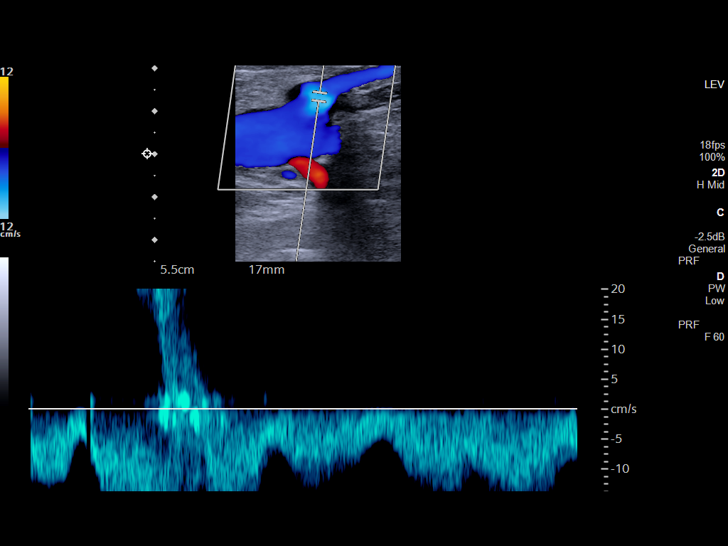
[im 39/57]
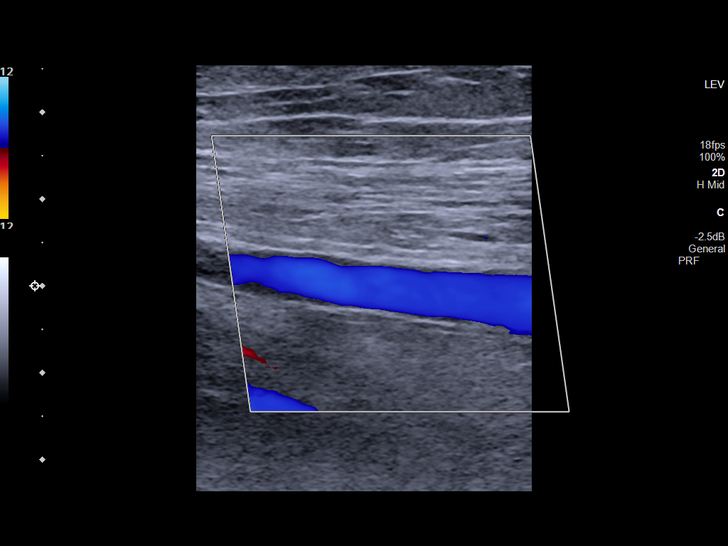
[im 44/57]
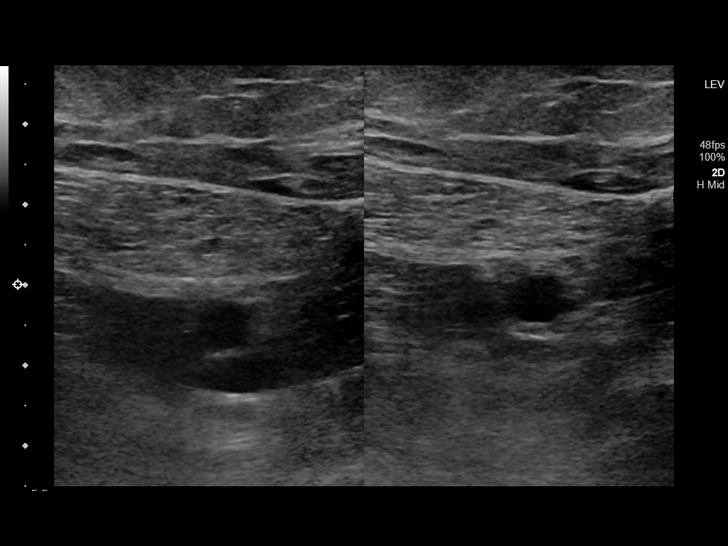
[im 47/57]
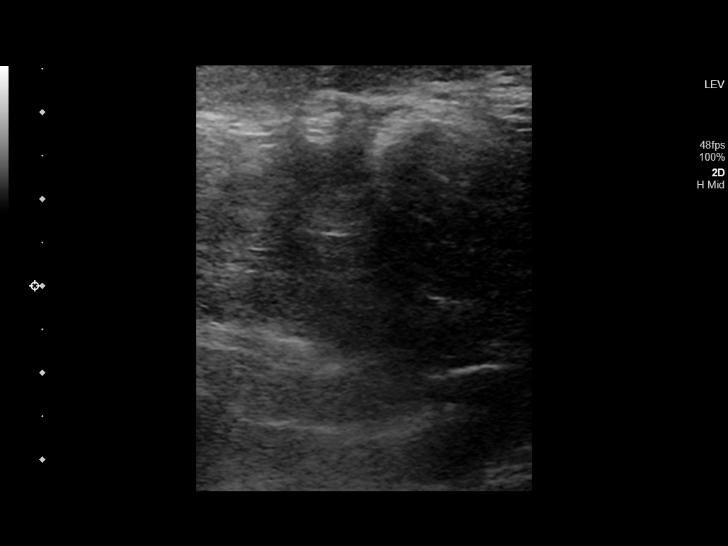
[im 52/57]
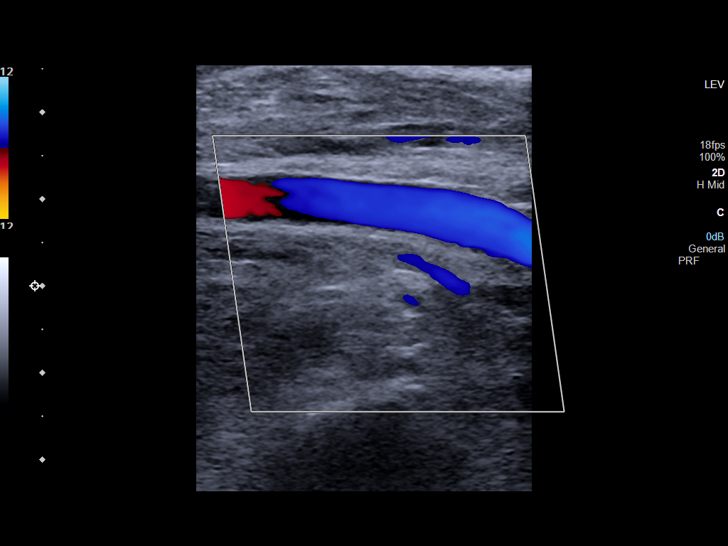
[im 57/57]
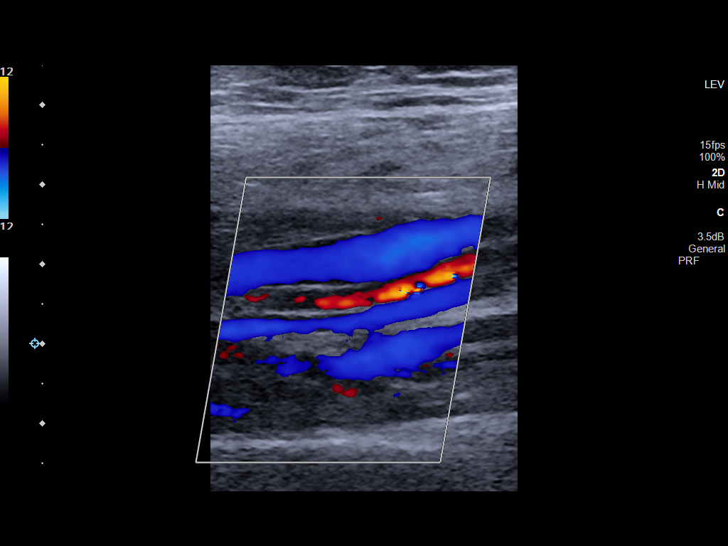

[14 of 24 positions shown; findings below may reference images not displayed]

FINDINGS: VENOUS

Normal compressibility of the common femoral, superficial femoral,
and popliteal veins, as well as the visualized calf veins.
Visualized portions of profunda femoral vein and great saphenous
vein unremarkable. No filling defects to suggest DVT on grayscale or
color Doppler imaging. Doppler waveforms show normal direction of
venous flow, normal respiratory plasticity and response to
augmentation.

OTHER

None.

Limitations: none
IMPRESSION: Negative.

## 2023-01-11 IMAGING — CT CT HEAD W/O CM
4 series · 16 of 47 positions shown, 18 images · non-contrast
Comparison: None.

CLINICAL DATA: Neck trauma, impaired ROM (Age 16-64y); Dizziness,
persistent/recurrent, cardiac or vascular cause suspected



[Series 2: head bone · axial · 0.46mm/px · z∈[-140,-104]mm · 3 of 92 slices shown]
[im 10/92  bone]
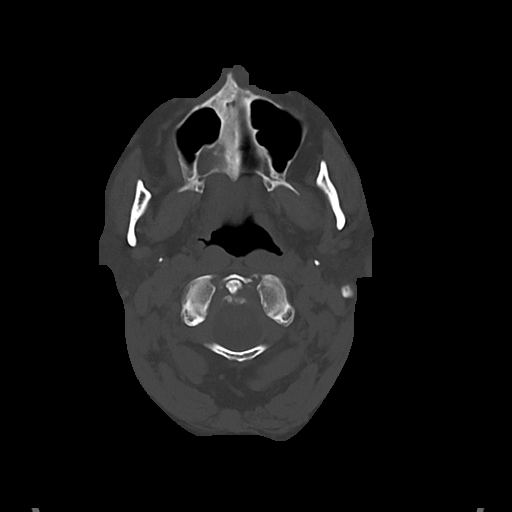
[im 19/92  bone]
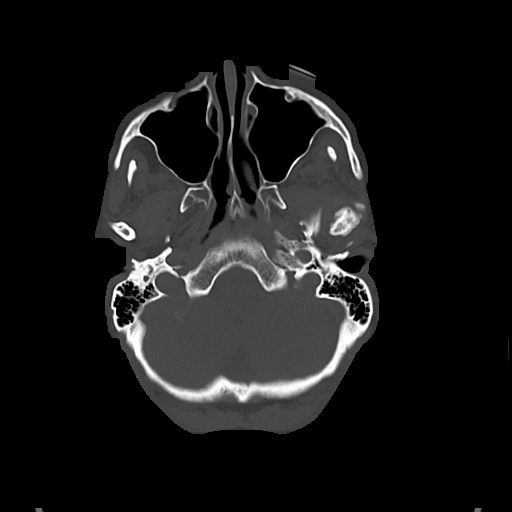
[im 28/92  bone]
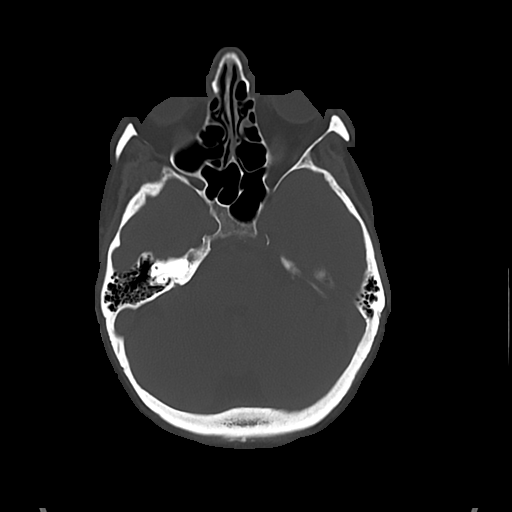

[Series 3: head wo · axial · 0.46mm/px · z∈[-138,-3]mm · 7 of 37 slices shown, 9 images]
[im 5/37  brain]
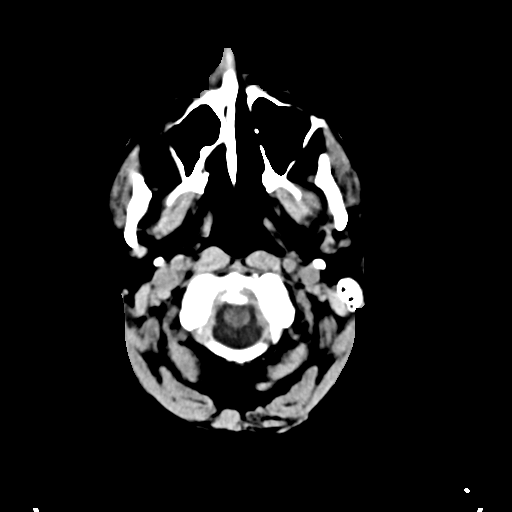
[im 5/37  bone]
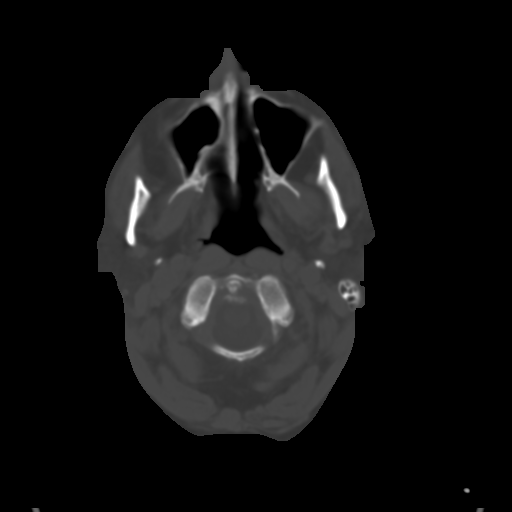
[im 10/37  brain]
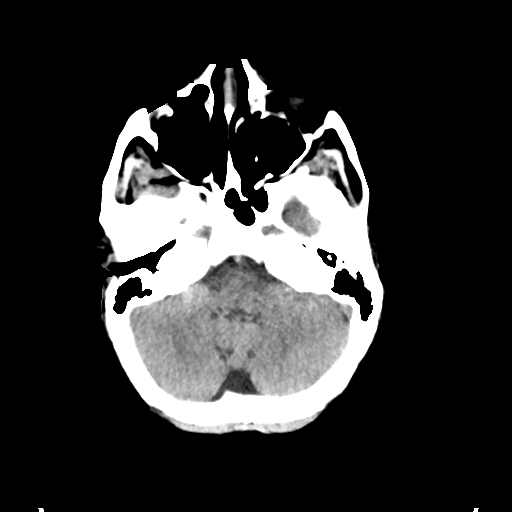
[im 14/37  brain]
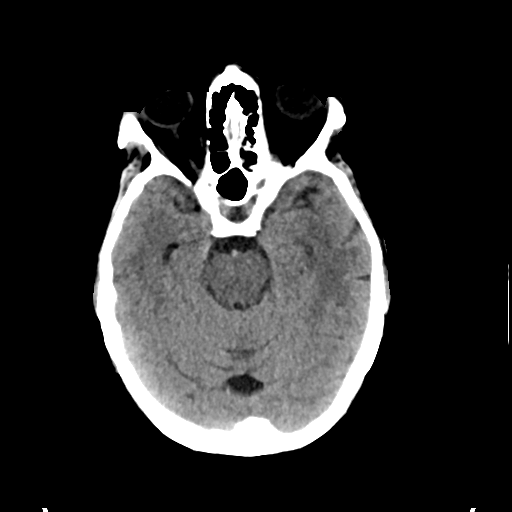
[im 19/37  brain]
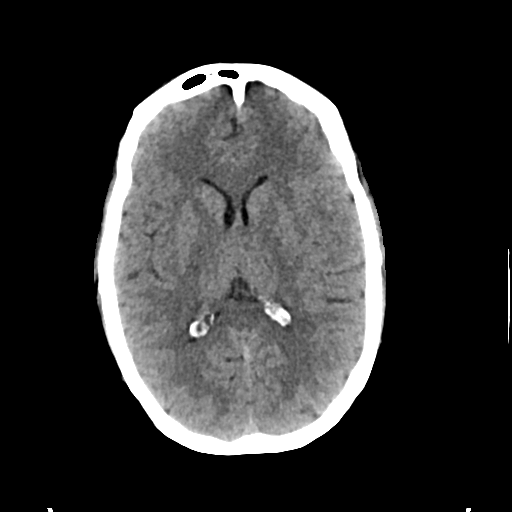
[im 23/37  brain]
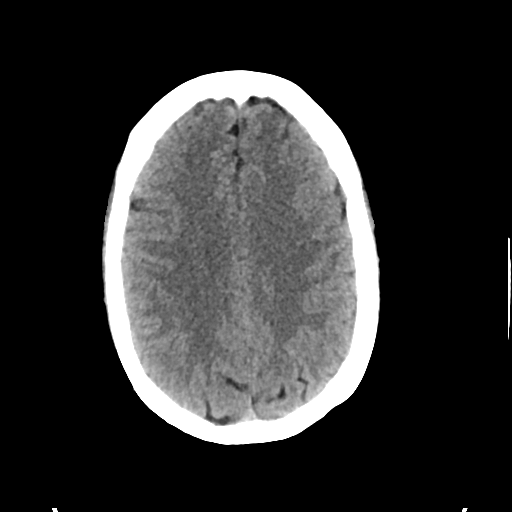
[im 23/37  bone]
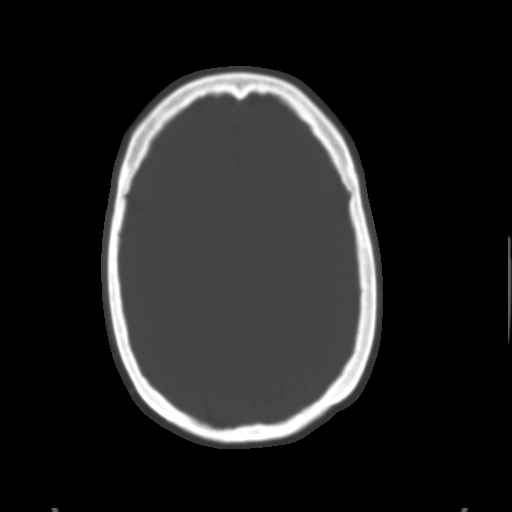
[im 28/37  brain]
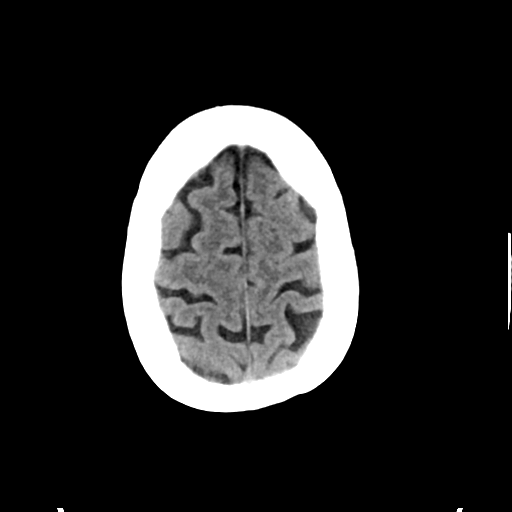
[im 32/37  brain]
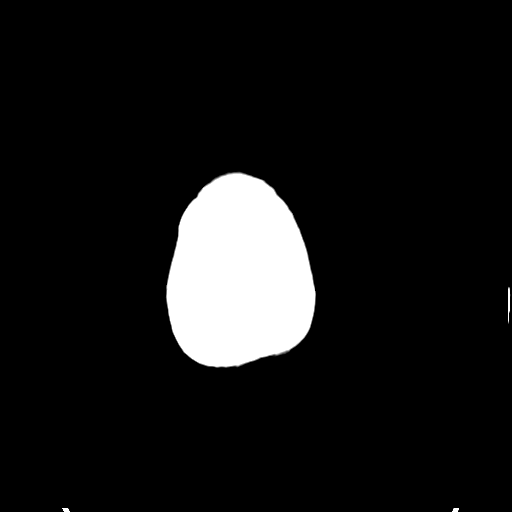

[Series 4: cor soft · coronal · 0.29mm/px · 3 of 71 slices shown]
[im 24/71  brain]
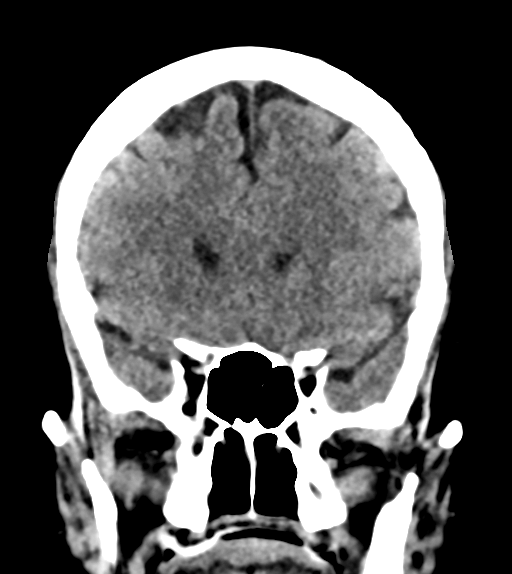
[im 32/71  brain]
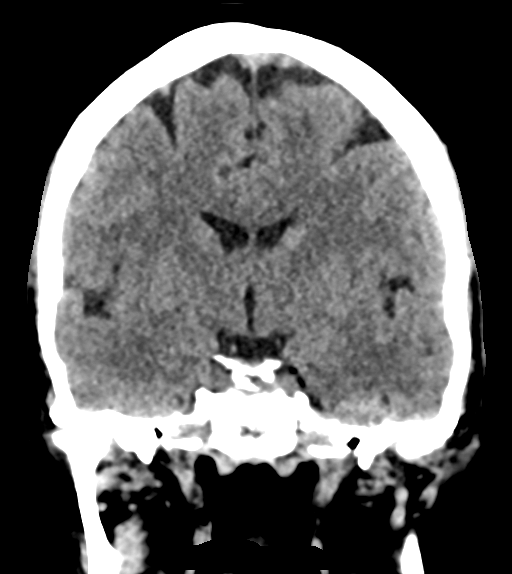
[im 39/71  brain]
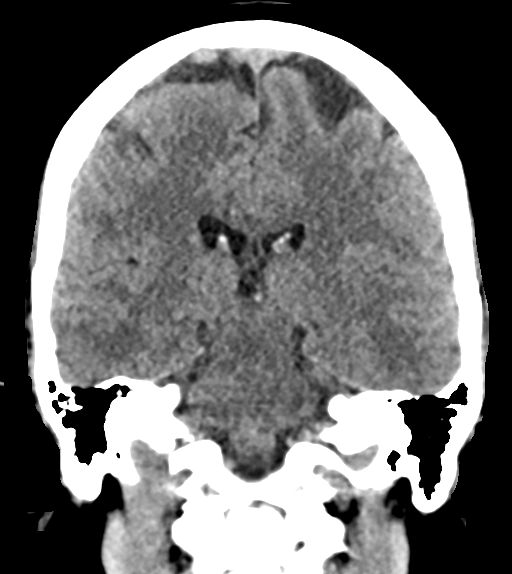

[Series 5: sag soft · sagittal · 0.32mm/px · 3 of 50 slices shown]
[im 17/50  brain]
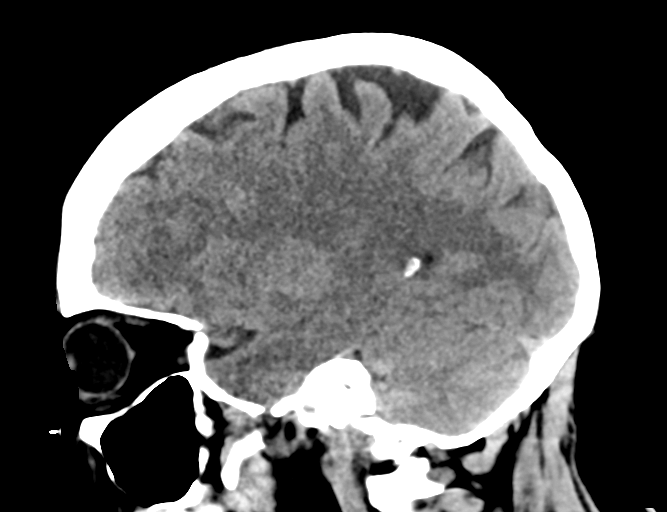
[im 25/50  brain]
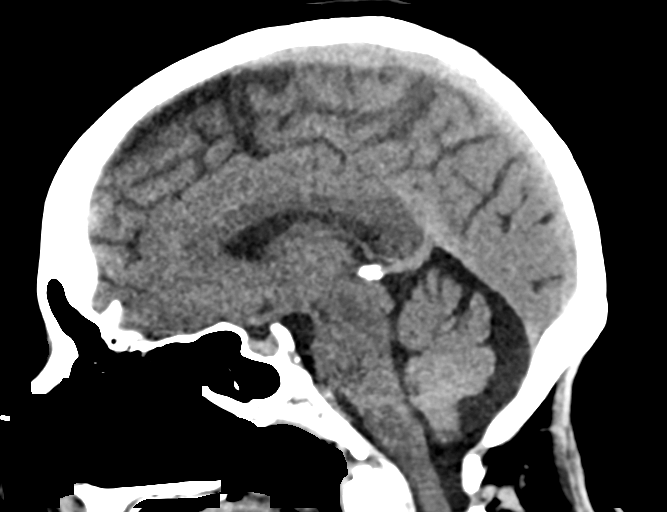
[im 33/50  brain]
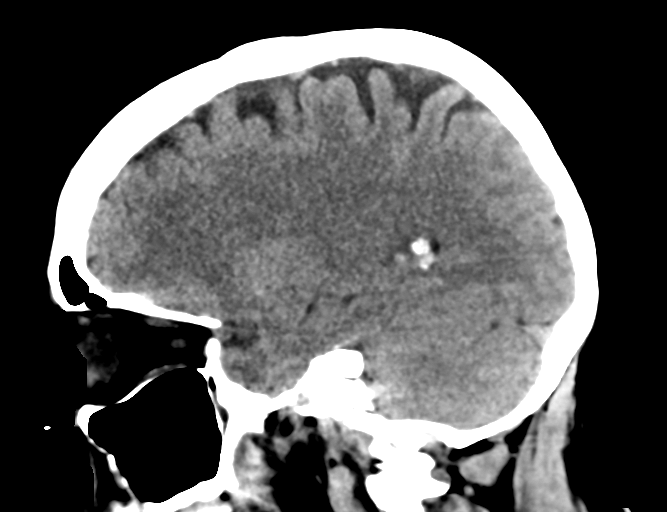

[16 of 47 positions shown; findings below may reference images not displayed]

FINDINGS: CT HEAD FINDINGS

Brain:

No evidence of large-territorial acute infarction. No parenchymal
hemorrhage. No mass lesion. No extra-axial collection.

No mass effect or midline shift. No hydrocephalus. Basilar cisterns
are patent.

Vascular: No hyperdense vessel.

Skull: No acute fracture or focal lesion.

Sinuses/Orbits: Paranasal sinuses and mastoid air cells are clear.
The orbits are unremarkable.

Other: None.

CT CERVICAL SPINE FINDINGS

Alignment: Grade 1 anterolisthesis of C4 on C5. Mild retrolisthesis
of C5 on C6. Grade 1 anterolisthesis of C7 on T1.

Skull base and vertebrae: Multilevel degenerative change of the
spine most prominent at the C5 through C7 levels. No associated
severe osseous neural foraminal central canal stenosis. No acute
fracture. No aggressive appearing focal osseous lesion or focal
pathologic process.

Soft tissues and spinal canal: No prevertebral fluid or swelling. No
visible canal hematoma.

Upper chest: Unremarkable.

Other: None.
IMPRESSION: 1. No acute intracranial abnormality.
2. No acute displaced fracture or traumatic listhesis of the
cervical spine.

## 2023-04-11 IMAGING — US US ABDOMEN LIMITED
1 series · 14 of 25 positions shown · non-contrast
Comparison: None Available.

CLINICAL DATA: Right upper quadrant pain.

EXAM:
ULTRASOUND ABDOMEN LIMITED RIGHT UPPER QUADRANT

[Series 1: us abdomen limited · 0.17mm/px · 14 of 56 slices shown]
[im 1/56]
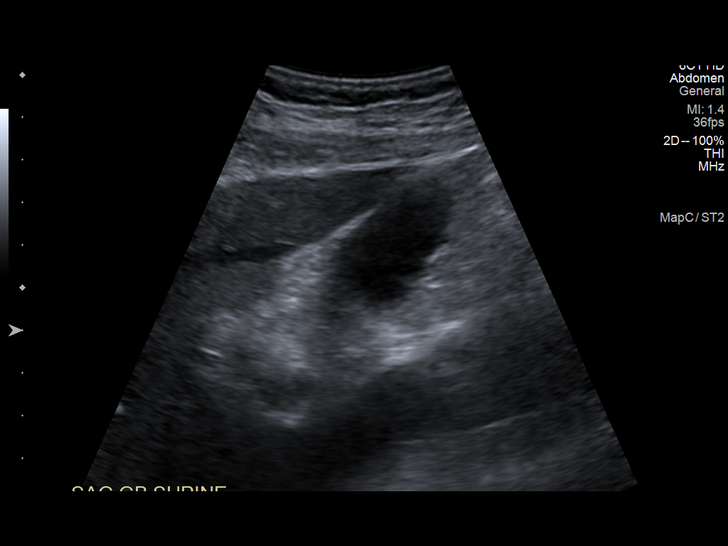
[im 5/56]
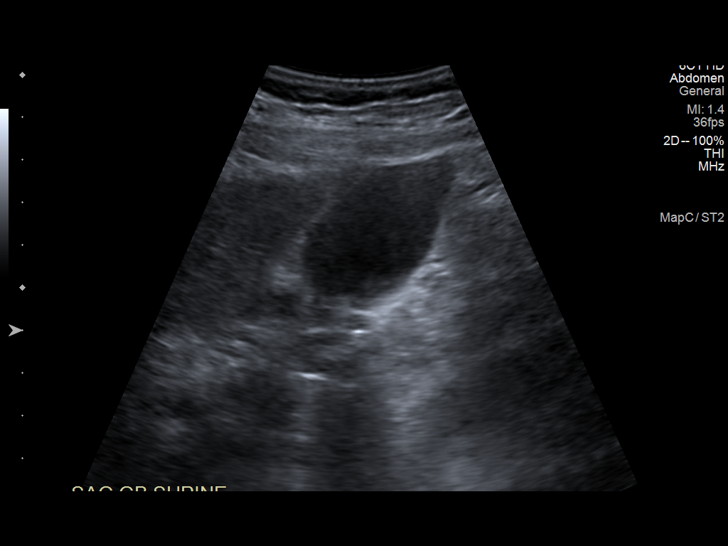
[im 10/56]
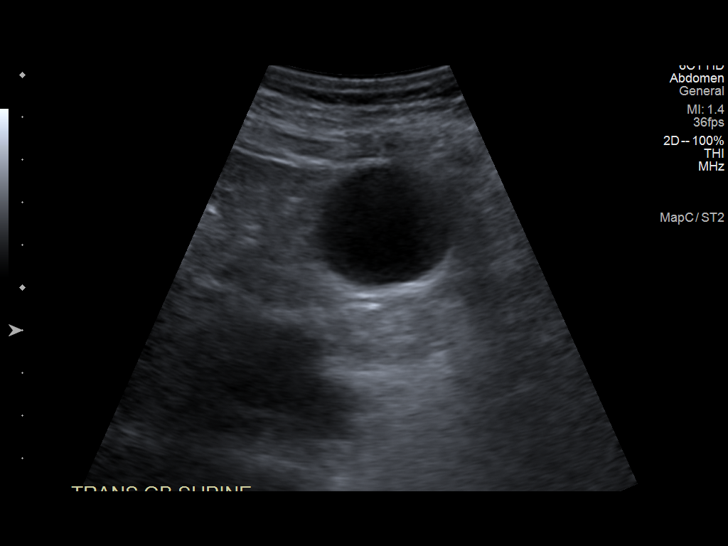
[im 14/56]
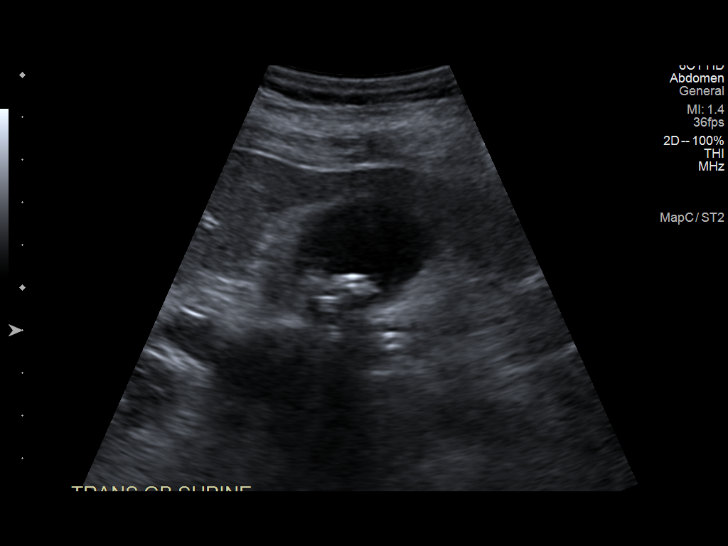
[im 19/56]
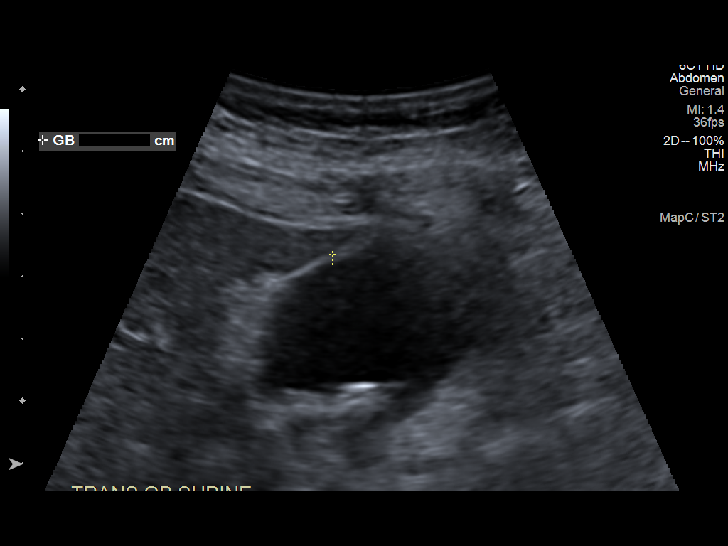
[im 21/56]
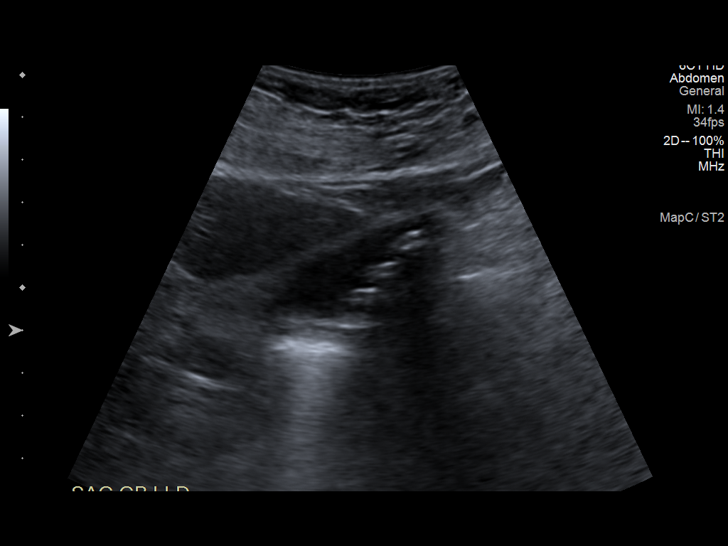
[im 26/56]
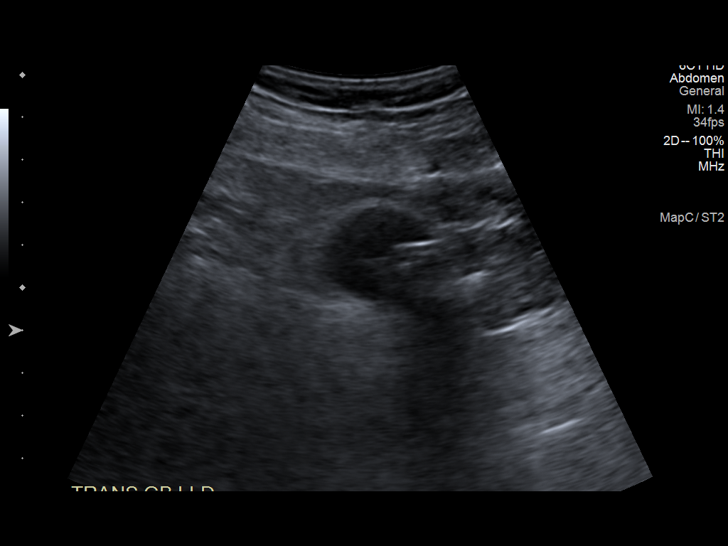
[im 30/56]
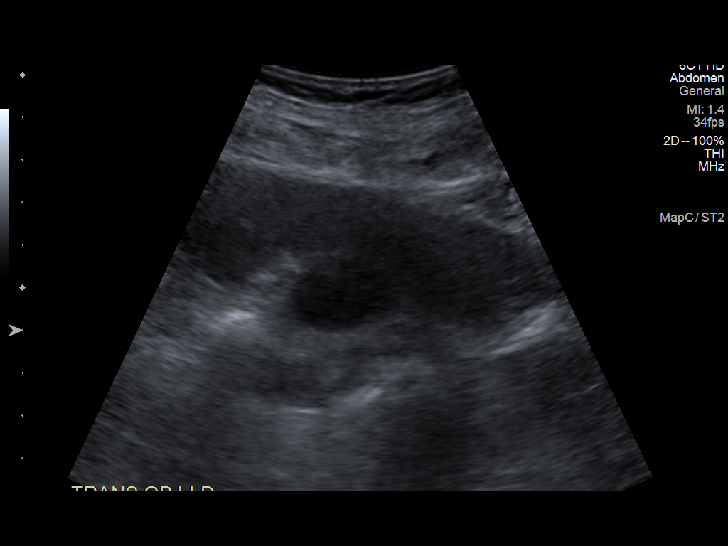
[im 35/56]
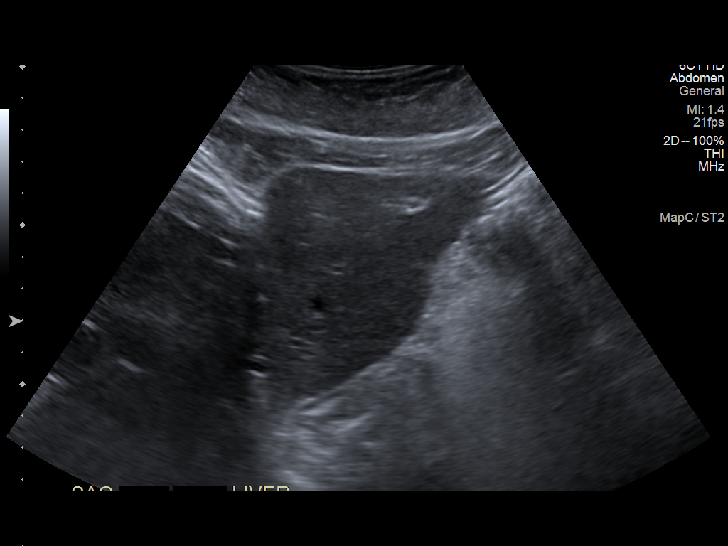
[im 37/56]
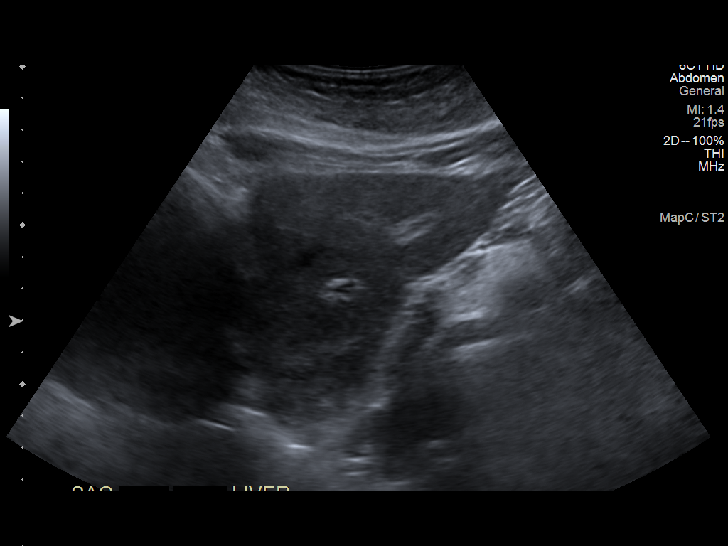
[im 42/56]
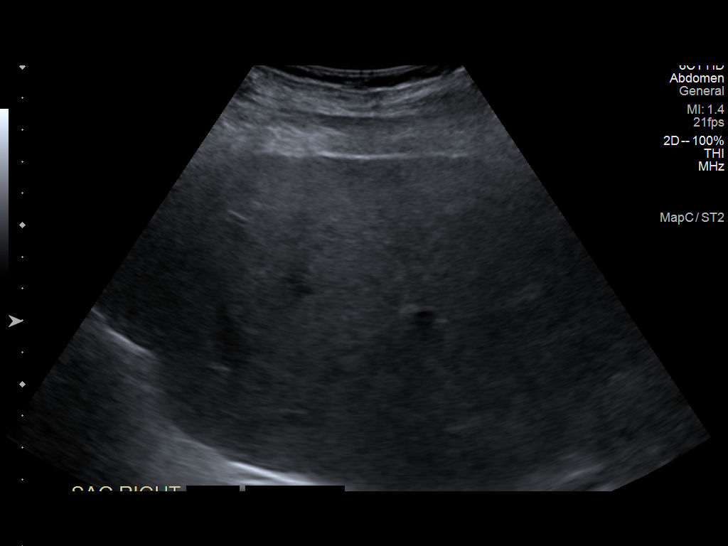
[im 46/56]
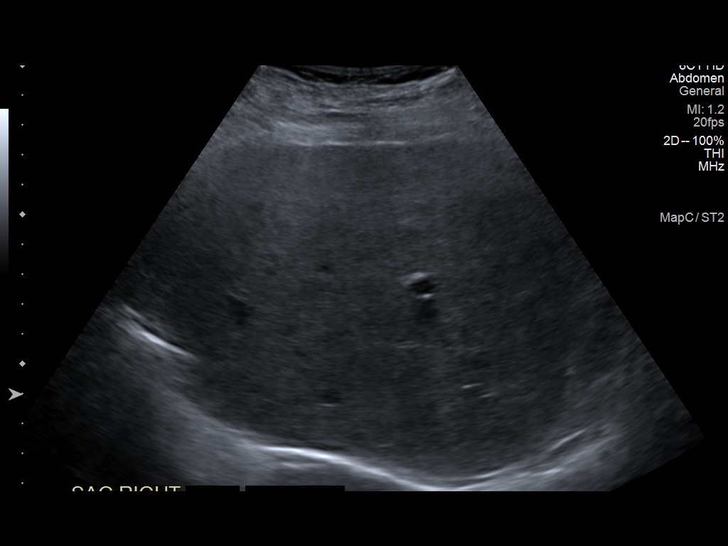
[im 51/56]
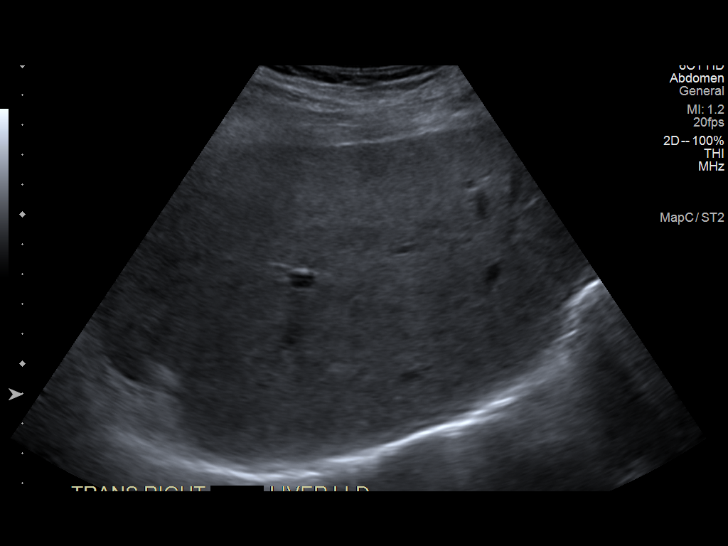
[im 56/56]
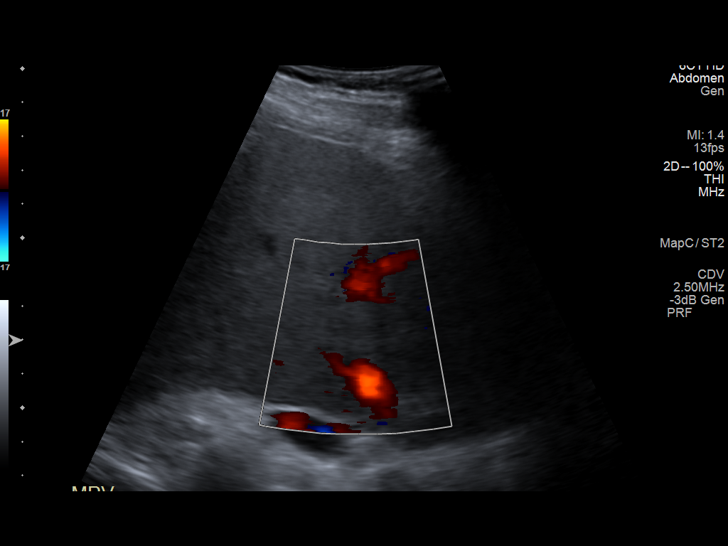

[14 of 25 positions shown; findings below may reference images not displayed]

FINDINGS: Gallbladder:

Multiple stones in the gallbladder lumen. No gallbladder wall
thickening or pericholecystic fluid. Negative sonographic Murphy
sign.

Common bile duct:

Diameter: 4 mm

Liver:

Increased echogenicity. No focal lesion. Portal vein is patent on
color Doppler imaging with normal direction of blood flow towards
the liver.

Other: None.
IMPRESSION: Cholelithiasis without secondary signs of acute cholecystitis.

Increased hepatic parenchymal echogenicity suggestive of steatosis.

## 2023-08-17 ENCOUNTER — Inpatient Hospital Stay
Admission: EM | Admit: 2023-08-17 | Discharge: 2023-08-21 | DRG: 321 | Disposition: A | Discharge status: Home Health | Payer: Medicare Other | Attending: Osteopathic Medicine | Admitting: Osteopathic Medicine

## 2023-08-17 ENCOUNTER — Inpatient Hospital Stay: Payer: Medicare Other

## 2023-08-17 ENCOUNTER — Encounter
Admission: EM | Disposition: A | Discharge status: Home Health | Payer: Self-pay | Source: Home / Self Care | Attending: Osteopathic Medicine

## 2023-08-17 ENCOUNTER — Encounter: Payer: Self-pay | Admitting: Internal Medicine

## 2023-08-17 ENCOUNTER — Emergency Department: Payer: Medicare Other

## 2023-08-17 DIAGNOSIS — I213 ST elevation (STEMI) myocardial infarction of unspecified site: Secondary | ICD-10-CM | POA: Diagnosis not present

## 2023-08-17 DIAGNOSIS — I2699 Other pulmonary embolism without acute cor pulmonale: Secondary | ICD-10-CM | POA: Diagnosis not present

## 2023-08-17 DIAGNOSIS — E669 Obesity, unspecified: Secondary | ICD-10-CM | POA: Diagnosis present

## 2023-08-17 DIAGNOSIS — Z96641 Presence of right artificial hip joint: Secondary | ICD-10-CM | POA: Diagnosis present

## 2023-08-17 DIAGNOSIS — R079 Chest pain, unspecified: Secondary | ICD-10-CM | POA: Diagnosis not present

## 2023-08-17 DIAGNOSIS — R001 Bradycardia, unspecified: Secondary | ICD-10-CM | POA: Diagnosis not present

## 2023-08-17 DIAGNOSIS — Z881 Allergy status to other antibiotic agents status: Secondary | ICD-10-CM | POA: Diagnosis not present

## 2023-08-17 DIAGNOSIS — I472 Ventricular tachycardia, unspecified: Secondary | ICD-10-CM | POA: Diagnosis not present

## 2023-08-17 DIAGNOSIS — I2109 ST elevation (STEMI) myocardial infarction involving other coronary artery of anterior wall: Secondary | ICD-10-CM | POA: Diagnosis present

## 2023-08-17 DIAGNOSIS — R570 Cardiogenic shock: Secondary | ICD-10-CM | POA: Diagnosis not present

## 2023-08-17 DIAGNOSIS — D649 Anemia, unspecified: Secondary | ICD-10-CM | POA: Diagnosis present

## 2023-08-17 DIAGNOSIS — Z79899 Other long term (current) drug therapy: Secondary | ICD-10-CM | POA: Diagnosis not present

## 2023-08-17 DIAGNOSIS — Z888 Allergy status to other drugs, medicaments and biological substances status: Secondary | ICD-10-CM

## 2023-08-17 DIAGNOSIS — Z7901 Long term (current) use of anticoagulants: Secondary | ICD-10-CM | POA: Diagnosis not present

## 2023-08-17 DIAGNOSIS — I10 Essential (primary) hypertension: Secondary | ICD-10-CM | POA: Diagnosis present

## 2023-08-17 DIAGNOSIS — T447X5A Adverse effect of beta-adrenoreceptor antagonists, initial encounter: Secondary | ICD-10-CM | POA: Diagnosis not present

## 2023-08-17 DIAGNOSIS — I255 Ischemic cardiomyopathy: Secondary | ICD-10-CM | POA: Diagnosis present

## 2023-08-17 DIAGNOSIS — I2693 Single subsegmental pulmonary embolism without acute cor pulmonale: Secondary | ICD-10-CM | POA: Diagnosis present

## 2023-08-17 DIAGNOSIS — M545 Low back pain, unspecified: Secondary | ICD-10-CM | POA: Diagnosis present

## 2023-08-17 DIAGNOSIS — Z6826 Body mass index (BMI) 26.0-26.9, adult: Secondary | ICD-10-CM | POA: Diagnosis not present

## 2023-08-17 DIAGNOSIS — I2102 ST elevation (STEMI) myocardial infarction involving left anterior descending coronary artery: Secondary | ICD-10-CM | POA: Diagnosis not present

## 2023-08-17 DIAGNOSIS — I110 Hypertensive heart disease with heart failure: Secondary | ICD-10-CM | POA: Diagnosis present

## 2023-08-17 DIAGNOSIS — M797 Fibromyalgia: Secondary | ICD-10-CM | POA: Diagnosis present

## 2023-08-17 DIAGNOSIS — G894 Chronic pain syndrome: Secondary | ICD-10-CM | POA: Diagnosis present

## 2023-08-17 DIAGNOSIS — N179 Acute kidney failure, unspecified: Secondary | ICD-10-CM | POA: Diagnosis not present

## 2023-08-17 DIAGNOSIS — I2510 Atherosclerotic heart disease of native coronary artery without angina pectoris: Secondary | ICD-10-CM | POA: Diagnosis present

## 2023-08-17 DIAGNOSIS — Z90710 Acquired absence of both cervix and uterus: Secondary | ICD-10-CM

## 2023-08-17 DIAGNOSIS — I5032 Chronic diastolic (congestive) heart failure: Secondary | ICD-10-CM | POA: Diagnosis present

## 2023-08-17 DIAGNOSIS — I952 Hypotension due to drugs: Secondary | ICD-10-CM

## 2023-08-17 HISTORY — PX: LEFT HEART CATH AND CORONARY ANGIOGRAPHY: CATH118249

## 2023-08-17 HISTORY — PX: CORONARY IMAGING/OCT: CATH118326

## 2023-08-17 HISTORY — PX: CORONARY/GRAFT ACUTE MI REVASCULARIZATION: CATH118305

## 2023-08-17 LAB — MRSA NEXT GEN BY PCR, NASAL: MRSA by PCR Next Gen: NOT DETECTED

## 2023-08-17 LAB — BASIC METABOLIC PANEL
Anion gap: 9 (ref 5–15)
BUN: 44 mg/dL — ABNORMAL HIGH (ref 8–23)
CO2: 21 mmol/L — ABNORMAL LOW (ref 22–32)
Calcium: 8.9 mg/dL (ref 8.9–10.3)
Chloride: 105 mmol/L (ref 98–111)
Creatinine, Ser: 1.23 mg/dL — ABNORMAL HIGH (ref 0.44–1.00)
GFR, Estimated: 50 mL/min — ABNORMAL LOW (ref >60)
Glucose, Bld: 145 mg/dL — ABNORMAL HIGH (ref 70–99)
Potassium: 4.6 mmol/L (ref 3.5–5.1)
Sodium: 135 mmol/L (ref 135–145)

## 2023-08-17 LAB — CREATININE, SERUM
Creatinine, Ser: 0.92 mg/dL (ref 0.44–1.00)
GFR, Estimated: >60 mL/min (ref >60)

## 2023-08-17 LAB — CBC
HCT: 42.2 % (ref 36.0–46.0)
Hemoglobin: 14 g/dL (ref 12.0–15.0)
MCH: 30 pg (ref 26.0–34.0)
MCHC: 33.2 g/dL (ref 30.0–36.0)
MCV: 90.4 fL (ref 80.0–100.0)
Platelets: 271 10*3/uL (ref 150–400)
RBC: 4.67 MIL/uL (ref 3.87–5.11)
RDW: 13.2 % (ref 11.5–15.5)
WBC: 14.7 10*3/uL — ABNORMAL HIGH (ref 4.0–10.5)
nRBC: 0 % (ref 0.0–0.2)

## 2023-08-17 LAB — LIPID PANEL
Cholesterol: 116 mg/dL (ref 0–200)
HDL: 52 mg/dL (ref >40)
LDL Cholesterol: 53 mg/dL (ref 0–99)
Total CHOL/HDL Ratio: 2.2 {ratio}
Triglycerides: 54 mg/dL (ref <150)
VLDL: 11 mg/dL (ref 0–40)

## 2023-08-17 LAB — POCT ACTIVATED CLOTTING TIME
Activated Clotting Time: 221 s
Activated Clotting Time: 250 s
Activated Clotting Time: 279 s

## 2023-08-17 LAB — APTT
aPTT: >200 s (ref 24–36)
aPTT: 75 s — ABNORMAL HIGH (ref 24–36)

## 2023-08-17 LAB — GLUCOSE, CAPILLARY: Glucose-Capillary: 120 mg/dL — ABNORMAL HIGH (ref 70–99)

## 2023-08-17 LAB — PROTIME-INR
INR: 1.3 — ABNORMAL HIGH (ref 0.8–1.2)
Prothrombin Time: 16.1 s — ABNORMAL HIGH (ref 11.4–15.2)

## 2023-08-17 LAB — TROPONIN I (HIGH SENSITIVITY)
Troponin I (High Sensitivity): 71 ng/L — ABNORMAL HIGH (ref <18)
Troponin I (High Sensitivity): 2384 ng/L (ref <18)

## 2023-08-17 SURGERY — CORONARY/GRAFT ACUTE MI REVASCULARIZATION
Anesthesia: Moderate Sedation

## 2023-08-17 MED ORDER — PRASUGREL HCL 10 MG PO TABS
ORAL_TABLET | ORAL | Status: AC
  Filled 2023-08-17: qty 6

## 2023-08-17 MED ORDER — TRAMADOL HCL 50 MG PO TABS
100.0000 mg | ORAL_TABLET | Freq: Two times a day (BID) | ORAL | Status: DC | PRN
  Administered 2023-08-17 – 2023-08-19 (×2): 100 mg via ORAL
  Filled 2023-08-17 (×2): qty 2

## 2023-08-17 MED ORDER — CHLORHEXIDINE GLUCONATE CLOTH 2 % EX PADS
6.0000 | MEDICATED_PAD | Freq: Every day | CUTANEOUS | Status: DC
  Administered 2023-08-18 – 2023-08-21 (×4): 6 via TOPICAL

## 2023-08-17 MED ORDER — ONDANSETRON HCL 4 MG/2ML IJ SOLN
INTRAMUSCULAR | Status: AC
  Filled 2023-08-17: qty 2

## 2023-08-17 MED ORDER — MIDAZOLAM HCL 2 MG/2ML IJ SOLN
INTRAMUSCULAR | Status: AC
Start: 2023-08-17 — End: ?
  Filled 2023-08-17: qty 2

## 2023-08-17 MED ORDER — HEPARIN (PORCINE) IN NACL 1000-0.9 UT/500ML-% IV SOLN
INTRAVENOUS | Status: AC
  Filled 2023-08-17: qty 1000

## 2023-08-17 MED ORDER — VERAPAMIL HCL 2.5 MG/ML IV SOLN
INTRAVENOUS | Status: DC | PRN
  Administered 2023-08-17 (×2): 2.5 mg via INTRAVENOUS

## 2023-08-17 MED ORDER — PRASUGREL HCL 10 MG PO TABS: 10.0000 mg | ORAL_TABLET | Freq: Every day | ORAL | Status: DC

## 2023-08-17 MED ORDER — NITROGLYCERIN 1 MG/10 ML FOR IR/CATH LAB
INTRA_ARTERIAL | Status: DC | PRN
  Administered 2023-08-17 (×2): 100 ug

## 2023-08-17 MED ORDER — IOHEXOL 350 MG/ML SOLN
75.0000 mL | Freq: Once | INTRAVENOUS | Status: AC | PRN
  Administered 2023-08-17: 75 mL via INTRAVENOUS

## 2023-08-17 MED ORDER — SODIUM CHLORIDE 0.9% FLUSH
3.0000 mL | Freq: Two times a day (BID) | INTRAVENOUS | Status: DC
  Administered 2023-08-17 – 2023-08-21 (×8): 3 mL via INTRAVENOUS

## 2023-08-17 MED ORDER — TIROFIBAN (AGGRASTAT) BOLUS VIA INFUSION
INTRAVENOUS | Status: DC | PRN
  Administered 2023-08-17: 1760 ug via INTRAVENOUS

## 2023-08-17 MED ORDER — TIZANIDINE HCL 4 MG PO TABS
4.0000 mg | ORAL_TABLET | Freq: Three times a day (TID) | ORAL | Status: DC | PRN
  Administered 2023-08-19: 4 mg via ORAL
  Filled 2023-08-17 (×2): qty 1

## 2023-08-17 MED ORDER — GABAPENTIN 400 MG PO CAPS
1200.0000 mg | ORAL_CAPSULE | Freq: Two times a day (BID) | ORAL | Status: DC
  Administered 2023-08-17 – 2023-08-19 (×4): 1200 mg via ORAL
  Filled 2023-08-17 (×4): qty 3

## 2023-08-17 MED ORDER — LIDOCAINE HCL (PF) 1 % IJ SOLN
INTRAMUSCULAR | Status: DC | PRN
  Administered 2023-08-17: 2 mL

## 2023-08-17 MED ORDER — HYDRALAZINE HCL 20 MG/ML IJ SOLN: 10.0000 mg | INTRAMUSCULAR | Status: AC | PRN

## 2023-08-17 MED ORDER — NITROGLYCERIN 1 MG/10 ML FOR IR/CATH LAB
INTRA_ARTERIAL | Status: AC
  Filled 2023-08-17: qty 10

## 2023-08-17 MED ORDER — HEPARIN SODIUM (PORCINE) 1000 UNIT/ML IJ SOLN
INTRAMUSCULAR | Status: AC
Start: 2023-08-17 — End: ?
  Filled 2023-08-17: qty 10

## 2023-08-17 MED ORDER — TIROFIBAN HCL IV 12.5 MG/250 ML
INTRAVENOUS | Status: AC | PRN
  Administered 2023-08-17: .15 ug/kg/min via INTRAVENOUS

## 2023-08-17 MED ORDER — OXYCODONE HCL 5 MG PO TABS: 10.0000 mg | ORAL_TABLET | Freq: Four times a day (QID) | ORAL | Status: DC | PRN

## 2023-08-17 MED ORDER — MIDAZOLAM HCL 2 MG/2ML IJ SOLN
INTRAMUSCULAR | Status: DC | PRN
  Administered 2023-08-17: 1 mg via INTRAVENOUS

## 2023-08-17 MED ORDER — PRASUGREL HCL 10 MG PO TABS
ORAL_TABLET | ORAL | Status: DC | PRN
  Administered 2023-08-17: 60 mg via ORAL

## 2023-08-17 MED ORDER — IOHEXOL 300 MG/ML  SOLN
INTRAMUSCULAR | Status: DC | PRN
  Administered 2023-08-17: 195 mL

## 2023-08-17 MED ORDER — FENTANYL CITRATE (PF) 100 MCG/2ML IJ SOLN
INTRAMUSCULAR | Status: AC
  Filled 2023-08-17: qty 2

## 2023-08-17 MED ORDER — ONDANSETRON HCL 4 MG/2ML IJ SOLN
INTRAMUSCULAR | Status: DC | PRN
  Administered 2023-08-17: 4 mg via INTRAVENOUS

## 2023-08-17 MED ORDER — HEPARIN SODIUM (PORCINE) 5000 UNIT/ML IJ SOLN
4000.0000 [IU] | Freq: Once | INTRAMUSCULAR | Status: AC
  Administered 2023-08-17: 4000 [IU] via INTRAVENOUS

## 2023-08-17 MED ORDER — OXYCODONE HCL 5 MG PO TABS
10.0000 mg | ORAL_TABLET | Freq: Four times a day (QID) | ORAL | Status: DC | PRN
  Administered 2023-08-18 – 2023-08-19 (×4): 10 mg via ORAL
  Filled 2023-08-17 (×4): qty 2

## 2023-08-17 MED ORDER — ENOXAPARIN SODIUM 40 MG/0.4ML IJ SOSY
40.0000 mg | PREFILLED_SYRINGE | INTRAMUSCULAR | Status: DC
Start: 2023-08-18 — End: 2023-08-18

## 2023-08-17 MED ORDER — FENTANYL CITRATE (PF) 100 MCG/2ML IJ SOLN
INTRAMUSCULAR | Status: DC | PRN
  Administered 2023-08-17 (×3): 25 ug via INTRAVENOUS

## 2023-08-17 MED ORDER — HEPARIN (PORCINE) IN NACL 1000-0.9 UT/500ML-% IV SOLN
INTRAVENOUS | Status: DC | PRN
  Administered 2023-08-17 (×2): 500 mL

## 2023-08-17 MED ORDER — ADENOSINE 6 MG/2ML IV SOLN: INTRAVENOUS | Status: DC | PRN

## 2023-08-17 MED ORDER — ONDANSETRON HCL 4 MG/2ML IJ SOLN
4.0000 mg | Freq: Four times a day (QID) | INTRAMUSCULAR | Status: DC | PRN
  Administered 2023-08-18 – 2023-08-20 (×4): 4 mg via INTRAVENOUS
  Filled 2023-08-17 (×6): qty 2

## 2023-08-17 MED ORDER — SODIUM CHLORIDE 0.9% FLUSH
3.0000 mL | INTRAVENOUS | Status: DC | PRN
Start: 2023-08-17 — End: 2023-08-21

## 2023-08-17 MED ORDER — LOSARTAN POTASSIUM 50 MG PO TABS: 50.0000 mg | ORAL_TABLET | Freq: Every day | ORAL | Status: DC

## 2023-08-17 MED ORDER — ADENOSINE 6 MG/2ML IV SOLN
INTRAVENOUS | Status: AC
  Filled 2023-08-17: qty 2

## 2023-08-17 MED ORDER — ASPIRIN 81 MG PO CHEW
81.0000 mg | CHEWABLE_TABLET | Freq: Every day | ORAL | Status: DC
  Administered 2023-08-18 – 2023-08-21 (×4): 81 mg via ORAL
  Filled 2023-08-17 (×4): qty 1

## 2023-08-17 MED ORDER — GABAPENTIN 600 MG PO TABS: 600.0000 mg | ORAL_TABLET | Freq: Four times a day (QID) | ORAL | Status: DC

## 2023-08-17 MED ORDER — ACETAMINOPHEN 325 MG PO TABS
650.0000 mg | ORAL_TABLET | ORAL | Status: DC | PRN
  Administered 2023-08-19 – 2023-08-20 (×2): 650 mg via ORAL
  Filled 2023-08-17 (×2): qty 2

## 2023-08-17 MED ORDER — TIROFIBAN HCL IV 12.5 MG/250 ML
0.0750 ug/kg/min | INTRAVENOUS | Status: AC
  Administered 2023-08-17: 0.075 ug/kg/min via INTRAVENOUS
  Filled 2023-08-17: qty 250

## 2023-08-17 MED ORDER — SODIUM CHLORIDE 0.9 % IV SOLN: INTRAVENOUS | Status: DC

## 2023-08-17 MED ORDER — SODIUM CHLORIDE 0.9 % IV SOLN: 250.0000 mL | INTRAVENOUS | Status: AC | PRN

## 2023-08-17 MED ORDER — VERAPAMIL HCL 2.5 MG/ML IV SOLN
INTRAVENOUS | Status: AC
  Filled 2023-08-17: qty 2

## 2023-08-17 MED ORDER — HEPARIN SODIUM (PORCINE) 1000 UNIT/ML IJ SOLN
INTRAMUSCULAR | Status: DC | PRN
  Administered 2023-08-17: 3000 [IU] via INTRAVENOUS
  Administered 2023-08-17: 2000 [IU] via INTRAVENOUS

## 2023-08-17 MED ORDER — HEPARIN (PORCINE) IN NACL 2000-0.9 UNIT/L-% IV SOLN
INTRAVENOUS | Status: DC | PRN
  Administered 2023-08-17: 1000 mL

## 2023-08-17 MED ORDER — TIROFIBAN HCL IN NACL 5-0.9 MG/100ML-% IV SOLN
INTRAVENOUS | Status: AC
  Filled 2023-08-17: qty 100

## 2023-08-17 MED ORDER — ADENOSINE (DIAGNOSTIC) FOR INTRACORONARY USE
INTRAVENOUS | Status: DC | PRN
  Administered 2023-08-17 (×3): 120 ug via INTRACORONARY

## 2023-08-17 MED ORDER — ATORVASTATIN CALCIUM 20 MG PO TABS
80.0000 mg | ORAL_TABLET | Freq: Every day | ORAL | Status: DC
  Administered 2023-08-18 (×2): 80 mg via ORAL
  Filled 2023-08-17 (×2): qty 4

## 2023-08-17 MED ORDER — FENTANYL CITRATE PF 50 MCG/ML IJ SOSY: 50.0000 ug | PREFILLED_SYRINGE | Freq: Once | INTRAMUSCULAR | Status: DC

## 2023-08-17 MED ORDER — LABETALOL HCL 5 MG/ML IV SOLN: 10.0000 mg | INTRAVENOUS | Status: AC | PRN

## 2023-08-17 SURGICAL SUPPLY — 21 items
BALLN EUPHORA RX 2.0X12 (BALLOONS) ×1
BALLOON EUPHORA RX 2.0X12 (BALLOONS) IMPLANT
CATH DRAGONFLY OPSTAR (CATHETERS) IMPLANT
CATH INFINITI 5 FR JL3.5 (CATHETERS) IMPLANT
CATH INFINITI JR4 5F (CATHETERS) IMPLANT
CATH LAUNCHER 6FR EBU3.5 (CATHETERS) IMPLANT
CATH VISTA GUIDE 6FR JR4 (CATHETERS) IMPLANT
DEVICE RAD TR BAND REGULAR (VASCULAR PRODUCTS) IMPLANT
DRAPE BRACHIAL (DRAPES) IMPLANT
GLIDESHEATH SLEND SS 6F .021 (SHEATH) IMPLANT
GUIDEWIRE INQWIRE 1.5J.035X260 (WIRE) IMPLANT
INQWIRE 1.5J .035X260CM (WIRE) ×1
KIT ENCORE 26 ADVANTAGE (KITS) IMPLANT
KIT SYRINGE INJ CVI SPIKEX1 (MISCELLANEOUS) IMPLANT
PACK CARDIAC CATH (CUSTOM PROCEDURE TRAY) ×1 IMPLANT
PROTECTION STATION PRESSURIZED (MISCELLANEOUS) ×1
SET ATX-X65L (MISCELLANEOUS) IMPLANT
STATION PROTECTION PRESSURIZED (MISCELLANEOUS) IMPLANT
STENT ONYX FRONTIER 4.0X15 (Permanent Stent) IMPLANT
TUBING CIL FLEX 10 FLL-RA (TUBING) IMPLANT
WIRE RUNTHROUGH .014X180CM (WIRE) IMPLANT

## 2023-08-17 NOTE — Assessment & Plan Note (Addendum)
.  Patient with substernal chest pain that came on acutely not improving with NTG.  -Initial HS troponin mildly high : Cardiac Panel (last 3 results) Recent Labs    08/17/23 1800  TROPONINIHS 71*      -EKG with apparent anterolateral ST depression of uncertain duration, no apparent STEMI. -TIMI risk score is 4; which predicts a 14 days risk of death, recurrent MI, or urgent revascularization of 19.9%.  -Will plan to observe in progressive care unit at [facility] on telemetry to further evaluate for ACS.  -Will admit since the patient has positive troponins and/or an abnormal EKG with angina necessitating acute intervention. -Repeat EKG in AM -Risk factor stratification with HgbA1c and lipid ,TSH,FT4. -Cardiology Dr.END  -Start ASA 81 mg daily, losartan, Lovenox for DVT,Prasugrel per cardiology and additional antiplatelet therapy.

## 2023-08-17 NOTE — H&P (Signed)
History and Physical    Patient: Alicia Moyer MWU:[id number] DOB: 15-Mar-1961 DOA: 08/17/2023 DOS: the patient was seen and examined on 08/18/2023 PCP: Marina Goodell, MD  Patient coming from: Home  Chief Complaint:  Chief Complaint  Patient presents with   Code STEMI    HPI: LUVADA SANDINE is a 62 y.o. female with medical history significant for scoliosis, arthritis, fibromyalgia, multiple hip replacement on the right coming for chest pain nausea shortness of breath, C/P radiating to both arms.   Chronic pain meds at home include tramadol oxycodone and gabapentin have discussed with pharmacy to start the same medication regimen and same frequency and we will hold any additional IV pain meds while in the hospital.  Patient called 911 and on arrival MS stat EKG inferior to elevation and code STEMI  activated patient received aspirin and nitroglycerin with fentanyl for chest pain via EMS upon arrival patient continued to have 10 out of 10 chest pain despite aphasic aspirin and nitroglycerin and fentanyl was taken emergently to Cath Lab.  Patient underwent an emergent left heart cath showing acute plaque rupture with nonocclusive thrombus in the proximal LAD embolized thrombus with total occlusion of the apical LAD also seen and patient underwent primary PCI of the LAD was restored to the apical LAD.  Patient transported to ICU under cardiac service hospitalist service requested to take over as admitting service.  In emergency room vitals trend shows: Vitals:   08/17/23 2300 08/18/23 0000 08/18/23 0100 08/18/23 0200  BP: (!) 148/99 122/78 108/74 123/81  Pulse: 74 92 85 84  Temp:      Resp: 11 16 (!) 22 (!) 21  Height:      Weight:      SpO2: 100% 96% 94% 95%  TempSrc:       Labs are notable for : Basic metabolic panel showing acute kidney injury with a creatinine of 1.23 GFR 50 glucose of 145 normal, electrolytes otherwise normal and BUN of 44 LFTs added and pending. Leukocytosis of  14.7 normal white count and normal platelet count. Initial EKG shows ST elevation in 2 3 aVF, short PR at 107. Echocardiogram 2023 showed: 1. Left ventricular ejection fraction, by estimation, is 60 to 65%. The  left ventricle has normal function. The left ventricle has no regional  wall motion abnormalities. Left ventricular diastolic parameters are  consistent with Grade II diastolic  dysfunction (pseudonormalization).   2. Right ventricular systolic function is normal. The right ventricular  size is normal.   3. The mitral valve is normal in structure. Mild mitral valve  regurgitation.   4. The aortic valve is normal in structure. Aortic valve regurgitation is  not visualized.   In the ED pt received: Medications  fentaNYL (SUBLIMAZE) injection 50 mcg ( Intravenous MAR Unhold 08/17/23 1950)  tiZANidine (ZANAFLEX) tablet 4 mg (has no administration in time range)  labetalol (NORMODYNE) injection 10 mg (has no administration in time range)  hydrALAZINE (APRESOLINE) injection 10 mg (has no administration in time range)  acetaminophen (TYLENOL) tablet 650 mg (has no administration in time range)  ondansetron (ZOFRAN) injection 4 mg (has no administration in time range)  sodium chloride flush (NS) 0.9 % injection 3 mL (3 mLs Intravenous Given 08/17/23 2357)  sodium chloride flush (NS) 0.9 % injection 3 mL (has no administration in time range)  0.9 %  sodium chloride infusion (has no administration in time range)  aspirin chewable tablet 81 mg (has no administration in time range)  prasugrel (EFFIENT) tablet 10 mg (has no administration in time range)  Chlorhexidine Gluconate Cloth 2 % PADS 6 each (has no administration in time range)  gabapentin (NEURONTIN) capsule 1,200 mg (1,200 mg Oral Given 08/17/23 2127)  oxyCODONE (Oxy IR/ROXICODONE) immediate release tablet 10 mg (has no administration in time range)  traMADol (ULTRAM) tablet 100 mg (100 mg Oral Given 08/17/23 2117)  tirofiban  (AGGRASTAT) infusion 50 mcg/mL 250 mL (0.075 mcg/kg/min  70.4 kg Intravenous New Bag/Given 08/17/23 2208)  atorvastatin (LIPITOR) tablet 80 mg (80 mg Oral Given 08/18/23 0037)  heparin injection 4,000 Units ( Intravenous MAR Unhold 08/17/23 1950)  tirofiban (AGGRASTAT) infusion 50 mcg/mL 250 mL (0.15 mcg/kg/min  70.4 kg Intravenous New Bag/Given 08/17/23 1841)  iohexol (OMNIPAQUE) 350 MG/ML injection 75 mL (75 mLs Intravenous Contrast Given 08/17/23 2254)   Review of Systems  Respiratory:  Positive for shortness of breath.        Trip to the beach.   Cardiovascular:  Positive for chest pain and leg swelling.  Gastrointestinal:  Positive for nausea.  Neurological:  Positive for dizziness.   Past Medical History:  Diagnosis Date   Arthritis    CHF (congestive heart failure) (HCC)    Chronic pain    CSF abnormal 07/15/2014   Fibromyalgia 07/15/2014   Hypertension    Scoliosis    Past Surgical History:  Procedure Laterality Date   ABDOMINAL HYSTERECTOMY  2000   DILATION AND CURETTAGE OF UTERUS     Hip replacement right     x6    reports that she has never smoked. She does not have any smokeless tobacco history on file. She reports that she does not drink alcohol and does not use drugs.  Allergies  Allergen Reactions   Clindamycin/Lincomycin Anaphylaxis    Rash,swelling of face and neck   Zolpidem Other (See Comments)    Family History  Problem Relation Age of Onset   Aneurysm Other         grandmother    Prior to Admission medications   Medication Sig Start Date End Date Taking? Authorizing Provider  furosemide (LASIX) 20 MG tablet Take 1 tablet (20 mg total) by mouth daily. Patient not taking: Reported on 05/29/2022 12/31/21   Delma Freeze, FNP  gabapentin (NEURONTIN) 600 MG tablet Take 1,200 mg by mouth 2 (two) times daily. 04/23/21   [provider]  losartan (COZAAR) 25 MG tablet Take 1 tablet (25 mg total) by mouth daily. 12/31/21   Delma Freeze, FNP   oxyCODONE ER [facility] ER) 27 MG C12A Xtampza ER 27 mg capsule sprinkle  1 BID Fill on 05/19/2022    [provider]  potassium chloride (KLOR-CON) 10 MEQ tablet Take 1 tablet (10 mEq total) by mouth daily. Patient not taking: Reported on 05/29/2022 12/31/21 04/24/22  Delma Freeze, FNP  tapentadol HCl (NUCYNTA) 75 MG tablet Take 75 mg by mouth 3 (three) times daily as needed (breakthrough).    [provider]  tiZANidine (ZANAFLEX) 4 MG tablet Take 4 mg by mouth 3 (three) times daily. 03/20/22   [provider]   Vitals:   08/17/23 2300 08/18/23 0000 08/18/23 0100 08/18/23 0200  BP: (!) 148/99 122/78 108/74 123/81  Pulse: 74 92 85 84  Resp: 11 16 (!) 22 (!) 21  Temp:      TempSrc:      SpO2: 100% 96% 94% 95%  Weight:      Height:       Physical  Exam Vitals and nursing note reviewed.  Constitutional:      General: She is not in acute distress. HENT:     Head: Normocephalic and atraumatic.     Right Ear: Hearing normal.     Left Ear: Hearing normal.     Nose: Nose normal. No nasal deformity.     Mouth/Throat:     Lips: Pink.     Tongue: No lesions.     Pharynx: Oropharynx is clear.  Eyes:     General: Lids are normal.     Extraocular Movements: Extraocular movements intact.  Cardiovascular:     Rate and Rhythm: Normal rate and regular rhythm.     Heart sounds: Normal heart sounds.  Pulmonary:     Effort: Pulmonary effort is normal.     Breath sounds: Normal breath sounds.  Abdominal:     General: Bowel sounds are normal. There is no distension.     Palpations: Abdomen is soft. There is no mass.     Tenderness: There is no abdominal tenderness.  Musculoskeletal:     Right lower leg: No edema.     Left lower leg: No edema.  Skin:    General: Skin is warm.  Neurological:     General: No focal deficit present.     Mental Status: She is alert and oriented to person, place, and time.     Cranial Nerves: Cranial nerves 2-12 are intact.   Psychiatric:        Attention and Perception: Attention normal.        Mood and Affect: Mood normal.        Speech: Speech normal.        Behavior: Behavior normal. Behavior is cooperative.    Labs on Admission: I have personally reviewed following labs and imaging studies Results for orders placed or performed during the hospital encounter of 08/17/23 (from the past 24 hour(s))  Basic metabolic panel     Status: Abnormal   Collection Time: 08/17/23  6:00 PM  Result Value Ref Range   Sodium 135 135 - 145 mmol/L   Potassium 4.6 3.5 - 5.1 mmol/L   Chloride 105 98 - 111 mmol/L   CO2 21 (L) 22 - 32 mmol/L   Glucose, Bld 145 (H) 70 - 99 mg/dL   BUN 44 (H) 8 - 23 mg/dL   Creatinine, Ser 0.98 (H) 0.44 - 1.00 mg/dL   Calcium 8.9 8.9 - 11.9 mg/dL   GFR, Estimated 50 (L) >60 mL/min   Anion gap 9 5 - 15  CBC     Status: Abnormal   Collection Time: 08/17/23  6:00 PM  Result Value Ref Range   WBC 14.7 (H) 4.0 - 10.5 K/uL   RBC 4.67 3.87 - 5.11 MIL/uL   Hemoglobin 14.0 12.0 - 15.0 g/dL   HCT 14.7 82.9 - 56.2 %   MCV 90.4 80.0 - 100.0 fL   MCH 30.0 26.0 - 34.0 pg   MCHC 33.2 30.0 - 36.0 g/dL   RDW 13.0 86.5 - 78.4 %   Platelets 271 150 - 400 K/uL   nRBC 0.0 0.0 - 0.2 %  Troponin I (High Sensitivity)     Status: Abnormal   Collection Time: 08/17/23  6:00 PM  Result Value Ref Range   Troponin I (High Sensitivity) 71 (H) <18 ng/L  POCT Activated clotting time     Status: None   Collection Time: 08/17/23  6:48 PM  Result Value Ref Range  Activated Clotting Time 279 seconds  POCT Activated clotting time     Status: None   Collection Time: 08/17/23  7:04 PM  Result Value Ref Range   Activated Clotting Time 221 seconds  POCT Activated clotting time     Status: None   Collection Time: 08/17/23  7:19 PM  Result Value Ref Range   Activated Clotting Time 250 seconds  Glucose, capillary     Status: Abnormal   Collection Time: 08/17/23  8:05 PM  Result Value Ref Range   Glucose-Capillary  120 (H) 70 - 99 mg/dL  MRSA Next Gen by PCR, Nasal     Status: None   Collection Time: 08/17/23  8:15 PM   Specimen: Nasal Mucosa; Nasal Swab  Result Value Ref Range   MRSA by PCR Next Gen NOT DETECTED NOT DETECTED  Protime-INR     Status: Abnormal   Collection Time: 08/17/23  9:38 PM  Result Value Ref Range   Prothrombin Time 16.1 (H) 11.4 - 15.2 seconds   INR 1.3 (H) 0.8 - 1.2  APTT     Status: Abnormal   Collection Time: 08/17/23  9:38 PM  Result Value Ref Range   aPTT >200 (HH) 24 - 36 seconds  Lipid panel     Status: None   Collection Time: 08/17/23  9:38 PM  Result Value Ref Range   Cholesterol 116 0 - 200 mg/dL   Triglycerides 54 <161 mg/dL   HDL 52 >09 mg/dL   Total CHOL/HDL Ratio 2.2 RATIO   VLDL 11 0 - 40 mg/dL   LDL Cholesterol 53 0 - 99 mg/dL  Troponin I (High Sensitivity)     Status: Abnormal   Collection Time: 08/17/23  9:38 PM  Result Value Ref Range   Troponin I (High Sensitivity) 2,384 (HH) <18 ng/L  Creatinine, serum     Status: None   Collection Time: 08/17/23  9:38 PM  Result Value Ref Range   Creatinine, Ser 0.92 0.44 - 1.00 mg/dL   GFR, Estimated >60 >45 mL/min  APTT     Status: Abnormal   Collection Time: 08/17/23 11:17 PM  Result Value Ref Range   aPTT 75 (H) 24 - 36 seconds   CBC: Recent Labs  Lab 08/17/23 1800  WBC 14.7*  HGB 14.0  HCT 42.2  MCV 90.4  PLT 271   Basic Metabolic Panel: Recent Labs  Lab 08/17/23 1800 08/17/23 2138  NA 135  --   K 4.6  --   CL 105  --   CO2 21*  --   GLUCOSE 145*  --   BUN 44*  --   CREATININE 1.23* 0.92  CALCIUM 8.9  --    GFR: Estimated Creatinine Clearance: 61.1 mL/min (by C-G formula based on SCr of 0.92 mg/dL). Liver Function Tests: No results for input(s): "AST", "ALT", "ALKPHOS", "BILITOT", "PROT", "ALBUMIN" in the last 168 hours. No results for input(s): "LIPASE", "AMYLASE" in the last 168 hours. No results for input(s): "AMMONIA" in the last 168 hours. Coagulation Profile: Recent Labs   Lab 08/17/23 2138  INR 1.3*   Cardiac Enzymes: No results for input(s): "CKTOTAL", "CKMB", "CKMBINDEX", "TROPONINI" in the last 168 hours. BNP (last 3 results) No results for input(s): "PROBNP" in the last 8760 hours. HbA1C: No results for input(s): "HGBA1C" in the last 72 hours. CBG: Recent Labs  Lab 08/17/23 2005  GLUCAP 120*   Lipid Profile: Recent Labs    08/17/23 2138  CHOL 116  HDL 52  LDLCALC 53  TRIG 54  CHOLHDL 2.2   Thyroid Function Tests: No results for input(s): "TSH", "T4TOTAL", "FREET4", "T3FREE", "THYROIDAB" in the last 72 hours. Anemia Panel: No results for input(s): "VITAMINB12", "FOLATE", "FERRITIN", "TIBC", "IRON", "RETICCTPCT" in the last 72 hours. Urinalysis    Component Value Date/Time   COLORURINE YELLOW (A) 12/25/2021 1833   APPEARANCEUR HAZY (A) 12/25/2021 1833   LABSPEC 1.014 12/25/2021 1833   PHURINE 5.0 12/25/2021 1833   GLUCOSEU NEGATIVE 12/25/2021 1833   HGBUR LARGE (A) 12/25/2021 1833   BILIRUBINUR NEGATIVE 12/25/2021 1833   KETONESUR NEGATIVE 12/25/2021 1833   PROTEINUR NEGATIVE 12/25/2021 1833   NITRITE NEGATIVE 12/25/2021 1833   LEUKOCYTESUR MODERATE (A) 12/25/2021 1833   Unresulted Labs (From admission, onward)     Start     Ordered   08/18/23 0500  Basic metabolic panel  Tomorrow morning,   R        08/17/23 2035   08/18/23 0500  CBC  Tomorrow morning,   R        08/17/23 2035   08/18/23 0500  Lipoprotein A (LPA)  Tomorrow morning,   R        08/17/23 2035   08/18/23 0203  Hepatic function panel  Add-on,   AD        08/18/23 0202   08/17/23 1804  Hemoglobin A1c  (Stemi Panel (PNL))  ONCE - URGENT,   URGENT        08/17/23 1805           Medications  fentaNYL (SUBLIMAZE) injection 50 mcg ( Intravenous MAR Unhold 08/17/23 1950)  tiZANidine (ZANAFLEX) tablet 4 mg (has no administration in time range)  labetalol (NORMODYNE) injection 10 mg (has no administration in time range)  hydrALAZINE (APRESOLINE) injection 10  mg (has no administration in time range)  acetaminophen (TYLENOL) tablet 650 mg (has no administration in time range)  ondansetron (ZOFRAN) injection 4 mg (has no administration in time range)  sodium chloride flush (NS) 0.9 % injection 3 mL (3 mLs Intravenous Given 08/17/23 2357)  sodium chloride flush (NS) 0.9 % injection 3 mL (has no administration in time range)  0.9 %  sodium chloride infusion (has no administration in time range)  aspirin chewable tablet 81 mg (has no administration in time range)  prasugrel (EFFIENT) tablet 10 mg (has no administration in time range)  Chlorhexidine Gluconate Cloth 2 % PADS 6 each (has no administration in time range)  gabapentin (NEURONTIN) capsule 1,200 mg (1,200 mg Oral Given 08/17/23 2127)  oxyCODONE (Oxy IR/ROXICODONE) immediate release tablet 10 mg (has no administration in time range)  traMADol (ULTRAM) tablet 100 mg (100 mg Oral Given 08/17/23 2117)  tirofiban (AGGRASTAT) infusion 50 mcg/mL 250 mL (0.075 mcg/kg/min  70.4 kg Intravenous New Bag/Given 08/17/23 2208)  atorvastatin (LIPITOR) tablet 80 mg (80 mg Oral Given 08/18/23 0037)  heparin injection 4,000 Units ( Intravenous MAR Unhold 08/17/23 1950)  tirofiban (AGGRASTAT) infusion 50 mcg/mL 250 mL (0.15 mcg/kg/min  70.4 kg Intravenous New Bag/Given 08/17/23 1841)  iohexol (OMNIPAQUE) 350 MG/ML injection 75 mL (75 mLs Intravenous Contrast Given 08/17/23 2254)   Radiological Exams on Admission: CT Angio Chest Pulmonary Embolism (PE) W or WO Contrast  Result Date: 08/17/2023 CLINICAL DATA:  Pulmonary embolism (PE) suspected, high prob. Dizzy, chest pain, sob, lower extremity swelling. Hip surgery EXAM: CT ANGIOGRAPHY CHEST WITH CONTRAST TECHNIQUE: Multidetector CT imaging of the chest was performed using the standard protocol during bolus administration of intravenous contrast. Multiplanar CT  image reconstructions and MIPs were obtained to evaluate the vascular anatomy. RADIATION DOSE REDUCTION:  This exam was performed according to the departmental dose-optimization program which includes automated exposure control, adjustment of the mA and/or kV according to patient size and/or use of iterative reconstruction technique. CONTRAST:  75mL OMNIPAQUE IOHEXOL 350 MG/ML SOLN COMPARISON:  None Available. FINDINGS: Cardiovascular: Satisfactory opacification of the pulmonary arteries to the segmental level. Right upper lobe segmental and subsegmental pulmonary emboli. Normal heart size. No significant pericardial effusion. The thoracic aorta is normal in caliber. Mild atherosclerotic plaque of the thoracic aorta. Left anterior descending coronary artery stent. Tortuous descending thoracic aorta. Mediastinum/Nodes: Small hiatal hernia. No enlarged mediastinal, hilar, or axillary lymph nodes. Thyroid gland, trachea, and esophagus demonstrate no significant findings. Lungs/Pleura: No focal consolidation. No pulmonary nodule. No pulmonary mass. No pleural effusion. No pneumothorax. Upper Abdomen: No acute abnormality. Musculoskeletal: No chest wall abnormality.  Bilateral breast implants. No suspicious lytic or blastic osseous lesions. No acute displaced fracture. Multilevel degenerative changes of the spine. Chronic T9 vertebral body height loss. Review of the MIP images confirms the above findings. IMPRESSION: 1. Right upper lobe segmental and subsegmental pulmonary emboli. No pulmonary infarction or right heart strain. 2.  Small hiatal hernia. These results were called by telephone at the time of interpretation on 08/17/2023 at 11:27 pm to provider Thurmond Hildebran , who verbally acknowledged these results. Electronically Signed   By: Tish Frederickson M.D.   On: 08/17/2023 23:30   CARDIAC CATHETERIZATION  Result Date: 08/17/2023 Conclusions: Severe single-vessel coronary artery disease with acute plaque rupture in the proximal LAD leading to 70% stenosis in the proximal LAD and occlusion of the apical LAD from  embolized thrombus.  No angiographically significant CAD noted in the LCx or RCA. Preserved left ventricular systolic function (LVEF 55-65%) with apical akinesis. Normal left ventricular filling pressure (LVEDP 10 mmHg). Successful OCT-guided PCI to the proximal LAD using Onyx Frontier 4.0 x 15 mm drug-eluting stent with 0% residual stenosis and TIMI-2 flow from improving microvascular dysfunction. Recommendations: Admit to ICU/stepdown for post-STEMI care. Continue tirofiban infusion for 6 hours. Dual antiplatelet therapy with aspirin and prasugrel for at least 12 months. Aggressive secondary prevention of coronary artery disease, including high-intensity statin therapy. Gentle post-catheterization hydration. Yvonne Kendall, MD [facility]   Data Reviewed: Relevant notes from primary care and specialist visits, past discharge summaries as available in EHR, including Care Everywhere. Prior diagnostic testing as pertinent to current admission diagnoses Updated medications and problem lists for reconciliation ED course, including vitals, labs, imaging, treatment and response to treatment Triage notes, nursing and pharmacy notes and ED provider's notes Notable results as noted in HPI  Assessment and Plan: * STEMI involving left anterior descending coronary artery (HCC) .Patient with substernal chest pain that came on acutely not improving with NTG.  -Initial HS troponin mildly high : Cardiac Panel (last 3 results) Recent Labs    08/17/23 1800  TROPONINIHS 71*      -EKG with apparent anterolateral ST depression of uncertain duration, no apparent STEMI. -TIMI risk score is 4; which predicts a 14 days risk of death, recurrent MI, or urgent revascularization of 19.9%.  -Will plan to observe in progressive care unit at [facility] on telemetry to further evaluate for ACS.  -Will admit since the patient has positive troponins and/or an abnormal EKG with angina necessitating acute intervention. -Repeat EKG  in AM -Risk factor stratification with HgbA1c and lipid ,TSH,FT4. -Cardiology Dr.END  -Start ASA 81 mg daily,  losartan, Lovenox for DVT,Prasugrel per cardiology and additional antiplatelet therapy.    Acute pulmonary embolism (HCC) Pt positive for RUL segmental and subsegmental PE with no noted right heart strain. LE venous doppler pending as pt is high risk we did evaluate her for both. Pt to be started on heparin gtt and AM team along with cardiology to come up with plan for antiplatelet and anticoagulation plan as pt needs both.  Currently pt to start heparin once Aggrastat infusion completes about 4:30 per pharmacy nathan .  AKI (acute kidney injury) [facility]) Lab Results  Component Value Date   CREATININE 1.23 (H) 08/17/2023   CREATININE 0.97 01/23/2022   CREATININE 0.71 12/25/2021  Avoid ace or arb if possible. We will start low rate MIVF at 50 cc/hour.    Essential hypertension Vitals:   08/17/23 1759 08/17/23 2000  BP: 139/88 126/76  Patient started on losartan, continue as needed hydralazine and labetalol.    DVT prophylaxis:  Heparin GTT.  Consults:  Cardiology: Dr.END.   Advance Care Planning:    Code Status: Full Code   Family Communication:  None   Disposition Plan:  Home   Severity of Illness: The appropriate patient status for this patient is INPATIENT. Inpatient status is judged to be reasonable and necessary in order to provide the required intensity of service to ensure the patient's safety. The patient's presenting symptoms, physical exam findings, and initial radiographic and laboratory data in the context of their chronic comorbidities is felt to place them at high risk for further clinical deterioration. Furthermore, it is not anticipated that the patient will be medically stable for discharge from the hospital within 2 midnights of admission.   * I certify that at the point of admission it is my clinical judgment that the patient will require inpatient  hospital care spanning beyond 2 midnights from the point of admission due to high intensity of service, high risk for further deterioration and high frequency of surveillance required.*  Author: Gertha Calkin, MD 08/18/2023 2:10 AM  For on call review [email or web address].  Orders Placed This Encounter  Procedures   Critical Care    This order was created via procedure documentation    Standing Status:   Standing    Number of Occurrences:   1   MRSA Next Gen by PCR, Nasal    Standing Status:   Standing    Number of Occurrences:   1   US Venous Img Lower Bilateral (DVT)    Standing Status:   Standing    Number of Occurrences:   1    Order Specific Question:   Symptom/Reason for Exam    Answer:   Edema leg [[id number]]   CT Angio Chest Pulmonary Embolism (PE) W or WO Contrast    Standing Status:   Standing    Number of Occurrences:   1    Order Specific Question:   Does the patient have a contrast media/X-ray dye allergy?    Answer:   No    Order Specific Question:   If indicated for the ordered procedure, I authorize the administration of contrast media per Radiology protocol    Answer:   Yes   Basic metabolic panel    Standing Status:   Standing    Number of Occurrences:   1   CBC    Standing Status:   Standing    Number of Occurrences:   1   Hemoglobin A1c    Standing Status:   Standing  Number of Occurrences:   1   Protime-INR    Standing Status:   Standing    Number of Occurrences:   1   APTT    Standing Status:   Standing    Number of Occurrences:   1   Lipid panel    Standing Status:   Standing    Number of Occurrences:   1   Hepatic function panel    Standing Status:   Standing    Number of Occurrences:   1   Basic metabolic panel    Standing Status:   Standing    Number of Occurrences:   1   CBC    Standing Status:   Standing    Number of Occurrences:   1   Lipoprotein A (LPA)    Standing Status:   Standing    Number of Occurrences:   1   Creatinine, serum     Baseline for enoxaparin therapy IF NOT ALREADY DRAWN.    Standing Status:   Standing    Number of Occurrences:   1   Glucose, capillary    Standing Status:   Standing    Number of Occurrences:   1   APTT    Standing Status:   Standing    Number of Occurrences:   1   AMB Referral to Cardiac Rehabilitation - Phase II    Referral Priority:   Routine    Referral Type:   Consultation    Number of Visits Requested:   1   Diet Heart Room service appropriate? Yes; Fluid consistency: Thin    Standing Status:   Standing    Number of Occurrences:   1    Order Specific Question:   Room service appropriate?    Answer:   Yes    Order Specific Question:   Fluid consistency:    Answer:   Thin   Document Height and Actual Weight    Use scales to weigh patient, not stated or estimated weight.    Standing Status:   Standing    Number of Occurrences:   1   ED Cardiac monitoring    Standing Status:   Standing    Number of Occurrences:   1   Pulse checks    Standing Status:   Standing    Number of Occurrences:   1   Call Code STEMI by calling [facility] at [phone]    Standing Status:   Standing    Number of Occurrences:   1   Document Actual / Estimated Weight    Standing Status:   Standing    Number of Occurrences:   1   Remove all clothing, place in gown    Standing Status:   Standing    Number of Occurrences:   1   Apply radiolucent defibrillator pads    Standing Status:   Standing    Number of Occurrences:   1   HOLD NTG SL if Patient taking Cialis, Viagra or Levitra Hold NTG SL if patient is taking Cialis, Viagra, Levitra, or Revatio    Hold NTG SL if patient is taking Cialis, Viagra, Levitra, or Revatio    Standing Status:   Standing    Number of Occurrences:   1   HOLD Aspirin if Hold Aspirin if given by EMS    Hold Aspirin if given by EMS    Standing Status:   Standing    Number of Occurrences:   1  Initiate Carrier Fluid Protocol    Standing Status:   Standing    Number of  Occurrences:   1   Vital signs    Standing Status:   Standing    Number of Occurrences:   1   Cardiac monitoring    Standing Status:   Standing    Number of Occurrences:   1   Apply Cardiac or Vascular Catheterization and/or Intervention Care Plan    Standing Status:   Standing    Number of Occurrences:   1   RN may order Cardiology PRN Orders utilizing "Cardiology PRN medications" (through manage orders) for the following patient needs:    indigestion (Maalox)), cough (Robitussin DM), constipation (MOM), diarrhea (Imodium), hemorrhoids (Tucks), or minor skin irritation (Hydrocortisone Cream)    Standing Status:   Standing    Number of Occurrences:   [phone]   Refer to Clinical Skills - Radial Artery Compression Device Hemostasis    Standing Status:   Standing    Number of Occurrences:   1   Perform Reverse Barbeau at any handoff, and hourly until band removed    Standing Status:   Standing    Number of Occurrences:   1   Refer to Sidebar Report How to perform the Reverse Tora Perches If you are unable to obtain an A, B, or C waveform without the patient bleeding, continue with the deflation orders, and repeat the Reverse Barbeau at the next 15 minute deflation. If you ...    How to perform the Reverse Tora Perches  If you are unable to obtain an A, B, or C waveform without the patient bleeding, continue with the deflation orders, and repeat the Reverse Barbeau at the next 15 minute deflation. If you are still unable to obtain an A, B, or C waveform without the patient re-bleeding, notify the performing provider, as this indicates you are not able to achieve patent hemostasis.    Standing Status:   Standing    Number of Occurrences:   1   Pre-band removal: Place O2 sat probe with Pleth waveform on the thumb of the affected extremity. Assess and document site, pain, BP, HR, neurovascular checks, and Pleth waveform every 15 minutes until the band is removed    Standing Status:   Standing    Number of  Occurrences:   1   If bleeding occurs during band deflation, inject as little air as possible to achieve hemostasis; do not exceed the radial band bladder maximum air amount. Perform Reverse Barbeau to ensure patent hemostasis is maintained; wait 15 minutes before resuming band deflation.    Standing Status:   Standing    Number of Occurrences:   1   Post-band removal: Assess site, pain, BP, HR, neurovascular checks, and Pleth waveform every 30 minutes x 2, or until the patient is discharged; whichever comes first    Standing Status:   Standing    Number of Occurrences:   1   If bleeding occurs after band is removed, apply manual pressure for 20 minutes and notify MD.    Standing Status:   Standing    Number of Occurrences:   1   After the radial band is removed, prior to discharge, assess for radial artery patency by applying occlusive pressure to both radial and ulnar artery of accessed hand until you lose pleth waveform, then release pressure on radial artery only;    if you do not get an A, B, or C waveform, notify the performing  provider as this could indicate the patient may have radial artery occlusion    Standing Status:   Standing    Number of Occurrences:   1   Refer to Sidebar Report How to Assess for Radial Artery Patency    How to Assess for Radial Artery Patency    Standing Status:   Standing    Number of Occurrences:   1   Care order/instruction: Instruct patient to leave transparent dressing in place for 24 hours    Instruct patient to leave transparent dressing in place for 24 hours    Standing Status:   Standing    Number of Occurrences:   1   When applying pressure for bleeding and/or hematoma management, monitor pulse oximetry waveform via sensor on thumb to ensure perfusion is adequate while pressure is being applied.    Standing Status:   Standing    Number of Occurrences:   1   If a hematoma develops at puncture site, forearm, or upper arm, immediately apply manual  pressure and notify the procedural provider immediately as further intervention/surgery may be necessary.    Standing Status:   Standing    Number of Occurrences:   1   If forearm hematoma develops, apply manual BP cuff to hematoma site. Inflate the manual BP cuff to below the patient's systolic pressure and maintain for 15 minutes. After 15 minutes, deflate cuff pressure 3-49mm Hg every 2-3 minutes until deflated.    Standing Status:   Standing    Number of Occurrences:   1   If upper arm hematoma develops, apply manual BP cuff proximal to the hematoma, Inflate to below the patient's systolic pressure and maintain for 15 minutes. After 15 minutes, deflate cuff pressure 3-4mm Hg every 2-3 minutes until deflated.    Standing Status:   Standing    Number of Occurrences:   1   Reassess limb; may reapply and repeat manual BP cuff procedure if needed, ensure provider is notified that hematoma is not under control.    Standing Status:   Standing    Number of Occurrences:   1   Do not apply ice or wrap arm with "Ace" or "Coban" type wrap.    Standing Status:   Standing    Number of Occurrences:   1   Up in chair    Standing Status:   Standing    Number of Occurrences:   1   Bathroom privileges    First time with assistance, progress activity as tolerated    Standing Status:   Standing    Number of Occurrences:   1   Elevate extremity    Elevate wrist at or above heart level while at rest x 24 hours    Standing Status:   Standing    Number of Occurrences:   1   Initiate Oral Care Protocol    Standing Status:   Standing    Number of Occurrences:   1   Initiate Carrier Fluid Protocol    Standing Status:   Standing    Number of Occurrences:   1   Begin band deflation 30 minutes after band applied by withdrawing 1 mL of air. If no bleeding is noted, at 15 minute intervals, remove 3 mL of air until all the air is removed from the compression band. Observe site for 2 minutes after each  deflation to ensure patient is not going to re-bleed.    Remove band 1 hour after  all air is removed. Ensure site is clean and apply sterile, transparent dressing    Standing Status:   Standing    Number of Occurrences:   1   No ancillary consults needed at this time    Standing Status:   Standing    Number of Occurrences:   1   Full code    Standing Status:   Standing    Number of Occurrences:   1    Order Specific Question:   By:    Answer:   Consent: discussion documented in EHR   tirofiban (AGGRASTAT) per pharmacy consult    Standing Status:   Standing    Number of Occurrences:   1    Order Specific Question:   Post PCI, continue tirofiban (AGGRASTAT) for (time in hours):    Answer:   6   heparin per pharmacy consult    Standing Status:   Standing    Number of Occurrences:   1    Order Specific Question:   Indication:    Answer:   Pulmonary Embolism    Order Specific Question:   Comment:    Answer:   RUL   Oxygen therapy Mode or (Route): Nasal cannula; Liters Per Minute: 2; Keep 02 saturation: >90    Standing Status:   Standing    Number of Occurrences:   20    Order Specific Question:   Mode or (Route)    Answer:   Nasal cannula    Order Specific Question:   Liters Per Minute    Answer:   2    Order Specific Question:   Keep 02 saturation    Answer:   >90   I-Stat CG4 Lactic Acid, ED    Standing Status:   Standing    Number of Occurrences:   1   POCT Activated clotting time    Standing Status:   Standing    Number of Occurrences:   1   POCT Activated clotting time    Standing Status:   Standing    Number of Occurrences:   1   POCT Activated clotting time    Standing Status:   Standing    Number of Occurrences:   1   EKG 12-Lead    Standing Status:   Standing    Number of Occurrences:   1   ED EKG    Standing Status:   Standing    Number of Occurrences:   1    Order Specific Question:   Reason for Exam    Answer:   Chest Pain   EKG 12-Lead    Standing Status:    Standing    Number of Occurrences:   1   EKG 12-Lead immediately post procedure    Standing Status:   Standing    Number of Occurrences:   1    Order Specific Question:   Notes    Answer:   Post PCI   EKG 12-Lead    Standing Status:   Standing    Number of Occurrences:   1    Order Specific Question:   Notes    Answer:   Post PCI   ECHOCARDIOGRAM COMPLETE    Standing Status:   Standing    Number of Occurrences:   1    Order Specific Question:   Perflutren DEFINITY (image enhancing agent) should be administered unless hypersensitivity or allergy exist    Answer:   Administer Perflutren    Order Specific  Question:   Will [facility] be the location of this test?    Answer:   Yes    Order Specific Question:   Please indicate who you request to read the nuc med / echo results.    Answer:   [facility]    Order Specific Question:   Reason for exam-Echo    Answer:   Acute myocardial infarction, unspecified I21.9   Saline lock IV (Avoid right wrist)    Standing Status:   Standing    Number of Occurrences:   1   Admit to Inpatient (patient's expected length of stay will be greater than 2 midnights or inpatient only procedure)    Standing Status:   Standing    Number of Occurrences:   1    Order Specific Question:   Hospital Area    Answer:   [facility] [[id number]]    Order Specific Question:   Level of Care    Answer:   Stepdown [14]    Order Specific Question:   Covid Evaluation    Answer:   Asymptomatic - no recent exposure (last 10 days) testing not required    Order Specific Question:   Diagnosis    Answer:   STEMI involving left anterior descending coronary artery [facility]) [[id number]]    Order Specific Question:   Admitting Physician    Answer:   END, CHRISTOPHER [[id number]]    Order Specific Question:   Attending Physician    Answer:   END, CHRISTOPHER [[id number]]    Order Specific Question:   Certification:    Answer:   I certify this patient will need  inpatient services for at least 2 midnights    Order Specific Question:   Expected Medical Readiness    Answer:   08/19/2023   Admit to Inpatient (patient's expected length of stay will be greater than 2 midnights or inpatient only procedure)    Standing Status:   Standing    Number of Occurrences:   1    Order Specific Question:   Hospital Area    Answer:   [facility] [[id number]]    Order Specific Question:   Level of Care    Answer:   Stepdown [14]    Order Specific Question:   Covid Evaluation    Answer:   Asymptomatic - no recent exposure (last 10 days) testing not required    Order Specific Question:   Diagnosis    Answer:   STEMI involving left anterior descending coronary artery [facility]) [[id number]]    Order Specific Question:   Admitting Physician    Answer:   Darrold Junker    Order Specific Question:   Attending Physician    Answer:   Darrold Junker    Order Specific Question:   Certification:    Answer:   I certify this patient will need inpatient services for at least 2 midnights    Order Specific Question:   Expected Medical Readiness    Answer:   08/19/2023

## 2023-08-17 NOTE — ED Triage Notes (Signed)
Pt brought in via EMS from home. Pt had chest pain, nausea, SOB. Chest pain radiating down both arms  EMS gave 324mg  aspirin, 1 spray of nitroglycerin, and fentanyl

## 2023-08-17 NOTE — Hospital Course (Addendum)
HPI: Alicia Moyer is a 62 y.o. female with a history of arthritis, repeated right hip surgeries and chronic pain who presented to the ED on 08/17/2023 with abrupt central chest pain radiating to both arms, arriving as code STEMI   Hospital course / significant events:  confirmed to have severe LAD disease with acute proximal plaque rupture and apical embolized thrombus for which proximal LAD DES was placed. CTA chest also demonstrated RUL pulmonary embolism. She remains in ICU on heparin infusion, DAPT without hypoxemia, having clinically stabilized.  11/13 significant hypotension, levophed initiated, w/u thus far nonrevelaing likely medication effect, echo done and pending read   Consultants:  Cardiology   Procedures/Surgeries: 11/11: cardaic cath w/ PCI to LAD - Dr End       ASSESSMENT & PLAN:   STEMI: s/I LAD PCI 11/11 by Dr. Okey Dupre.  CAD No symptoms of angina or cardiac decompensation  With incidentally noted PE, she was transitioned from Effient to Plavix with a 600 mg loading dose on 11/12, followed by Plavix 75 mg daily thereafter to start on 11/13 triple therapy with ASA 81 mg daily, Plavix 75 mg daily, and Eliquis (PE dosing of 10 mg bid x 7 days then 5 mg bid thereafter) for 1 month.  At that time, ASA should stopped and she should remain on Eliquis and Plavix.  Once she has completed therapy of Eliquis, ASA should be resumed with continuation of Plavix to complete 12 months of DAPT Add Protonix 20 mg given triple therapy  Echo pending read Lipitor 40 mg, LDL 53 this admission, LP(a) pending Coreg 3.125 mg bid - holding parameters d/t low BP Cardiac rehab Aggressive risk factor modification and secondary prevention  Post cath instructions  Hypotension Concern for opiate / beta blocker / combination medication effect, Ddx would also include bleeding (no signs, no severe Hgb drop on CBC), low cardiac output heart failure, new MI Recheck STAT CBC - mild Hgb drop more c/w  hemodilution, no s/s hemorrhage, no pain  Troponins down-trending  AKI concern for hypotensive / prerenal Cr 0.8 --> 1.10 Lactic acid normal  EKG obtained showing nonspecific ST elevation in lead III, T wave inversions 2 3 aVF, anterolateral leads V4 5 6 No significant change compared to EKG performed yesterday Echo read pending Norepinephrine infusion initiated  Holding parameters on coreg D/c or reduced pain medications - d/c tramadol, reduced dose/frequency oxycodone, reduced gabapentin, stopped tizanidine  Cardiology also following - see Dr Windell Hummingbird note  Continue norepi and close VS monitoring  Repeat CBC for trend - no symptoms bleeding but would have low threshold for CT chest/abd/pelv if Hgb low and BP not improving  Remain in stepdown  Critical care consult   Chronic HFpEF, ICM:  - Echo pending, though LVEF wnl and filling pressure appeared ok during cath.  Continue ARB.  No indication for diuresis at this time.    RUL pulmonary embolism:  No evidence of R heart strain by CT or by exam.  No DVT on LE venous U/S.  Echocardiogram pending read.  Eliquis 10 mg bid x 7 days then 5 mg bid thereafter for 3-6 months  Query unprovoked PE, may benefit from hematology evaluation as an outpatient for hypercoagulable work up  Monitor CBC   NSVT: No further episodes Asymptomatic Brief runs Coreg as above Potassium at goal Check magnesium    HTN: Blood pressure mildly elevated Now on Coreg as above  AKI: SCr 1.23 on admission from baseline suspected to be normal (  0.7-0.9 based on remote values). SCr has improved.  Avoid nephrotoxins, renally dose medications.  Monitor BMP     Overweight based on BMI: Body mass index is 26.64 kg/m.  Underweight - under 18.5  normal weight - 18.5 to 24.9 overweight - 25 to 29.9 obese - 30 or more   DVT prophylaxis: eliquis IV fluids: no continuous IV fluids, s/p boluses, now on norepi in setting of IV fluids shortage Nutrition:  cardaic diet  Central lines / invasive devices: none  Code Status: FULL CODE ACP documentation reviewed: 08/19/23 and none on file in [facility]  TOC needs: TBD Barriers to dispo / significant pending items: hypotensive

## 2023-08-17 NOTE — Consult Note (Signed)
Cardiology Consultation   Patient ID: [name] MRN: [id number]; DOB: [birth year]  Admit date: 08/17/2023 Date of Consult: 08/17/2023  PCP:  Marina Goodell, MD    HeartCare Providers Cardiologist:  New - Glyn Zendejas   Patient Profile:   Alicia Moyer is a 62 y.o. female with a hx of hypertension,, HFpEF, and chronic pain, who is being seen 08/17/2023 for the evaluation of chest pain and abnormal EKG at the request of Dr. Anner Crete.  History of Present Illness:   Alicia Moyer was in her usual state of health until earlier today.  She felt somewhat nauseated around lunchtime.  She was taking a bath in the mid afternoon when she suddenly developed substernal chest pain about an hour prior to arrival.  Pain radiated to both arms and was accompanied by shortness of breath and diaphoresis.  She called out for her husband, who did not respond.  She therefore called 911.  When EMS arrived, they found that she had inferior ST segment elevation prompting activation of the STEMI team.  Upon arrival at [facility], she continued to complain of 10/10 chest pain despite having received aspirin, nitroglycerin, and fentanyl by EMS.  EKG still showed subtle inferior ST segment elevation.  She was therefore taken to the Cath Lab where she was noted to have acute plaque rupture with nonocclusive thrombus in the proximal LAD.  Embolized thrombus with total occlusion of the apical LAD was also seen.  She underwent primary PCI to the ruptured plaque in the proximal LAD.  Dottering of the apical LAD lesion and aggressive antiplatelet therapy restored flow to the apical LAD.  She is currently chest pain-free.   Past Medical History:  Diagnosis Date   Arthritis    CHF (congestive heart failure) (HCC)    Chronic pain    Hypertension    Scoliosis     Past Surgical History:  Procedure Laterality Date   ABDOMINAL HYSTERECTOMY  2000   DILATION AND CURETTAGE OF UTERUS     Hip replacement right     x6      Inpatient Medications: Scheduled Meds:  [START ON 08/18/2023] aspirin  81 mg Oral Daily   [START ON 08/18/2023] Chlorhexidine Gluconate Cloth  6 each Topical Q0600   fentaNYL (SUBLIMAZE) injection  50 mcg Intravenous Once   [START ON 08/18/2023] prasugrel  10 mg Oral Daily   Continuous Infusions:  PRN Meds:   Allergies:    Allergies  Allergen Reactions   Clindamycin/Lincomycin Anaphylaxis    Rash,swelling of face and neck   Zolpidem Other (See Comments)    Social History:   Social History   Tobacco Use   Smoking status: Never  Vaping Use   Vaping status: Never Used  Substance Use Topics   Alcohol use: Never   Drug use: Never      Family History:   Family History  Problem Relation Age of Onset   Aneurysm Other         grandmother     ROS:  Review of Systems  Unable to perform ROS: Acuity of condition     Physical Exam/Data:   Vitals:   08/17/23 1803 08/17/23 1815 08/17/23 1945 08/17/23 2000  BP:    126/76  Pulse:    92  Resp:    20  Temp:   98.3 F (36.8 C)   TempSrc:   Oral   SpO2:  100%  100%  Weight: 70.4 kg      No intake  or output data in the 24 hours ending 08/17/23 2007    08/17/2023    6:03 PM 05/29/2022   12:09 PM 05/02/2022   11:52 AM  Last 3 Weights  Weight (lbs) 155 lb 3.2 oz 151 lb 163 lb 2.3 oz  Weight (kg) 70.398 kg 68.493 kg 74 kg     Body mass index is 26.64 kg/m.  General: Uncomfortable appearing woman, lying on stretcher in ED prior to catheterization. Neck: Unable to assess JVP due to patient positioning Vascular: No carotid bruits; 2+ right radial pulse. Cardiac: Regular rate and rhythm without murmurs, rubs, or gallops. Lungs: Mildly breath sounds throughout without wheezes or crackles. Abd: soft, nontender, no hepatomegaly  Ext: Trace ankle edema bilaterally. Musculoskeletal:  No deformities, BUE and BLE strength normal and equal Skin: warm and dry  Neuro:  CNs 2-12 intact, no focal abnormalities noted Psych:  Anxious appearing, moaning frequently during examination in the ED.  EKG:  The EKG was personally reviewed and demonstrates: Normal sinus rhythm with up to 1 mm ST segment elevation in the inferior leads. Telemetry:  Telemetry was personally reviewed and demonstrates: Sinus rhythm  Relevant CV Studies: See cardiac catheterization report below.  Laboratory Data:  High Sensitivity Troponin:   Recent Labs  Lab 08/17/23 1800  TROPONINIHS 71*     Chemistry Recent Labs  Lab 08/17/23 1800  NA 135  K 4.6  CL 105  CO2 21*  GLUCOSE 145*  BUN 44*  CREATININE 1.23*  CALCIUM 8.9  GFRNONAA 50*  ANIONGAP 9    No results for input(s): "PROT", "ALBUMIN", "AST", "ALT", "ALKPHOS", "BILITOT" in the last 168 hours. Lipids No results for input(s): "CHOL", "TRIG", "HDL", "LABVLDL", "LDLCALC", "CHOLHDL" in the last 168 hours.  Hematology Recent Labs  Lab 08/17/23 1800  WBC 14.7*  RBC 4.67  HGB 14.0  HCT 42.2  MCV 90.4  MCH 30.0  MCHC 33.2  RDW 13.2  PLT 271   Thyroid No results for input(s): "TSH", "FREET4" in the last 168 hours.  BNPNo results for input(s): "BNP", "PROBNP" in the last 168 hours.  DDimer No results for input(s): "DDIMER" in the last 168 hours.   Radiology/Studies:  CARDIAC CATHETERIZATION  Result Date: 08/17/2023 Conclusions: Severe single-vessel coronary artery disease with acute plaque rupture in the proximal LAD leading to 70% stenosis in the proximal LAD and occlusion of the apical LAD from embolized thrombus.  No angiographically significant CAD noted in the LCx or RCA. Preserved left ventricular systolic function (LVEF 55-65%) with apical akinesis. Normal left ventricular filling pressure (LVEDP 10 mmHg). Successful OCT-guided PCI to the proximal LAD using Onyx Frontier 4.0 x 15 mm drug-eluting stent with 0% residual stenosis and TIMI-2 flow from improving microvascular dysfunction. Recommendations: Admit to ICU/stepdown for post-STEMI care. Continue tirofiban  infusion for 6 hours. Dual antiplatelet therapy with aspirin and prasugrel for at least 12 months. Aggressive secondary prevention of coronary artery disease, including high-intensity statin therapy. Gentle post-catheterization hydration. Yvonne Kendall, MD [facility]    Assessment and Plan:   STEMI: Patient presents with acute onset of chest pain about an hour prior to arrival, found to have subtle inferior ST segment elevation on EKG.  She was taken for emergent cardiac catheterization that revealed acute plaque rupture with thrombus formation in the proximal LAD, embolizing to the apical LAD and accounting for her inferior ST segment elevation.  She underwent primary PCI to the ruptured plaque in the proximal LAD, with flow being reestablished in the apical LAD  with wire/balloon Dottering of the lesion and aggressive antiplatelet therapy.  She is currently chest pain-free. -Admit to stepdown.  Appreciate assistance of the hospitalist service with management during her admission. -Continue tirofiban infusion for 6 hours. -Dual antiplatelet therapy with aspirin and prasugrel for at least 12 months. -Atorvastatin 80 mg daily. -Obtain echocardiogram. -Follow-up lipid panel and hemoglobin A1c.  Chronic HFpEF and ischemic cardiomyopathy: Patient with history of HFpEF, echo in 12/23 documenting normal LVEF and grade 2 diastolic dysfunction.  Left ventriculogram at conclusion of catheterization today showed apical akinesis with otherwise preserved LVEF.  LVEDP was normal. -Hold furosemide for now.  Gentle postcatheterization hydration given contrast exposure and mildly reduced GFR. -Follow-up echocardiogram. -Resume losartan as renal function allows.  Hypertension: Blood pressure initially elevated but improved at the Gunter Conde of catheterization. -Continue losartan 50 mg daily as blood pressure/renal function allow.  Chronic pain: -Resume home doses of oxycodone, gabapentin, and tenacity  in. -Extended release tramadol not available to order.  Appreciate assistance of internal medicine team with finding alternative basal analgesia.  For questions or updates, please contact [facility] Please consult [email or web address] for contact info under [facility].  Signed, Yvonne Kendall, MD  08/17/2023 8:07 PM

## 2023-08-17 NOTE — Assessment & Plan Note (Signed)
Vitals:   08/17/23 1759 08/17/23 2000  BP: 139/88 126/76  Patient started on losartan, continue as needed hydralazine and labetalol.

## 2023-08-17 NOTE — Progress Notes (Signed)
   08/17/23 1900  Spiritual Encounters  Type of Visit Initial  Referral source Code page  Reason for visit Code  OnCall Visit Yes   Chaplain responded to Code STEMI at 1745. Patient being attended to by care team. No family present. Patient taken to Cath Lab and chaplain followed up. No family present. Chaplain services available on request.

## 2023-08-17 NOTE — Progress Notes (Signed)
PHARMACY - ANTICOAGULATION CONSULT NOTE  Pharmacy Consult for tirofiban Indication: chest pain/ACS  Allergies  Allergen Reactions   Clindamycin/Lincomycin Anaphylaxis    Rash,swelling of face and neck   Zolpidem Other (See Comments)    Patient Measurements: Height: 5\' 4"  (162.6 cm) Weight: 70.4 kg (155 lb 3.2 oz) IBW/kg (Calculated) : 54.7  Vital Signs: Temp: 98.3 F (36.8 C) (11/11 1945) Temp Source: Oral (11/11 1945) BP: 128/77 (11/11 2100) Pulse Rate: 70 (11/11 2100)  Labs: Recent Labs    08/17/23 1800  HGB 14.0  HCT 42.2  PLT 271  CREATININE 1.23*  TROPONINIHS 71*    Estimated Creatinine Clearance: 45.7 mL/min (A) (by C-G formula based on SCr of 1.23 mg/dL (H)).   Medical History: Past Medical History:  Diagnosis Date   Arthritis    CHF (congestive heart failure) (HCC)    Chronic pain    CSF abnormal 07/15/2014   Fibromyalgia 07/15/2014   Hypertension    Scoliosis    Assessment: 62 y/o female presenting with chest pain and taken for emergent cardiac catheterization which revealed acute plaque rupture with thrombus formation in the proximal LAD. Per cardiology, will continue tirofiban infusion for 6 hours. Pharmacy has been consulted to dose tirofiban infusion.  Goal of Therapy:  Monitor platelets by anticoagulation protocol: Yes   Plan:  Tirofiban bolus of 1760 mg given in cath lab at 1835 Start tirofiban infusion at 0.075 mcg/kg/min based on current renal function and weight and continue for 6 hours Monitor CBC and signs/symptoms of bleeding  Thank you for involving pharmacy in this patient's care.   Rockwell Alexandria, PharmD Clinical Pharmacist 08/17/2023 9:53 PM

## 2023-08-17 NOTE — Progress Notes (Signed)
This RN received Critical Labs troponin 2384. & APTT >200; Notified Dr.Patel. Will cont to monitor.  Willey Blade 08/17/2023

## 2023-08-17 NOTE — ED Notes (Signed)
Pt to Cath lab with Dr. Len Childs, RN, Abran Richard, RN on full cardiac monitor and zoll.

## 2023-08-17 NOTE — ED Provider Notes (Addendum)
[facility] Provider Note    Event Date/Time   First MD Initiated Contact with Patient 08/17/23 1805     (approximate)   History   Code STEMI   HPI Alicia Moyer is a 62 y.o. female with history of CHF, HTN, chronic pain presenting today as a code STEMI.  Patient reported had onset of chest tightness and pain substernally an hour before arrival here with radiation to bilateral arms.  She has felt short of breath, nauseous, and intermittently diaphoretic.  No prior symptoms similar to this.  Denies abdominal pain, vomiting, recent fevers, cough, or congestion.  Denies recent smoking history.  Reviewed recent chart notes.     Physical Exam   Triage Vital Signs: ED Triage Vitals  Encounter Vitals Group     BP 08/17/23 1759 139/88     Systolic BP Percentile --      Diastolic BP Percentile --      Pulse Rate 08/17/23 1759 64     Resp 08/17/23 1759 15     Temp 08/17/23 1759 (!) 97.5 F (36.4 C)     Temp Source 08/17/23 1759 Axillary     SpO2 08/17/23 1759 100 %     Weight 08/17/23 1803 155 lb 3.2 oz (70.4 kg)     Height --      Head Circumference --      Peak Moyer --      Pain Score 08/17/23 1759 10     Pain Loc --      Pain Education --      Exclude from Growth Chart --     Most recent vital signs: Vitals:   08/17/23 1759 08/17/23 1815  BP: 139/88   Pulse: 64   Resp: 15   Temp: (!) 97.5 F (36.4 C)   SpO2: 100% 100%   Physical Exam: I have reviewed the vital signs and nursing notes. General: Awake, alert, uncomfortable appearing Head:  Atraumatic, normocephalic.   ENT:  EOM intact, PERRL. Oral mucosa is pink and moist with no lesions. Neck: Neck is supple with full range of motion, No meningeal signs. Cardiovascular:  RRR, No murmurs. Peripheral pulses palpable and equal bilaterally. Respiratory:  Symmetrical chest wall expansion.  No rhonchi, rales, or wheezes.  Good air movement throughout.  No use of accessory muscles.    Musculoskeletal:  No cyanosis or edema. Moving extremities with full ROM Abdomen:  Soft, nontender, nondistended. Neuro:  GCS 15, moving all four extremities, interacting appropriately. Speech clear. Psych:  Calm, appropriate.   Skin:  Warm, dry, no rash.    ED Results / Procedures / Treatments   Labs (all labs ordered are listed, but only abnormal results are displayed) Labs Reviewed  CBC - Abnormal; Notable for the following components:      Result Value   WBC 14.7 (*)    All other components within normal limits  BASIC METABOLIC PANEL  HEMOGLOBIN A1C  PROTIME-INR  APTT  LIPID PANEL  I-STAT CG4 LACTIC ACID, ED  TROPONIN I (HIGH SENSITIVITY)     EKG My EKG interpretation: Rate of 66, normal sinus rhythm.  Questionable ST elevations present in lead II, III, and aVF.  No other associated reciprocal changes.  Repeat EKG equivocal.   RADIOLOGY    PROCEDURES:  Critical Care performed: Yes, see critical care procedure note(s)  .Critical Care  Performed by: Janith Lima, MD Authorized by: Janith Lima, MD   Critical care provider statement:  Critical care time (minutes):  30   Critical care was necessary to treat or prevent imminent or life-threatening deterioration of the following conditions:  Cardiac failure (STEMI)   Critical care was time spent personally by me on the following activities:  Development of treatment plan with patient or surrogate, discussions with consultants, evaluation of patient's response to treatment, examination of patient, ordering and review of laboratory studies, ordering and review of radiographic studies, ordering and performing treatments and interventions, pulse oximetry, re-evaluation of patient's condition and review of old charts    MEDICATIONS ORDERED IN ED: Medications  fentaNYL (SUBLIMAZE) injection 50 mcg ( Intravenous Automatically Held 08/17/23 1815)  Heparin (Porcine) in NaCl 2000-0.9 UNIT/L-% SOLN (1,000 mLs  Given  08/17/23 1816)  midazolam (VERSED) injection (1 mg Intravenous Given 08/17/23 1821)  fentaNYL (SUBLIMAZE) injection (25 mcg Intravenous Given 08/17/23 1821)  lidocaine (PF) (XYLOCAINE) 1 % injection (2 mLs  Given 08/17/23 1822)  heparin injection 4,000 Units ( Intravenous MAR Hold 08/17/23 1812)     IMPRESSION / MDM / ASSESSMENT AND PLAN / ED COURSE  I reviewed the triage vital signs and the nursing notes.                              Differential diagnosis includes, but is not limited to, STEMI, ACS, pneumonia  Patient's presentation is most consistent with acute presentation with potential threat to life or bodily function.  Patient is a 62 year old female presenting today as a code STEMI from the field.  EKGs from the field as well as on arrival here reviewed with cardiology, Dr. Okey Dupre.  Initial EKGs concerning for ST elevation with repeat once more equivocal.  Given patient's symptoms along with story and her medical history, cardiology did feel she needed to go to the Cath Lab for intervention.  Patient was taken emergently to the Cath Lab.  She was given heparin in the emergency department on transfer.  The patient is on the cardiac monitor to evaluate for evidence of arrhythmia and/or significant heart rate changes. Clinical Course as of 08/17/23 1823  Mon Aug 17, 2023  4098 Patient taken emergently to the cath lab [DW]    Clinical Course User Index [DW] Janith Lima, MD     FINAL CLINICAL IMPRESSION(S) / ED DIAGNOSES   Final diagnoses:  ST elevation myocardial infarction (STEMI), unspecified artery (HCC)     Rx / DC Orders   ED Discharge Orders     None        Note:  This document was prepared using Dragon voice recognition software and may include unintentional dictation errors.   Janith Lima, MD 08/17/23 Kristeen Mans    Janith Lima, MD 08/17/23 [phone]

## 2023-08-17 NOTE — Assessment & Plan Note (Signed)
Lab Results  Component Value Date   CREATININE 1.23 (H) 08/17/2023   CREATININE 0.97 01/23/2022   CREATININE 0.71 12/25/2021  Avoid ace or arb if possible. We will start low rate MIVF at 50 cc/hour.

## 2023-08-18 ENCOUNTER — Encounter: Payer: Self-pay | Admitting: Internal Medicine

## 2023-08-18 ENCOUNTER — Other Ambulatory Visit (HOSPITAL_COMMUNITY): Payer: Self-pay

## 2023-08-18 DIAGNOSIS — I2699 Other pulmonary embolism without acute cor pulmonale: Secondary | ICD-10-CM | POA: Diagnosis present

## 2023-08-18 DIAGNOSIS — I2102 ST elevation (STEMI) myocardial infarction involving left anterior descending coronary artery: Secondary | ICD-10-CM | POA: Diagnosis not present

## 2023-08-18 LAB — BASIC METABOLIC PANEL
Anion gap: 5 (ref 5–15)
BUN: 44 mg/dL — ABNORMAL HIGH (ref 8–23)
CO2: 24 mmol/L (ref 22–32)
Calcium: 8.5 mg/dL — ABNORMAL LOW (ref 8.9–10.3)
Chloride: 105 mmol/L (ref 98–111)
Creatinine, Ser: 0.98 mg/dL (ref 0.44–1.00)
GFR, Estimated: >60 mL/min (ref >60)
Glucose, Bld: 103 mg/dL — ABNORMAL HIGH (ref 70–99)
Potassium: 4.9 mmol/L (ref 3.5–5.1)
Sodium: 134 mmol/L — ABNORMAL LOW (ref 135–145)

## 2023-08-18 LAB — HEPATIC FUNCTION PANEL
ALT: 25 U/L (ref 0–44)
AST: 39 U/L (ref 15–41)
Albumin: 3.1 g/dL — ABNORMAL LOW (ref 3.5–5.0)
Alkaline Phosphatase: 72 U/L (ref 38–126)
Bilirubin, Direct: <0.1 mg/dL (ref 0.0–0.2)
Total Bilirubin: 0.6 mg/dL (ref <1.2)
Total Protein: 6 g/dL — ABNORMAL LOW (ref 6.5–8.1)

## 2023-08-18 LAB — TROPONIN I (HIGH SENSITIVITY): Troponin I (High Sensitivity): 10831 ng/L (ref <18)

## 2023-08-18 LAB — HEPARIN LEVEL (UNFRACTIONATED)
Heparin Unfractionated: 0.75 [IU]/mL — ABNORMAL HIGH (ref 0.30–0.70)
Heparin Unfractionated: 0.72 [IU]/mL — ABNORMAL HIGH (ref 0.30–0.70)

## 2023-08-18 LAB — CBC
HCT: 41.2 % (ref 36.0–46.0)
Hemoglobin: 13.9 g/dL (ref 12.0–15.0)
MCH: 29.7 pg (ref 26.0–34.0)
MCHC: 33.7 g/dL (ref 30.0–36.0)
MCV: 88 fL (ref 80.0–100.0)
Platelets: 271 10*3/uL (ref 150–400)
RBC: 4.68 MIL/uL (ref 3.87–5.11)
RDW: 13.1 % (ref 11.5–15.5)
WBC: 15.1 10*3/uL — ABNORMAL HIGH (ref 4.0–10.5)
nRBC: 0 % (ref 0.0–0.2)

## 2023-08-18 LAB — HEMOGLOBIN A1C
Hgb A1c MFr Bld: 5.4 % (ref 4.8–5.6)
Mean Plasma Glucose: 108.28 mg/dL

## 2023-08-18 MED ORDER — ATORVASTATIN CALCIUM 20 MG PO TABS: 80.0000 mg | ORAL_TABLET | Freq: Every day | ORAL | Status: DC

## 2023-08-18 MED ORDER — CARVEDILOL 3.125 MG PO TABS
3.1250 mg | ORAL_TABLET | Freq: Two times a day (BID) | ORAL | Status: DC
  Administered 2023-08-18 – 2023-08-19 (×3): 3.125 mg via ORAL
  Filled 2023-08-18 (×3): qty 1

## 2023-08-18 MED ORDER — ORAL CARE MOUTH RINSE: 15.0000 mL | OROMUCOSAL | Status: DC | PRN

## 2023-08-18 MED ORDER — HEPARIN (PORCINE) 25000 UT/250ML-% IV SOLN
900.0000 [IU]/h | INTRAVENOUS | Status: DC
  Administered 2023-08-18: 1100 [IU]/h via INTRAVENOUS
  Administered 2023-08-19: 900 [IU]/h via INTRAVENOUS
  Filled 2023-08-18 (×2): qty 250

## 2023-08-18 MED ORDER — ATORVASTATIN CALCIUM 20 MG PO TABS
40.0000 mg | ORAL_TABLET | Freq: Every day | ORAL | Status: DC
  Administered 2023-08-19 – 2023-08-21 (×3): 40 mg via ORAL
  Filled 2023-08-18 (×3): qty 2

## 2023-08-18 MED ORDER — CLOPIDOGREL BISULFATE 300 MG PO TABS
600.0000 mg | ORAL_TABLET | Freq: Once | ORAL | Status: AC
  Administered 2023-08-18: 600 mg via ORAL
  Filled 2023-08-18: qty 2

## 2023-08-18 NOTE — TOC Benefit Eligibility Note (Signed)
 Patient Product/process development scientist completed.    The patient is insured through [facility]. Patient has Medicare and is not eligible for a copay card, but may be able to apply for patient assistance, if available.    Ran test claim for Eliquis 5 mg and the current 30 day co-pay is $47.00.   This test claim was processed through [facility]- copay amounts may vary at other pharmacies due to pharmacy/plan contracts, or as the patient moves through the different stages of their insurance plan.     Alicia Moyer, CPHT Pharmacy Technician III Certified Patient Advocate [facility] Patient Advocate Team Direct Number: [phone]  Fax: [phone]

## 2023-08-18 NOTE — Progress Notes (Signed)
Progress Note  Patient Name: Alicia Moyer Date of Encounter: 08/18/2023  Primary Cardiologist: Agbor-Etang  Subjective   No chest pain, dyspnea, palpitations, presyncope or syncope. Post cath, she underwent CTA chest (?, possible secondary to remote hip surgery in 2011) that showed right upper lobe segmental and subsegmental PE without pulmonary infarction or right heart strain. Lower extremity Duplex negative for DVT. On heparin gtt. Echo pending.   Inpatient Medications    Scheduled Meds:  aspirin  81 mg Oral Daily   atorvastatin  80 mg Oral Daily   carvedilol  3.125 mg Oral BID WC   Chlorhexidine Gluconate Cloth  6 each Topical Q0600   clopidogrel  600 mg Oral Once   gabapentin  1,200 mg Oral BID   sodium chloride flush  3 mL Intravenous Q12H   Continuous Infusions:  sodium chloride     heparin 1,100 Units/hr (08/18/23 0621)   PRN Meds: sodium chloride, acetaminophen, ondansetron (ZOFRAN) IV, mouth rinse, oxyCODONE, sodium chloride flush, tiZANidine, traMADol   Vital Signs    Vitals:   08/18/23 0725 08/18/23 0800 08/18/23 0900 08/18/23 0915  BP:  (!) 152/111  (!) 152/111  Pulse: 92 (!) 106 91 (!) 107  Resp: 15 (!) 23 11 (!) 30  Temp:      TempSrc:      SpO2: 96% 94% 94% 95%  Weight:      Height:        Intake/Output Summary (Last 24 hours) at 08/18/2023 0953 Last data filed at 08/18/2023 4696 Gross per 24 hour  Intake 820.64 ml  Output 750 ml  Net 70.64 ml   Filed Weights   08/17/23 1803  Weight: 70.4 kg    Telemetry    SR with 2 runs of NSVT lasting 4-5 beats, heart rates in the 90s bpm - Personally Reviewed  ECG    Pending - Personally Reviewed  Physical Exam   GEN: No acute distress.   Neck: No JVD. Cardiac: RRR, no murmurs, rubs, or gallops.  Right radial arteriotomy site without active bleeding, swelling, warmth, erythema, or TTP.  Radial pulse 2+ proximal and distal to the arteriotomy site.  Respiratory: Clear to auscultation  bilaterally.  GI: Soft, nontender, non-distended.   MS: No edema; No deformity. Neuro:  Alert and oriented x 3; Nonfocal.  Psych: Normal affect.  Labs    Chemistry Recent Labs  Lab 08/17/23 1800 08/17/23 2138 08/17/23 2317 08/18/23 0412  NA 135  --   --  134*  K 4.6  --   --  4.9  CL 105  --   --  105  CO2 21*  --   --  24  GLUCOSE 145*  --   --  103*  BUN 44*  --   --  44*  CREATININE 1.23* 0.92  --  0.98  CALCIUM 8.9  --   --  8.5*  PROT  --   --  6.0*  --   ALBUMIN  --   --  3.1*  --   AST  --   --  39  --   ALT  --   --  25  --   ALKPHOS  --   --  72  --   BILITOT  --   --  0.6  --   GFRNONAA 50* >60  --  >60  ANIONGAP 9  --   --  5     Hematology Recent Labs  Lab 08/17/23 1800 08/18/23 2952  WBC 14.7* 15.1*  RBC 4.67 4.68  HGB 14.0 13.9  HCT 42.2 41.2  MCV 90.4 88.0  MCH 30.0 29.7  MCHC 33.2 33.7  RDW 13.2 13.1  PLT 271 271    Cardiac EnzymesNo results for input(s): "TROPONINI" in the last 168 hours. No results for input(s): "TROPIPOC" in the last 168 hours.   BNPNo results for input(s): "BNP", "PROBNP" in the last 168 hours.   DDimer No results for input(s): "DDIMER" in the last 168 hours.   Radiology    US Venous Img Lower Bilateral (DVT)  Result Date: 08/18/2023 IMPRESSION: No evidence of deep venous thrombosis in either lower extremity. Electronically Signed   By: Aram Candela M.D.   On: 08/18/2023 04:44   CT Angio Chest Pulmonary Embolism (PE) W or WO Contrast  Result Date: 08/17/2023 IMPRESSION: 1. Right upper lobe segmental and subsegmental pulmonary emboli. No pulmonary infarction or right heart strain. 2.  Small hiatal hernia. These results were called by telephone at the time of interpretation on 08/17/2023 at 11:27 pm to provider EKTA PATEL , who verbally acknowledged these results. Electronically Signed   By: Tish Frederickson M.D.   On: 08/17/2023 23:30   Cardiac Studies   LHC 08/17/2023: Conclusions: Severe single-vessel  coronary artery disease with acute plaque rupture in the proximal LAD leading to 70% stenosis in the proximal LAD and occlusion of the apical LAD from embolized thrombus.  No angiographically significant CAD noted in the LCx or RCA. Preserved left ventricular systolic function (LVEF 55-65%) with apical akinesis. Normal left ventricular filling pressure (LVEDP 10 mmHg). Successful OCT-guided PCI to the proximal LAD using Onyx Frontier 4.0 x 15 mm drug-eluting stent with 0% residual stenosis and TIMI-2 flow from improving microvascular dysfunction.   Recommendations: Admit to ICU/stepdown for post-STEMI care. Continue tirofiban infusion for 6 hours. Dual antiplatelet therapy with aspirin and prasugrel for at least 12 months. Aggressive secondary prevention of coronary artery disease, including high-intensity statin therapy. Gentle post-catheterization hydration. __________  2D echo pending  Patient Profile     62 y.o. female with history of HFpEF, HTN, and chronic pain who was admitted with an anterior STEMI and incidentally found to subsequently have a right upper lobe segmental and subsegmental PE.  Assessment & Plan    1. Anterior STEMI: -No symptoms of angina or cardiac decompensation  -With incidentally noted PE, we will transition her from Effient to Plavix with a 600 mg loading dose at 1930 (24 hours after loading dose of Effient) followed by Plavix 75 mg daily thereafter to start on 11/13 -We will plan to maintain her on triple therapy with ASA 81 mg daily, Plavix 75 mg daily, and Eliquis (PE dosing of 10 mg bid x 7 days then 5 mg bid thereafter) for 1 week. At that time, ASA should stopped and she should remain on Eliquis and Plavix. Once she has completed therapy of Eliquis, ASA should be resumed with continuation of Plavix -Echo pending -Lipitor 80 mg, LDL 53 this admission -LP(a) pending -Coreg 3.125 mg bid -Cardiac rehab -Aggressive risk factor modification and secondary  prevention   2. PE: -Incidentally noted -No evidence of pulmonary infarction or right heart strain  -Echo pending -Continue heparin gtt for today with plans to transition to Eliquis 10 mg bid x 7 days then 5 mg bid thereafter for 3-6 months -Query unprovoked PE, would benefit from hematology evaluation as an outpatient for hypercoagulable work up  -Monitor CBC  3. NSVT: -Asymptomatic -Brief runs -Add  Coreg as above -Potassium at goal -Check magnesium   4. HTN: -Blood pressure mildly elevated -Add Coreg as above       For questions or updates, please contact [facility] Please consult [email or web address] for contact info under Cardiology/STEMI.    Signed, Eula Listen, PA-C [facility] Pager: [phone] 08/18/2023, 9:53 AM

## 2023-08-18 NOTE — Progress Notes (Signed)
ANTICOAGULATION CONSULT NOTE  Pharmacy Consult for heparin infusion Indication: pulmonary embolus  Allergies  Allergen Reactions   Clindamycin/Lincomycin Anaphylaxis    Rash,swelling of face and neck   Zolpidem Other (See Comments)   Patient Measurements: Height: 5\' 4"  (162.6 cm) Weight: 70.4 kg (155 lb 3.2 oz) IBW/kg (Calculated) : 54.7 Heparin Dosing Weight: 69 kg  Vital Signs: BP: 130/95 (11/12 1730) Pulse Rate: 80 (11/12 1730)  Labs: Recent Labs    08/17/23 1800 08/17/23 2138 08/17/23 2317 08/18/23 0412 08/18/23 0958 08/18/23 1738  HGB 14.0  --   --  13.9  --   --   HCT 42.2  --   --  41.2  --   --   PLT 271  --   --  271  --   --   APTT  --  >200* 75*  --   --   --   LABPROT  --  16.1*  --   --   --   --   INR  --  1.3*  --   --   --   --   HEPARINUNFRC  --   --   --   --  0.75* 0.72*  CREATININE 1.23* 0.92  --  0.98  --   --   TROPONINIHS 71* 2,384*  --  10,831*  --   --    Estimated Creatinine Clearance: 57.3 mL/min (by C-G formula based on SCr of 0.98 mg/dL).  Medical History: Past Medical History:  Diagnosis Date   Arthritis    CHF (congestive heart failure) (HCC)    Chronic pain    CSF abnormal 07/15/2014   Fibromyalgia 07/15/2014   Hypertension    Scoliosis    Assessment: Pt is a 62 yo female presenting post cardiac cath procedure, now with found with R upper lobe PE.  Goal of Therapy:  Heparin level 0.3-0.7 units/ml Monitor platelets by anticoagulation protocol: Yes  Labs:  1112 0958 HL 0.75; supratherapeutic  1112 1738 HL 0.72, supratherapeutic  Per lab, level drawn appropriately. No signs/symptoms of bleeding noted.   Plan:  Decrease heparin infusion to 900 units/hr  Check next HL 6 hours after rate adjustment  Monitor CBC daily while on heparin   Thank you for involving pharmacy in this patient's care.   Rockwell Alexandria, PharmD Clinical Pharmacist 08/18/2023 6:51 PM

## 2023-08-18 NOTE — Plan of Care (Signed)
  Problem: Education: Goal: Knowledge of General Education information will improve Description Including pain rating scale, medication(s)/side effects and non-pharmacologic comfort measures Outcome: Progressing   

## 2023-08-18 NOTE — Progress Notes (Signed)
ANTICOAGULATION CONSULT NOTE  Pharmacy Consult for heparin infusion Indication: pulmonary embolus  Allergies  Allergen Reactions   Clindamycin/Lincomycin Anaphylaxis    Rash,swelling of face and neck   Zolpidem Other (See Comments)    Patient Measurements: Height: 5\' 4"  (162.6 cm) Weight: 70.4 kg (155 lb 3.2 oz) IBW/kg (Calculated) : 54.7 Heparin Dosing Weight: 69 kg  Vital Signs: Temp: 98.3 F (36.8 C) (11/11 1945) Temp Source: Oral (11/11 1945) BP: 123/81 (11/12 0200) Pulse Rate: 84 (11/12 0200)  Labs: Recent Labs    08/17/23 1800 08/17/23 2138 08/17/23 2317  HGB 14.0  --   --   HCT 42.2  --   --   PLT 271  --   --   APTT  --  >200* 75*  LABPROT  --  16.1*  --   INR  --  1.3*  --   CREATININE 1.23* 0.92  --   TROPONINIHS 71* 2,384*  --     Estimated Creatinine Clearance: 61.1 mL/min (by C-G formula based on SCr of 0.92 mg/dL).   Medical History: Past Medical History:  Diagnosis Date   Arthritis    CHF (congestive heart failure) (HCC)    Chronic pain    CSF abnormal 07/15/2014   Fibromyalgia 07/15/2014   Hypertension    Scoliosis     Assessment: Pt is a 62 yo female presenting post cardiac cath procedure, now with found with R upper lobe PE.  Goal of Therapy:  Heparin level 0.3-0.7 units/ml Monitor platelets by anticoagulation protocol: Yes   Plan:  No initial bolus Start heparin infusion at 1100 units/hr Will check HL in 6 hr after start of infusion CBC daily while on heparin  Otelia Sergeant, PharmD, [facility] 08/18/2023 2:11 AM

## 2023-08-18 NOTE — Progress Notes (Signed)
[facility] PROGRESS NOTE  [name] (DOB: 06-10-1961) MVH:[id number] PCP: Marina Goodell, MD  Brief Narrative: Alicia Moyer is a 62 y.o. female with a history of arthritis, repeated right hip surgeries and chronic pain who presented to the ED on 08/17/2023 with abrupt central chest pain radiating to both arms, arriving as code STEMI, confirmed to have severe LAD disease with acute proximal plaque rupture and apical embolized thrombus for which proximal LAD DES was placed. CTA chest also demonstrated RUL pulmonary embolism. She remains in ICU on heparin infusion, DAPT without hypoxemia, having clinically stabilized.   Subjective: No further chest discomfort, no dyspnea now or previously. No leg swelling currently. She did travel by car to/from [facility] 11/1-11/3, but had no symptoms at that time. Developed nausea 11/7, but no other problems until the afternoon of presentation.  Objective: BP (!) 152/111   Pulse (!) 107   Temp 98.3 F (36.8 C) (Oral)   Resp (!) 23   Ht 5\' 4"  (1.626 m)   Wt 70.4 kg   SpO2 94%   BMI 26.64 kg/m   Gen: No distress Pulm: Clear, nonlabored  CV: RRR, NSR with rate nearly 100 on monitor. No JVD, no MRG or pitting LE edema. GI: Soft, NT, ND, +BS. No suprapubic tenderness. Neuro: Alert and oriented. No new focal deficits. Ext: Warm, no deformities. Skin: No new rashes, lesions or ulcers on visualized skin   Assessment & Plan: STEMI: s/I LAD PCI 11/11 by Dr. Okey Dupre.  - Continue post catheterization care per cardiology. Completing aggrastat. Will continue heparin another 24 hours. Plan is prasugrel + aspirin for at least 12 months, though since anticoagulation is indicated for the PE, may be single antiplatelet while on anticoagulation.  - Started high-intensity statin, though LDL 53, HDL 52. HbA1c 5.4%.   Chronic HFpEF, ICM:  - Echo pending, though LVEF wnl and filling pressure appeared ok during cath.  - Continue ARB.  - No indication for  diuresis at this time.   RUL pulmonary embolism: No evidence of R heart strain by CT or by exam.  - Echocardiogram pending.  - No DVT on LE venous U/S.  - If no RV strain by echo and remains without symptoms attributable to this, no hypoxemia, etc. she would probably be recommended to receive 3 months of anticoagulation after which we would transition back to DAPT.   AKI: SCr 1.23 on admission from baseline suspected to be normal (0.7-0.9 based on remote values). SCr has improved.  - Avoid nephrotoxins, renally dose medications.   Chronic pain:  - Continue medications as ordered, titrate as needed. Currently not in pain.   [name], MD [facility] [email or web address] 08/18/2023, 9:22 AM

## 2023-08-18 NOTE — Assessment & Plan Note (Signed)
Pt positive for RUL segmental and subsegmental PE with no noted right heart strain. LE venous doppler pending as pt is high risk we did evaluate her for both. Pt to be started on heparin gtt and AM team along with cardiology to come up with plan for antiplatelet and anticoagulation plan as pt needs both.  Currently pt to start heparin once Aggrastat infusion completes about 4:30 per pharmacy nathan .

## 2023-08-18 NOTE — Progress Notes (Signed)
ANTICOAGULATION CONSULT NOTE  Pharmacy Consult for heparin infusion Indication: pulmonary embolus  Allergies  Allergen Reactions   Clindamycin/Lincomycin Anaphylaxis    Rash,swelling of face and neck   Zolpidem Other (See Comments)   Patient Measurements: Height: 5\' 4"  (162.6 cm) Weight: 70.4 kg (155 lb 3.2 oz) IBW/kg (Calculated) : 54.7 Heparin Dosing Weight: 69 kg  Vital Signs: Temp Source: Oral (11/12 0200) BP: 152/111 (11/12 0915) Pulse Rate: 107 (11/12 0915)  Labs: Recent Labs    08/17/23 1800 08/17/23 2138 08/17/23 2317 08/18/23 0412 08/18/23 0958  HGB 14.0  --   --  13.9  --   HCT 42.2  --   --  41.2  --   PLT 271  --   --  271  --   APTT  --  >200* 75*  --   --   LABPROT  --  16.1*  --   --   --   INR  --  1.3*  --   --   --   HEPARINUNFRC  --   --   --   --  0.75*  CREATININE 1.23* 0.92  --  0.98  --   TROPONINIHS 71* 2,384*  --  10,831*  --    Estimated Creatinine Clearance: 57.3 mL/min (by C-G formula based on SCr of 0.98 mg/dL).  Medical History: Past Medical History:  Diagnosis Date   Arthritis    CHF (congestive heart failure) (HCC)    Chronic pain    CSF abnormal 07/15/2014   Fibromyalgia 07/15/2014   Hypertension    Scoliosis    Assessment: Pt is a 62 yo female presenting post cardiac cath procedure, now with found with R upper lobe PE.  Goal of Therapy:  Heparin level 0.3-0.7 units/ml Monitor platelets by anticoagulation protocol: Yes  Labs:  1112 0958 HL 0.75; supratherapeutic    Plan:  Decrease heparin infusion to 1000 units/hr  Check next HL 6 hours after rate adjustment  Monitor CBC daily while on heparin   Littie Deeds, PharmD Pharmacy Resident  08/18/2023 11:02 AM

## 2023-08-18 NOTE — Plan of Care (Signed)
  Problem: Education: Goal: Knowledge of General Education information will improve Description: Including pain rating scale, medication(s)/side effects and non-pharmacologic comfort measures Outcome: Progressing   Problem: Health Behavior/Discharge Planning: Goal: Ability to manage health-related needs will improve Outcome: Progressing   Problem: Clinical Measurements: Goal: Will remain free from infection Outcome: Progressing Goal: Respiratory complications will improve Outcome: Progressing   Problem: Nutrition: Goal: Adequate nutrition will be maintained Outcome: Progressing   Problem: Coping: Goal: Level of anxiety will decrease Outcome: Progressing   Problem: Elimination: Goal: Will not experience complications related to urinary retention Outcome: Progressing   Problem: Safety: Goal: Ability to remain free from injury will improve Outcome: Progressing   Problem: Skin Integrity: Goal: Risk for impaired skin integrity will decrease Outcome: Progressing

## 2023-08-19 ENCOUNTER — Inpatient Hospital Stay
Admit: 2023-08-19 | Discharge: 2023-08-19 | Disposition: A | Discharge status: Home / Self Care | Payer: Medicare Other | Attending: Internal Medicine | Admitting: Internal Medicine

## 2023-08-19 DIAGNOSIS — I952 Hypotension due to drugs: Secondary | ICD-10-CM

## 2023-08-19 DIAGNOSIS — I10 Essential (primary) hypertension: Secondary | ICD-10-CM | POA: Diagnosis not present

## 2023-08-19 DIAGNOSIS — N179 Acute kidney failure, unspecified: Secondary | ICD-10-CM | POA: Diagnosis not present

## 2023-08-19 DIAGNOSIS — I213 ST elevation (STEMI) myocardial infarction of unspecified site: Secondary | ICD-10-CM

## 2023-08-19 DIAGNOSIS — I2102 ST elevation (STEMI) myocardial infarction involving left anterior descending coronary artery: Secondary | ICD-10-CM | POA: Diagnosis not present

## 2023-08-19 DIAGNOSIS — I2699 Other pulmonary embolism without acute cor pulmonale: Secondary | ICD-10-CM | POA: Diagnosis not present

## 2023-08-19 DIAGNOSIS — R079 Chest pain, unspecified: Secondary | ICD-10-CM

## 2023-08-19 DIAGNOSIS — R001 Bradycardia, unspecified: Secondary | ICD-10-CM

## 2023-08-19 LAB — CBC
HCT: 39.6 % (ref 36.0–46.0)
HCT: 35.4 % — ABNORMAL LOW (ref 36.0–46.0)
HCT: 35.6 % — ABNORMAL LOW (ref 36.0–46.0)
Hemoglobin: 13.1 g/dL (ref 12.0–15.0)
Hemoglobin: 11.7 g/dL — ABNORMAL LOW (ref 12.0–15.0)
Hemoglobin: 11.7 g/dL — ABNORMAL LOW (ref 12.0–15.0)
MCH: 29.6 pg (ref 26.0–34.0)
MCH: 30.5 pg (ref 26.0–34.0)
MCH: 29.8 pg (ref 26.0–34.0)
MCHC: 33.1 g/dL (ref 30.0–36.0)
MCHC: 33.1 g/dL (ref 30.0–36.0)
MCHC: 32.9 g/dL (ref 30.0–36.0)
MCV: 89.6 fL (ref 80.0–100.0)
MCV: 92.2 fL (ref 80.0–100.0)
MCV: 90.6 fL (ref 80.0–100.0)
Platelets: 209 10*3/uL (ref 150–400)
Platelets: 189 10*3/uL (ref 150–400)
Platelets: 204 10*3/uL (ref 150–400)
RBC: 4.42 MIL/uL (ref 3.87–5.11)
RBC: 3.84 MIL/uL — ABNORMAL LOW (ref 3.87–5.11)
RBC: 3.93 MIL/uL (ref 3.87–5.11)
RDW: 12.9 % (ref 11.5–15.5)
RDW: 13.1 % (ref 11.5–15.5)
RDW: 12.8 % (ref 11.5–15.5)
WBC: 10.8 10*3/uL — ABNORMAL HIGH (ref 4.0–10.5)
WBC: 10.3 10*3/uL (ref 4.0–10.5)
WBC: 10.1 10*3/uL (ref 4.0–10.5)
nRBC: 0 % (ref 0.0–0.2)
nRBC: 0 % (ref 0.0–0.2)
nRBC: 0 % (ref 0.0–0.2)

## 2023-08-19 LAB — BASIC METABOLIC PANEL
Anion gap: 8 (ref 5–15)
Anion gap: 5 (ref 5–15)
BUN: 31 mg/dL — ABNORMAL HIGH (ref 8–23)
BUN: 32 mg/dL — ABNORMAL HIGH (ref 8–23)
CO2: 25 mmol/L (ref 22–32)
CO2: 23 mmol/L (ref 22–32)
Calcium: 8.3 mg/dL — ABNORMAL LOW (ref 8.9–10.3)
Calcium: 8.1 mg/dL — ABNORMAL LOW (ref 8.9–10.3)
Chloride: 104 mmol/L (ref 98–111)
Chloride: 106 mmol/L (ref 98–111)
Creatinine, Ser: 0.84 mg/dL (ref 0.44–1.00)
Creatinine, Ser: 1.1 mg/dL — ABNORMAL HIGH (ref 0.44–1.00)
GFR, Estimated: >60 mL/min (ref >60)
GFR, Estimated: 57 mL/min — ABNORMAL LOW (ref >60)
Glucose, Bld: 104 mg/dL — ABNORMAL HIGH (ref 70–99)
Glucose, Bld: 122 mg/dL — ABNORMAL HIGH (ref 70–99)
Potassium: 4.1 mmol/L (ref 3.5–5.1)
Potassium: 4.4 mmol/L (ref 3.5–5.1)
Sodium: 137 mmol/L (ref 135–145)
Sodium: 134 mmol/L — ABNORMAL LOW (ref 135–145)

## 2023-08-19 LAB — LACTIC ACID, PLASMA: Lactic Acid, Venous: 1.7 mmol/L (ref 0.5–1.9)

## 2023-08-19 LAB — PROCALCITONIN: Procalcitonin: <0.1 ng/mL

## 2023-08-19 LAB — ECHOCARDIOGRAM COMPLETE
AR max vel: 2.48 cm2
AV Area VTI: 2.19 cm2
AV Area mean vel: 2.36 cm2
AV Mean grad: 2 mm[Hg]
AV Peak grad: 4.4 mm[Hg]
Ao pk vel: 1.05 m/s
Area-P 1/2: 4.21 cm2
Calc EF: 55.9 %
Height: 64 in
MV VTI: 1.59 cm2
S' Lateral: 2.6 cm
Single Plane A2C EF: 64.7 %
Single Plane A4C EF: 51.1 %
Weight: 2483.2 [oz_av]

## 2023-08-19 LAB — HEPARIN LEVEL (UNFRACTIONATED): Heparin Unfractionated: 0.63 [IU]/mL (ref 0.30–0.70)

## 2023-08-19 LAB — LIPOPROTEIN A (LPA): Lipoprotein (a): <8.4 nmol/L (ref <75.0)

## 2023-08-19 LAB — TROPONIN I (HIGH SENSITIVITY): Troponin I (High Sensitivity): 3433 ng/L (ref <18)

## 2023-08-19 MED ORDER — NOREPINEPHRINE 4 MG/250ML-% IV SOLN: 0.0000 ug/min | INTRAVENOUS | Status: DC

## 2023-08-19 MED ORDER — NOREPINEPHRINE 4 MG/250ML-% IV SOLN
2.0000 ug/min | INTRAVENOUS | Status: DC
  Administered 2023-08-19: 2 ug/min via INTRAVENOUS

## 2023-08-19 MED ORDER — PANTOPRAZOLE SODIUM 20 MG PO TBEC
20.0000 mg | DELAYED_RELEASE_TABLET | Freq: Every day | ORAL | Status: DC
  Administered 2023-08-19 – 2023-08-21 (×3): 20 mg via ORAL
  Filled 2023-08-19 (×3): qty 1

## 2023-08-19 MED ORDER — APIXABAN 5 MG PO TABS
10.0000 mg | ORAL_TABLET | Freq: Two times a day (BID) | ORAL | Status: DC
  Administered 2023-08-19 – 2023-08-21 (×5): 10 mg via ORAL
  Filled 2023-08-19 (×5): qty 2

## 2023-08-19 MED ORDER — OXYCODONE HCL 5 MG PO TABS
5.0000 mg | ORAL_TABLET | Freq: Four times a day (QID) | ORAL | Status: DC | PRN
  Administered 2023-08-19 – 2023-08-20 (×2): 5 mg via ORAL
  Filled 2023-08-19 (×2): qty 1

## 2023-08-19 MED ORDER — SODIUM CHLORIDE 0.9 % IV BOLUS
1000.0000 mL | Freq: Once | INTRAVENOUS | Status: AC
  Administered 2023-08-19: 1000 mL via INTRAVENOUS

## 2023-08-19 MED ORDER — GABAPENTIN 300 MG PO CAPS
600.0000 mg | ORAL_CAPSULE | Freq: Two times a day (BID) | ORAL | Status: DC
  Administered 2023-08-19 – 2023-08-21 (×4): 600 mg via ORAL
  Filled 2023-08-19 (×4): qty 2

## 2023-08-19 MED ORDER — CLOPIDOGREL BISULFATE 75 MG PO TABS
75.0000 mg | ORAL_TABLET | Freq: Every day | ORAL | Status: DC
  Administered 2023-08-19 – 2023-08-21 (×3): 75 mg via ORAL
  Filled 2023-08-19 (×3): qty 1

## 2023-08-19 MED ORDER — TRAMADOL HCL ER 200 MG PO TB24: 200.0000 mg | ORAL_TABLET | Freq: Every day | ORAL | Status: DC

## 2023-08-19 MED ORDER — APIXABAN 5 MG PO TABS: 5.0000 mg | ORAL_TABLET | Freq: Two times a day (BID) | ORAL | Status: DC

## 2023-08-19 MED ORDER — OXYCODONE HCL 5 MG PO TABS: 10.0000 mg | ORAL_TABLET | Freq: Four times a day (QID) | ORAL | Status: DC | PRN

## 2023-08-19 MED ORDER — OXYCODONE HCL 5 MG PO TABS: 5.0000 mg | ORAL_TABLET | Freq: Four times a day (QID) | ORAL | Status: DC | PRN

## 2023-08-19 MED ORDER — TRAMADOL HCL 50 MG PO TABS: 100.0000 mg | ORAL_TABLET | Freq: Two times a day (BID) | ORAL | Status: DC

## 2023-08-19 MED ORDER — NOREPINEPHRINE 4 MG/250ML-% IV SOLN
INTRAVENOUS | Status: AC
  Filled 2023-08-19: qty 250

## 2023-08-19 MED ORDER — LIDOCAINE 5 % EX PTCH
1.0000 | MEDICATED_PATCH | Freq: Once | CUTANEOUS | Status: AC
  Administered 2023-08-19: 1 via TRANSDERMAL
  Filled 2023-08-19: qty 1

## 2023-08-19 MED ORDER — TIZANIDINE HCL 4 MG PO TABS
4.0000 mg | ORAL_TABLET | Freq: Once | ORAL | Status: AC
  Administered 2023-08-19: 4 mg via ORAL
  Filled 2023-08-19: qty 1

## 2023-08-19 NOTE — Plan of Care (Signed)
  Problem: Education: Goal: Knowledge of General Education information will improve Description: Including pain rating scale, medication(s)/side effects and non-pharmacologic comfort measures Outcome: Progressing   Problem: Clinical Measurements: Goal: Diagnostic test results will improve Outcome: Progressing Goal: Respiratory complications will improve Outcome: Progressing Goal: Cardiovascular complication will be avoided Outcome: Progressing   Problem: Pain Management: Goal: General experience of comfort will improve Outcome: Progressing   Problem: Skin Integrity: Goal: Risk for impaired skin integrity will decrease Outcome: Progressing   Problem: Activity: Goal: Ability to return to baseline activity level will improve Outcome: Progressing   Problem: Cardiovascular: Goal: Ability to achieve and maintain adequate cardiovascular perfusion will improve Outcome: Progressing Goal: Vascular access site(s) Level 0-1 will be maintained Outcome: Progressing

## 2023-08-19 NOTE — Progress Notes (Signed)
Progress Note  Patient Name: Alicia Moyer Date of Encounter: 08/19/2023  Primary Cardiologist: Agbor-Etang  Subjective   No further chest pain. No dyspnea, palpitations, dizziness, presyncope, or syncope. Notes longstanding hip and lumbar pain in the bed (chronic issue).   Inpatient Medications    Scheduled Meds:  aspirin  81 mg Oral Daily   atorvastatin  40 mg Oral Daily   carvedilol  3.125 mg Oral BID WC   Chlorhexidine Gluconate Cloth  6 each Topical Q0600   gabapentin  1,200 mg Oral BID   sodium chloride flush  3 mL Intravenous Q12H   Continuous Infusions:  heparin 900 Units/hr (08/19/23 0701)   PRN Meds: acetaminophen, ondansetron (ZOFRAN) IV, mouth rinse, oxyCODONE, sodium chloride flush, tiZANidine, traMADol   Vital Signs    Vitals:   08/19/23 0300 08/19/23 0400 08/19/23 0500 08/19/23 0700  BP: 101/61 99/64 117/82 (!) 140/103  Pulse: 82 83 81 92  Resp: (!) 22 (!) 25 15 12   Temp:  97.9 F (36.6 C)    TempSrc:  Axillary    SpO2: 97% 97% 96% 98%  Weight:      Height:        Intake/Output Summary (Last 24 hours) at 08/19/2023 0746 Last data filed at 08/19/2023 0701 Gross per 24 hour  Intake 481.01 ml  Output 800 ml  Net -318.99 ml   Filed Weights   08/17/23 1803  Weight: 70.4 kg    Telemetry    SR, 60s to 80s bpm - Personally Reviewed  ECG    Pending - Personally Reviewed  Physical Exam   GEN: No acute distress.   Neck: No JVD. Cardiac: RRR, no murmurs, rubs, or gallops.  Right radial arteriotomy site without active bleeding, swelling, warmth, erythema, or TTP.  Radial pulse 2+ proximal and distal to the arteriotomy site.  Respiratory: Clear to auscultation bilaterally.  GI: Soft, nontender, non-distended.   MS: No edema; No deformity. Neuro:  Alert and oriented x 3; Nonfocal.  Psych: Normal affect.  Labs    Chemistry Recent Labs  Lab 08/17/23 1800 08/17/23 2138 08/17/23 2317 08/18/23 0412 08/19/23 0152  NA 135  --   --  134*  137  K 4.6  --   --  4.9 4.1  CL 105  --   --  105 104  CO2 21*  --   --  24 25  GLUCOSE 145*  --   --  103* 104*  BUN 44*  --   --  44* 31*  CREATININE 1.23* 0.92  --  0.98 0.84  CALCIUM 8.9  --   --  8.5* 8.3*  PROT  --   --  6.0*  --   --   ALBUMIN  --   --  3.1*  --   --   AST  --   --  39  --   --   ALT  --   --  25  --   --   ALKPHOS  --   --  72  --   --   BILITOT  --   --  0.6  --   --   GFRNONAA 50* >60  --  >60 >60  ANIONGAP 9  --   --  5 8     Hematology Recent Labs  Lab 08/17/23 1800 08/18/23 0412 08/19/23 0152  WBC 14.7* 15.1* 10.8*  RBC 4.67 4.68 4.42  HGB 14.0 13.9 13.1  HCT 42.2 41.2 39.6  MCV  90.4 88.0 89.6  MCH 30.0 29.7 29.6  MCHC 33.2 33.7 33.1  RDW 13.2 13.1 12.9  PLT 271 271 209    Cardiac EnzymesNo results for input(s): "TROPONINI" in the last 168 hours. No results for input(s): "TROPIPOC" in the last 168 hours.   BNPNo results for input(s): "BNP", "PROBNP" in the last 168 hours.   DDimer No results for input(s): "DDIMER" in the last 168 hours.   Radiology    US Venous Img Lower Bilateral (DVT)  Result Date: 08/18/2023 IMPRESSION: No evidence of deep venous thrombosis in either lower extremity. Electronically Signed   By: Aram Candela M.D.   On: 08/18/2023 04:44   CT Angio Chest Pulmonary Embolism (PE) W or WO Contrast  Result Date: 08/17/2023 IMPRESSION: 1. Right upper lobe segmental and subsegmental pulmonary emboli. No pulmonary infarction or right heart strain. 2.  Small hiatal hernia. These results were called by telephone at the time of interpretation on 08/17/2023 at 11:27 pm to provider EKTA PATEL , who verbally acknowledged these results. Electronically Signed   By: Tish Frederickson M.D.   On: 08/17/2023 23:30   Cardiac Studies   LHC 08/17/2023: Conclusions: Severe single-vessel coronary artery disease with acute plaque rupture in the proximal LAD leading to 70% stenosis in the proximal LAD and occlusion of the apical LAD  from embolized thrombus.  No angiographically significant CAD noted in the LCx or RCA. Preserved left ventricular systolic function (LVEF 55-65%) with apical akinesis. Normal left ventricular filling pressure (LVEDP 10 mmHg). Successful OCT-guided PCI to the proximal LAD using Onyx Frontier 4.0 x 15 mm drug-eluting stent with 0% residual stenosis and TIMI-2 flow from improving microvascular dysfunction.   Recommendations: Admit to ICU/stepdown for post-STEMI care. Continue tirofiban infusion for 6 hours. Dual antiplatelet therapy with aspirin and prasugrel for at least 12 months. Aggressive secondary prevention of coronary artery disease, including high-intensity statin therapy. Gentle post-catheterization hydration. __________  2D echo pending  Patient Profile     62 y.o. female with history of HFpEF, HTN, and chronic pain who was admitted with an anterior STEMI and incidentally found to subsequently have a right upper lobe segmental and subsegmental PE.  Assessment & Plan    1. Anterior STEMI: -No symptoms of angina or cardiac decompensation  -With incidentally noted PE, she was transitioned her from Effient to Plavix with a 600 mg loading dose on 11/12, followed by Plavix 75 mg daily thereafter to start on 11/13 -We will plan to maintain her on triple therapy with ASA 81 mg daily, Plavix 75 mg daily, and Eliquis (PE dosing of 10 mg bid x 7 days then 5 mg bid thereafter) for 1 month. At that time, ASA should stopped and she should remain on Eliquis and Plavix. Once she has completed therapy of Eliquis, ASA should be resumed with continuation of Plavix to complete 12 months of DAPT -Add Protonix 20 mg given triple therapy  -Echo pending -Lipitor 40 mg, LDL 53 this admission -LP(a) pending -Coreg 3.125 mg bid -Cardiac rehab -Aggressive risk factor modification and secondary prevention  -Post cath instructions  2. PE: -Incidentally noted -No evidence of pulmonary infarction or  right heart strain  -Echo pending -Transition to Eliquis 10 mg bid x 7 days then 5 mg bid thereafter for 3-6 months -Query unprovoked PE, would benefit from hematology evaluation as an outpatient for hypercoagulable work up  -Monitor CBC  3. NSVT: -No further episodes -Asymptomatic -Brief runs -Coreg as above -Potassium at goal -Check magnesium  4. HTN: -Blood pressure mildly elevated -Now on Coreg as above    For questions or updates, please contact [facility] Please consult [email or web address] for contact info under Cardiology/STEMI.    Signed, Eula Listen, PA-C [facility] Pager: [phone] 08/19/2023, 7:46 AM

## 2023-08-19 NOTE — Progress Notes (Signed)
PROGRESS NOTE    PAIJE WINWOOD   EXB:[id number] DOB: Apr 22, 1961  DOA: 08/17/2023 Date of Service: 08/19/23 which is hospital day 2  PCP: Marina Goodell, MD    HPI: YENA DEETS is a 62 y.o. female with a history of arthritis, repeated right hip surgeries and chronic pain who presented to the ED on 08/17/2023 with abrupt central chest pain radiating to both arms, arriving as code STEMI   Hospital course / significant events:  confirmed to have severe LAD disease with acute proximal plaque rupture and apical embolized thrombus for which proximal LAD DES was placed. CTA chest also demonstrated RUL pulmonary embolism. She remains in ICU on heparin infusion, DAPT without hypoxemia, having clinically stabilized.  11/13 significant hypotension, levophed initiated, w/u thus far nonrevelaing likely medication effect, echo done and pending read   Consultants:  Cardiology   Procedures/Surgeries: 11/11: cardaic cath w/ PCI to LAD - Dr End       ASSESSMENT & PLAN:   STEMI: s/I LAD PCI 11/11 by Dr. Okey Dupre.  CAD No symptoms of angina or cardiac decompensation  With incidentally noted PE, she was transitioned from Effient to Plavix with a 600 mg loading dose on 11/12, followed by Plavix 75 mg daily thereafter to start on 11/13 triple therapy with ASA 81 mg daily, Plavix 75 mg daily, and Eliquis (PE dosing of 10 mg bid x 7 days then 5 mg bid thereafter) for 1 month.  At that time, ASA should stopped and she should remain on Eliquis and Plavix.  Once she has completed therapy of Eliquis, ASA should be resumed with continuation of Plavix to complete 12 months of DAPT Add Protonix 20 mg given triple therapy  Echo pending read Lipitor 40 mg, LDL 53 this admission, LP(a) pending Coreg 3.125 mg bid - holding parameters d/t low BP Cardiac rehab Aggressive risk factor modification and secondary prevention  Post cath instructions  Hypotension Concern for opiate / beta blocker / combination  medication effect, Ddx would also include bleeding (no signs, no severe Hgb drop on CBC), low cardiac output heart failure, new MI Recheck STAT CBC - mild Hgb drop more c/w hemodilution, no s/s hemorrhage, no pain  Troponins down-trending  AKI concern for hypotensive / prerenal Cr 0.8 --> 1.10 Lactic acid normal  EKG obtained showing nonspecific ST elevation in lead III, T wave inversions 2 3 aVF, anterolateral leads V4 5 6 No significant change compared to EKG performed yesterday Echo read pending Norepinephrine infusion initiated  Holding parameters on coreg D/c or reduced pain medications - d/c tramadol, reduced dose/frequency oxycodone, reduced gabapentin, stopped tizanidine  Cardiology also following - see Dr Windell Hummingbird note  Continue norepi and close VS monitoring  Repeat CBC for trend - no symptoms bleeding but would have low threshold for CT chest/abd/pelv if Hgb low and BP not improving  Remain in stepdown  Critical care consult   Chronic HFpEF, ICM:  - Echo pending, though LVEF wnl and filling pressure appeared ok during cath.  Continue ARB.  No indication for diuresis at this time.    RUL pulmonary embolism:  No evidence of R heart strain by CT or by exam.  No DVT on LE venous U/S.  Echocardiogram pending read.  Eliquis 10 mg bid x 7 days then 5 mg bid thereafter for 3-6 months  Query unprovoked PE, may benefit from hematology evaluation as an outpatient for hypercoagulable work up  Monitor CBC   NSVT: No further episodes Asymptomatic Brief  runs Coreg as above Potassium at goal Check magnesium    HTN: Blood pressure mildly elevated Now on Coreg as above  AKI: SCr 1.23 on admission from baseline suspected to be normal (0.7-0.9 based on remote values). SCr has improved.  Avoid nephrotoxins, renally dose medications.  Monitor BMP     Overweight based on BMI: Body mass index is 26.64 kg/m.  Underweight - under 18.5  normal weight - 18.5 to 24.9 overweight - 25  to 29.9 obese - 30 or more   DVT prophylaxis: eliquis IV fluids: no continuous IV fluids, s/p boluses, now on norepi in setting of IV fluids shortage Nutrition: cardaic diet  Central lines / invasive devices: none  Code Status: FULL CODE ACP documentation reviewed: 08/19/23 and none on file in [facility]  TOC needs: TBD Barriers to dispo / significant pending items: hypotensive              Subjective / Brief ROS:  Patient reports dizziness Denies CP/SOB.  Pain controlled.  Denies new weakness.    Family Communication: none at this time will call today w/ updates     Objective Findings:  Vitals:   08/19/23 1445 08/19/23 1500 08/19/23 1515 08/19/23 1600  BP: (!) 100/59 (!) 94/59 (!) 90/58 98/73  Pulse: (!) 50 (!) 47 (!) 52 68  Resp: 19 11 12 14   Temp:      TempSrc:      SpO2: 99% 98% 98% 99%  Weight:      Height:        Intake/Output Summary (Last 24 hours) at 08/19/2023 1621 Last data filed at 08/19/2023 1500 Gross per 24 hour  Intake 1274.55 ml  Output 800 ml  Net 474.55 ml   Filed Weights   08/17/23 1803  Weight: 70.4 kg    Examination:  Physical Exam Constitutional:      General: She is not in acute distress.    Appearance: She is ill-appearing.     Comments: Pale this morning, sleepy but easily awoken   HENT:     Mouth/Throat:     Mouth: Mucous membranes are dry.  Cardiovascular:     Rate and Rhythm: Regular rhythm. Bradycardia present.  Pulmonary:     Effort: Pulmonary effort is normal.     Breath sounds: Normal breath sounds.  Abdominal:     General: Abdomen is flat. Bowel sounds are normal.     Palpations: Abdomen is soft.  Musculoskeletal:     Right lower leg: No edema.     Left lower leg: No edema.  Skin:    General: Skin is dry.     Coloration: Skin is pale.  Neurological:     General: No focal deficit present.     Mental Status: She is alert and oriented to person, place, and time.  Psychiatric:        Mood and Affect:  Mood normal.        Behavior: Behavior normal.          Scheduled Medications:   apixaban  10 mg Oral BID   Followed by   Melene Muller ON 08/26/2023] apixaban  5 mg Oral BID   aspirin  81 mg Oral Daily   atorvastatin  40 mg Oral Daily   Chlorhexidine Gluconate Cloth  6 each Topical Q0600   clopidogrel  75 mg Oral Daily   gabapentin  600 mg Oral BID   pantoprazole  20 mg Oral Daily   sodium chloride flush  3 mL  Intravenous Q12H    Continuous Infusions:  norepinephrine (LEVOPHED) Adult infusion 5 mcg/min (08/19/23 1500)    PRN Medications:  acetaminophen, ondansetron (ZOFRAN) IV, mouth rinse, oxyCODONE, sodium chloride flush  Antimicrobials from admission:  Anti-infectives (From admission, onward)    None           Data Reviewed:  I have personally reviewed the following...  CBC: Recent Labs  Lab 08/17/23 1800 08/18/23 0412 08/19/23 0152 08/19/23 1326  WBC 14.7* 15.1* 10.8* 10.3  HGB 14.0 13.9 13.1 11.7*  HCT 42.2 41.2 39.6 35.4*  MCV 90.4 88.0 89.6 92.2  PLT 271 271 209 189   Basic Metabolic Panel: Recent Labs  Lab 08/17/23 1800 08/17/23 2138 08/18/23 0412 08/19/23 0152 08/19/23 1326  NA 135  --  134* 137 134*  K 4.6  --  4.9 4.1 4.4  CL 105  --  105 104 106  CO2 21*  --  24 25 23   GLUCOSE 145*  --  103* 104* 122*  BUN 44*  --  44* 31* 32*  CREATININE 1.23* 0.92 0.98 0.84 1.10*  CALCIUM 8.9  --  8.5* 8.3* 8.1*   GFR: Estimated Creatinine Clearance: 51.1 mL/min (A) (by C-G formula based on SCr of 1.1 mg/dL (H)). Liver Function Tests: Recent Labs  Lab 08/17/23 2317  AST 39  ALT 25  ALKPHOS 72  BILITOT 0.6  PROT 6.0*  ALBUMIN 3.1*   No results for input(s): "LIPASE", "AMYLASE" in the last 168 hours. No results for input(s): "AMMONIA" in the last 168 hours. Coagulation Profile: Recent Labs  Lab 08/17/23 2138  INR 1.3*   Cardiac Enzymes: No results for input(s): "CKTOTAL", "CKMB", "CKMBINDEX", "TROPONINI" in the last 168 hours. BNP  (last 3 results) No results for input(s): "PROBNP" in the last 8760 hours. HbA1C: Recent Labs    08/17/23 2138  HGBA1C 5.4   CBG: Recent Labs  Lab 08/17/23 2005  GLUCAP 120*   Lipid Profile: Recent Labs    08/17/23 2138  CHOL 116  HDL 52  LDLCALC 53  TRIG 54  CHOLHDL 2.2   Thyroid Function Tests: No results for input(s): "TSH", "T4TOTAL", "FREET4", "T3FREE", "THYROIDAB" in the last 72 hours. Anemia Panel: No results for input(s): "VITAMINB12", "FOLATE", "FERRITIN", "TIBC", "IRON", "RETICCTPCT" in the last 72 hours. Most Recent Urinalysis On File:     Component Value Date/Time   COLORURINE YELLOW (A) 12/25/2021 1833   APPEARANCEUR HAZY (A) 12/25/2021 1833   LABSPEC 1.014 12/25/2021 1833   PHURINE 5.0 12/25/2021 1833   GLUCOSEU NEGATIVE 12/25/2021 1833   HGBUR LARGE (A) 12/25/2021 1833   BILIRUBINUR NEGATIVE 12/25/2021 1833   KETONESUR NEGATIVE 12/25/2021 1833   PROTEINUR NEGATIVE 12/25/2021 1833   NITRITE NEGATIVE 12/25/2021 1833   LEUKOCYTESUR MODERATE (A) 12/25/2021 1833   Sepsis Labs: @LABRCNTIP (procalcitonin:4,lacticidven:4) Microbiology: Recent Results (from the past 240 hour(s))  MRSA Next Gen by PCR, Nasal     Status: None   Collection Time: 08/17/23  8:15 PM   Specimen: Nasal Mucosa; Nasal Swab  Result Value Ref Range Status   MRSA by PCR Next Gen NOT DETECTED NOT DETECTED Final    Comment: (NOTE) The GeneXpert MRSA Assay (FDA approved for NASAL specimens only), is one component of a comprehensive MRSA colonization surveillance program. It is not intended to diagnose MRSA infection nor to guide or monitor treatment for MRSA infections. Test performance is not FDA approved in patients less than 53 years old. Performed at [facility], [address] [place],  [place] [address]       Radiology Studies last 3 days: US Venous Img Lower Bilateral (DVT)  Result Date: 08/18/2023 CLINICAL DATA:  Bilateral lower extremity edema. EXAM:  BILATERAL LOWER EXTREMITY VENOUS DOPPLER ULTRASOUND TECHNIQUE: Gray-scale sonography with graded compression, as well as color Doppler and duplex ultrasound were performed to evaluate the lower extremity deep venous systems from the level of the common femoral vein and including the common femoral, femoral, profunda femoral, popliteal and calf veins including the posterior tibial, peroneal and gastrocnemius veins when visible. The superficial great saphenous vein was also interrogated. Spectral Doppler was utilized to evaluate flow at rest and with distal augmentation maneuvers in the common femoral, femoral and popliteal veins. COMPARISON:  December 25, 2021 FINDINGS: RIGHT LOWER EXTREMITY Common Femoral Vein: No evidence of thrombus. Normal compressibility, respiratory phasicity and response to augmentation. Saphenofemoral Junction: No evidence of thrombus. Normal compressibility and flow on color Doppler imaging. Profunda Femoral Vein: No evidence of thrombus. Normal compressibility and flow on color Doppler imaging. Femoral Vein: No evidence of thrombus. Normal compressibility, respiratory phasicity and response to augmentation. Popliteal Vein: No evidence of thrombus. Normal compressibility, respiratory phasicity and response to augmentation. Calf Veins: No evidence of thrombus. Normal compressibility and flow on color Doppler imaging. Superficial Great Saphenous Vein: No evidence of thrombus. Normal compressibility. Venous Reflux:  None. Other Findings:  None. LEFT LOWER EXTREMITY Common Femoral Vein: No evidence of thrombus. Normal compressibility, respiratory phasicity and response to augmentation. Saphenofemoral Junction: No evidence of thrombus. Normal compressibility and flow on color Doppler imaging. Profunda Femoral Vein: No evidence of thrombus. Normal compressibility and flow on color Doppler imaging. Femoral Vein: No evidence of thrombus. Normal compressibility, respiratory phasicity and response to  augmentation. Popliteal Vein: No evidence of thrombus. Normal compressibility, respiratory phasicity and response to augmentation. Calf Veins: No evidence of thrombus. Normal compressibility and flow on color Doppler imaging. Superficial Great Saphenous Vein: No evidence of thrombus. Normal compressibility. Venous Reflux:  None. Other Findings:  None. IMPRESSION: No evidence of deep venous thrombosis in either lower extremity. Electronically Signed   By: Aram Candela M.D.   On: 08/18/2023 04:44   CT Angio Chest Pulmonary Embolism (PE) W or WO Contrast  Result Date: 08/17/2023 CLINICAL DATA:  Pulmonary embolism (PE) suspected, high prob. Dizzy, chest pain, sob, lower extremity swelling. Hip surgery EXAM: CT ANGIOGRAPHY CHEST WITH CONTRAST TECHNIQUE: Multidetector CT imaging of the chest was performed using the standard protocol during bolus administration of intravenous contrast. Multiplanar CT image reconstructions and MIPs were obtained to evaluate the vascular anatomy. RADIATION DOSE REDUCTION: This exam was performed according to the departmental dose-optimization program which includes automated exposure control, adjustment of the mA and/or kV according to patient size and/or use of iterative reconstruction technique. CONTRAST:  75mL OMNIPAQUE IOHEXOL 350 MG/ML SOLN COMPARISON:  None Available. FINDINGS: Cardiovascular: Satisfactory opacification of the pulmonary arteries to the segmental level. Right upper lobe segmental and subsegmental pulmonary emboli. Normal heart size. No significant pericardial effusion. The thoracic aorta is normal in caliber. Mild atherosclerotic plaque of the thoracic aorta. Left anterior descending coronary artery stent. Tortuous descending thoracic aorta. Mediastinum/Nodes: Small hiatal hernia. No enlarged mediastinal, hilar, or axillary lymph nodes. Thyroid gland, trachea, and esophagus demonstrate no significant findings. Lungs/Pleura: No focal consolidation. No pulmonary  nodule. No pulmonary mass. No pleural effusion. No pneumothorax. Upper Abdomen: No acute abnormality. Musculoskeletal: No chest wall abnormality.  Bilateral breast implants. No suspicious lytic or blastic osseous lesions. No acute displaced fracture. Multilevel  degenerative changes of the spine. Chronic T9 vertebral body height loss. Review of the MIP images confirms the above findings. IMPRESSION: 1. Right upper lobe segmental and subsegmental pulmonary emboli. No pulmonary infarction or right heart strain. 2.  Small hiatal hernia. These results were called by telephone at the time of interpretation on 08/17/2023 at 11:27 pm to provider EKTA PATEL , who verbally acknowledged these results. Electronically Signed   By: Tish Frederickson M.D.   On: 08/17/2023 23:30   CARDIAC CATHETERIZATION  Result Date: 08/17/2023 Conclusions: Severe single-vessel coronary artery disease with acute plaque rupture in the proximal LAD leading to 70% stenosis in the proximal LAD and occlusion of the apical LAD from embolized thrombus.  No angiographically significant CAD noted in the LCx or RCA. Preserved left ventricular systolic function (LVEF 55-65%) with apical akinesis. Normal left ventricular filling pressure (LVEDP 10 mmHg). Successful OCT-guided PCI to the proximal LAD using Onyx Frontier 4.0 x 15 mm drug-eluting stent with 0% residual stenosis and TIMI-2 flow from improving microvascular dysfunction. Recommendations: Admit to ICU/stepdown for post-STEMI care. Continue tirofiban infusion for 6 hours. Dual antiplatelet therapy with aspirin and prasugrel for at least 12 months. Aggressive secondary prevention of coronary artery disease, including high-intensity statin therapy. Gentle post-catheterization hydration. Yvonne Kendall, MD [facility]      Time spent: 50 min    Sunnie Nielsen, DO [facility] 08/19/2023, 4:21 PM    Dictation software may have been used to generate the above note. Typos may  occur and escape review in typed/dictated notes. Please contact Dr Lyn Hollingshead directly for clarity if needed.  Staff may message me via secure chat in Epic  but this may not receive an immediate response,  please page me for urgent matters!  If 7PM-7AM, please contact night coverage [email or web address]

## 2023-08-19 NOTE — Progress Notes (Signed)
Contacted by ICU nursing for relatively acute hypotension and bradycardia Patient was sitting in the chair when she felt general malaise, hypotension noted with rates in the low 50s Was helped back to bed in supine position Blood pressure remains low dropping as low as 66 systolic Started on IV fluids, given 1 L fluids with no significant improvement in pressure Reports dry mouth  On review of medication she has received gabapentin 1200 mg this morning, tizanidine 4 mg, tramadol 100 mg, Coreg 3.125 She reports that she does take gabapentin twice daily, tizanidine daily, long-acting tramadol (sometimes more than 1 a day) for back pain, joint pain  Given persistent hypotension, thready pulse, blood pressure confirmed manually which was low consistent with automated pressures She was started on Levophed for blood pressure support  EKG obtained showing nonspecific ST elevation in lead III, T wave inversions 2 3 aVF, anterolateral leads V4 5 6 No significant change compared to EKG performed yesterday  She did receive Eliquis 10 twice daily for PE this morning also on aspirin Plavix No signs of bleeding, right radial aspect site is normal-appearing, denies any neurologic issues, no abdominal pain, no chest pain concerning for angina  Lab work pending including CBC, BMP, troponin  Signed, Dossie Arbour, MD, Ph.D [facility]

## 2023-08-19 NOTE — Progress Notes (Signed)
*  PRELIMINARY RESULTS* Echocardiogram 2D Echocardiogram has been performed.  Carolyne Fiscal 08/19/2023, 2:28 PM

## 2023-08-19 NOTE — Progress Notes (Signed)
ANTICOAGULATION CONSULT NOTE  Pharmacy Consult for heparin infusion Indication: pulmonary embolus  Allergies  Allergen Reactions   Clindamycin/Lincomycin Anaphylaxis    Rash,swelling of face and neck   Zolpidem Other (See Comments)   Patient Measurements: Height: 5\' 4"  (162.6 cm) Weight: 70.4 kg (155 lb 3.2 oz) IBW/kg (Calculated) : 54.7 Heparin Dosing Weight: 69 kg  Vital Signs: Temp: 98.6 F (37 C) (11/12 1915) Temp Source: Oral (11/12 1915) BP: 95/61 (11/13 0230) Pulse Rate: 85 (11/13 0230)  Labs: Recent Labs    08/17/23 1800 08/17/23 2138 08/17/23 2317 08/18/23 0412 08/18/23 0958 08/18/23 1738 08/19/23 0152  HGB 14.0  --   --  13.9  --   --  13.1  HCT 42.2  --   --  41.2  --   --  39.6  PLT 271  --   --  271  --   --  209  APTT  --  >200* 75*  --   --   --   --   LABPROT  --  16.1*  --   --   --   --   --   INR  --  1.3*  --   --   --   --   --   HEPARINUNFRC  --   --   --   --  0.75* 0.72* 0.63  CREATININE 1.23* 0.92  --  0.98  --   --  0.84  TROPONINIHS 71* 2,384*  --  10,831*  --   --   --    Estimated Creatinine Clearance: 66.9 mL/min (by C-G formula based on SCr of 0.84 mg/dL).  Medical History: Past Medical History:  Diagnosis Date   Arthritis    CHF (congestive heart failure) (HCC)    Chronic pain    CSF abnormal 07/15/2014   Fibromyalgia 07/15/2014   Hypertension    Scoliosis    Assessment: Pt is a 62 yo female presenting post cardiac cath procedure, now with found with R upper lobe PE.  Goal of Therapy:  Heparin level 0.3-0.7 units/ml Monitor platelets by anticoagulation protocol: Yes  Labs:  1112 0958 HL 0.75; supratherapeutic  1112 1738 HL 0.72, supratherapeutic 1113 0152 HL 0.63, therapeutic x 1  Per lab, level drawn appropriately. No signs/symptoms of bleeding noted.   Plan:  Continue heparin infusion to 900 units/hr  Recheck next HL 6 hours to confirm Monitor CBC daily while on heparin   Thank you for involving pharmacy in  this patient's care.   Otelia Sergeant, PharmD, [facility] 08/19/2023 3:11 AM

## 2023-08-19 NOTE — Consult Note (Addendum)
NAME:  Alicia Moyer, MRN:  [id number], DOB:  03/07/1961, LOS: 2 ADMISSION DATE:  08/17/2023, CONSULTATION DATE: 08/19/2023 REFERRING MD: Dr. Mariah Milling, CHIEF COMPLAINT: Hypotension    History of Present Illness:  This is a 62 yo female who presented to [facility] ER on 11/11 from home via EMS with c/o chest pain radiating down both arms, nausea, and shortness of breath.  Upon EMS arrival on the scene they administered 324 mg of aspirin, 1 spray of nitroglycerin, and 50 mcg of fentanyl.  Code STEMI initiated by EMS.  ED Course  In the ER initial and repeat EKG concerning for ST elevation in lead II, III, and avF.  Cardiologist Dr. Okey Dupre evaluated pt and pt transferred emergently to the cardiac cath lab.   Cardiac cath findings were severe single-vessel CAD with acute plaque rupture in the proximal LAD leading to 70% stenosis in the proximal LAD and occlusion of the apical LAD from embolized thrombus with successful OCT-guided PCT to the proximal LAD using drug-eluting stent.  She was subsequently admitted to the stepdown unit post cardiac cath for additional workup and treatment per hospitalist team.  See detailed hospital course below for additional workup and treatment.    Cardiac Cath: severe single-vessel coronary artery disease with acute plaque rupture in the proximal LAD leading to 70% stenosis in the proximal LAD and occlusion of the apical LAD from embolized thrombus.  No angiographically significant CAD noted in the LCx or RCA. Preserved left ventricular systolic function (LVEF 55-65%) with apical akinesis. Normal left ventricular filling pressure (LVEDP 10 mmHg). Successful OCT-guided PCI to the proximal LAD using Onyx Frontier 4.0 x 15 mm drug-eluting stent with 0% residual stenosis and TIMI-2 flow from improving microvascular dysfunction. Cardiology recommendations dual antiplatelet therapy with aspirin and prasugrel for at least 12 months. Aggressive secondary prevention of coronary artery disease,  including high-intensity statin therapy  Pertinent  Medical History  Arthrititis  Chronic Pain  Fibromyalgia  HTN Scoliosis  HFpEF   Significant Hospital Events: Including procedures, antibiotic start and stop dates in addition to other pertinent events   11/11: Pt admitted to the stepdown unit with anterior STEMI s/p PCI to the         proximal LAD.  CTA Chest PE revealed right upper lobe segmental and         subsegmental  pulmonary emboli. No pulmonary infarction or right heart strain. 11/13: Pt transferred to ICU with hypotension despite 1L IV fluid bolus requiring peripheral levophed gtt.  EKG obtained showing nonspecific ST elevation in lead III, T wave inversions 2 3 aVF, anterolateral leads V4 5 6 Cardiology evaluated pt at bedside.  PCCM team consulted to assist with management   Interim History / Subjective:  Pt resting comfortably in bed in no distress.  Currently requiring levophed gtt @5  mcg/min to maintain map 65 or higher.  Currently sinus brady with hr 45 to 50's on cardiac monitor   Objective   Blood pressure (!) 77/57, pulse (!) 55, temperature 98.3 F (36.8 C), temperature source Oral, resp. rate 19, height 5\' 4"  (1.626 m), weight 70.4 kg, SpO2 100%.        Intake/Output Summary (Last 24 hours) at 08/19/2023 1321 Last data filed at 08/19/2023 1304 Gross per 24 hour  Intake 655.32 ml  Output 800 ml  Net -144.68 ml   Filed Weights   08/17/23 1803  Weight: 70.4 kg    Examination: General: Acutely-ill appearing female, NAD  HENT: Supple, no JVD  Lungs: Clear throughout, even, non labored  Cardiovascular: Sinus bradycardia, s1s2, no m/r/g, 2+radial/2+ distal pulses, no edema  Abdomen: +BS x4, soft, non tender, non distended  Extremities: Normal bulk and tone, moves all extremities  Neuro: Alert and oriented, following commands, PERRLA  GU: Voiding   Resolved Hospital Problem list     Assessment & Plan:   #Anterior STEMI s/p PCI to proximal LAD   #HFpEF  - Continuous telemetry monitoring  - Continue apixaban and clopidogrel  - Troponin trending down  - Cardiology consulted appreciate input: recommending dual antiplatelet therapy with aspirin and prasugrel for at least 12 months and aggressive secondary prevention of coronary artery disease, including high-intensity statin therapy  #Hypotension suspect secondary to beta-blocker therapy  Hx: HTN  - Prn levophed gtt to maintain map 65 or higher - Hold beta-blockers and outpatient antihypertensives for now  - Gabapentin dose decreased from 1200 mg bid to 600 mg bid  - Echo pending  - Lactic acid 1.7  #RUL pulmonary emboli~(possibly provoked she states she had a long car ride the first week in Nov) Venous US Bilateral Lower Extremity 08/17/23: No evidence of deep venous thrombosis in either lower extremity - Continue apixaban  - Hypercoagulable panel pending   - Supplemental O2 for dyspnea and/or hypoxia  - Maintain O2 sats 92% or higher   #Anemia without obvious acute blood loss  - Trend CBC  - Monitor for s/sx of bleeding  - Transfuse for hgb less than 8  Best Practice (right click and "Reselect all SmartList Selections" daily)   Diet/type: Regular consistency (see orders) DVT prophylaxis: Apixaban  GI prophylaxis: PPI Lines: N/A Foley:  N/A Code Status:  full code Last date of multidisciplinary goals of care discussion [N/A]  11/13: Updated pt regarding current plan of care and all questions answered Labs   CBC: Recent Labs  Lab 08/17/23 1800 08/18/23 0412 08/19/23 0152  WBC 14.7* 15.1* 10.8*  HGB 14.0 13.9 13.1  HCT 42.2 41.2 39.6  MCV 90.4 88.0 89.6  PLT 271 271 209    Basic Metabolic Panel: Recent Labs  Lab 08/17/23 1800 08/17/23 2138 08/18/23 0412 08/19/23 0152  NA 135  --  134* 137  K 4.6  --  4.9 4.1  CL 105  --  105 104  CO2 21*  --  24 25  GLUCOSE 145*  --  103* 104*  BUN 44*  --  44* 31*  CREATININE 1.23* 0.92 0.98 0.84  CALCIUM 8.9   --  8.5* 8.3*   GFR: Estimated Creatinine Clearance: 66.9 mL/min (by C-G formula based on SCr of 0.84 mg/dL). Recent Labs  Lab 08/17/23 1800 08/18/23 0412 08/19/23 0152  WBC 14.7* 15.1* 10.8*    Liver Function Tests: Recent Labs  Lab 08/17/23 2317  AST 39  ALT 25  ALKPHOS 72  BILITOT 0.6  PROT 6.0*  ALBUMIN 3.1*   No results for input(s): "LIPASE", "AMYLASE" in the last 168 hours. No results for input(s): "AMMONIA" in the last 168 hours.  ABG No results found for: "PHART", "PCO2ART", "PO2ART", "HCO3", "TCO2", "ACIDBASEDEF", "O2SAT"   Coagulation Profile: Recent Labs  Lab 08/17/23 2138  INR 1.3*    Cardiac Enzymes: No results for input(s): "CKTOTAL", "CKMB", "CKMBINDEX", "TROPONINI" in the last 168 hours.  HbA1C: Hgb A1c MFr Bld  Date/Time Value Ref Range Status  08/17/2023 09:38 PM 5.4 4.8 - 5.6 % Final    Comment:    (NOTE) Pre diabetes:  5.7%-6.4%  Diabetes:              >6.4%  Glycemic control for   <7.0% adults with diabetes     CBG: Recent Labs  Lab 08/17/23 2005  GLUCAP 120*    Review of Systems: Positives in BOLD   Gen: Denies fever, chills, weight change, fatigue, night sweats HEENT: Denies blurred vision, double vision, hearing loss, tinnitus, sinus congestion, rhinorrhea, sore throat, neck stiffness, dysphagia PULM: Denies shortness of breath, cough, sputum production, hemoptysis, wheezing CV: Denies chest pain, edema, orthopnea, paroxysmal nocturnal dyspnea, palpitations GI: Denies abdominal pain, nausea, vomiting, diarrhea, hematochezia, melena, constipation, change in bowel habits GU: Denies dysuria, hematuria, polyuria, oliguria, urethral discharge Endocrine: Denies hot or cold intolerance, polyuria, polyphagia or appetite change Derm: Denies rash, dry skin, scaling or peeling skin change Heme: Denies easy bruising, bleeding, bleeding gums Neuro: some blurred vision, headache, numbness, weakness, slurred speech, loss of  memory or consciousness  Past Medical History:  She,  has a past medical history of Arthritis, CHF (congestive heart failure) (HCC), Chronic pain, CSF abnormal (07/15/2014), Fibromyalgia (07/15/2014), Hypertension, and Scoliosis.   Surgical History:   Past Surgical History:  Procedure Laterality Date   ABDOMINAL HYSTERECTOMY  2000   CORONARY IMAGING/OCT N/A 08/17/2023   Procedure: CORONARY IMAGING/OCT;  Surgeon: Yvonne Kendall, MD;  Location: [facility] INVASIVE CV LAB;  Service: Cardiovascular;  Laterality: N/A;   CORONARY/GRAFT ACUTE MI REVASCULARIZATION N/A 08/17/2023   Procedure: Coronary/Graft Acute MI Revascularization;  Surgeon: Yvonne Kendall, MD;  Location: [facility] INVASIVE CV LAB;  Service: Cardiovascular;  Laterality: N/A;   DILATION AND CURETTAGE OF UTERUS     Hip replacement right     x6   LEFT HEART CATH AND CORONARY ANGIOGRAPHY N/A 08/17/2023   Procedure: LEFT HEART CATH AND CORONARY ANGIOGRAPHY;  Surgeon: Yvonne Kendall, MD;  Location: [facility] INVASIVE CV LAB;  Service: Cardiovascular;  Laterality: N/A;     Social History:   reports that she has never smoked. She does not have any smokeless tobacco history on file. She reports that she does not drink alcohol and does not use drugs.   Family History:  Her family history includes Aneurysm in an other family member.   Allergies Allergies  Allergen Reactions   Clindamycin/Lincomycin Anaphylaxis    Rash,swelling of face and neck   Zolpidem Other (See Comments)     Home Medications  Prior to Admission medications   Medication Sig Start Date End Date Taking? Authorizing Provider  losartan (COZAAR) 50 MG tablet Take 50 mg by mouth daily.   Yes [provider]  Oxycodone HCl 10 MG TABS Take 1 tablet by mouth 4 (four) times daily as needed (pain).   Yes [provider]  traMADol (ULTRAM-ER) 200 MG 24 hr tablet Take 200 mg by mouth at bedtime.   Yes [provider]  gabapentin (NEURONTIN) 600 MG  tablet Take 1,200 mg by mouth 2 (two) times daily. 04/23/21   [provider]  tiZANidine (ZANAFLEX) 4 MG tablet Take 4 mg by mouth 3 (three) times daily. 03/20/22   [provider]     Critical care time: 60 minutes     Zada Girt, AGNP  Pulmonary/Critical Care Pager [phone] (please enter 7 digits) PCCM Consult Pager [phone] (please enter 7 digits)

## 2023-08-20 DIAGNOSIS — I952 Hypotension due to drugs: Secondary | ICD-10-CM | POA: Diagnosis not present

## 2023-08-20 DIAGNOSIS — I2102 ST elevation (STEMI) myocardial infarction involving left anterior descending coronary artery: Secondary | ICD-10-CM | POA: Diagnosis not present

## 2023-08-20 DIAGNOSIS — N179 Acute kidney failure, unspecified: Secondary | ICD-10-CM | POA: Diagnosis not present

## 2023-08-20 DIAGNOSIS — G894 Chronic pain syndrome: Secondary | ICD-10-CM

## 2023-08-20 DIAGNOSIS — I2699 Other pulmonary embolism without acute cor pulmonale: Secondary | ICD-10-CM | POA: Diagnosis not present

## 2023-08-20 LAB — CBC
HCT: 35.6 % — ABNORMAL LOW (ref 36.0–46.0)
Hemoglobin: 11.8 g/dL — ABNORMAL LOW (ref 12.0–15.0)
MCH: 30 pg (ref 26.0–34.0)
MCHC: 33.1 g/dL (ref 30.0–36.0)
MCV: 90.6 fL (ref 80.0–100.0)
Platelets: 202 10*3/uL (ref 150–400)
RBC: 3.93 MIL/uL (ref 3.87–5.11)
RDW: 12.8 % (ref 11.5–15.5)
WBC: 11.1 10*3/uL — ABNORMAL HIGH (ref 4.0–10.5)
nRBC: 0 % (ref 0.0–0.2)

## 2023-08-20 LAB — MAGNESIUM: Magnesium: 2.1 mg/dL (ref 1.7–2.4)

## 2023-08-20 LAB — BASIC METABOLIC PANEL
Anion gap: 5 (ref 5–15)
BUN: 34 mg/dL — ABNORMAL HIGH (ref 8–23)
CO2: 25 mmol/L (ref 22–32)
Calcium: 8.6 mg/dL — ABNORMAL LOW (ref 8.9–10.3)
Chloride: 106 mmol/L (ref 98–111)
Creatinine, Ser: 0.9 mg/dL (ref 0.44–1.00)
GFR, Estimated: >60 mL/min (ref >60)
Glucose, Bld: 123 mg/dL — ABNORMAL HIGH (ref 70–99)
Potassium: 4.2 mmol/L (ref 3.5–5.1)
Sodium: 136 mmol/L (ref 135–145)

## 2023-08-20 LAB — ANTITHROMBIN III: AntiThromb III Func: 76 % (ref 75–120)

## 2023-08-20 LAB — PHOSPHORUS: Phosphorus: 4.2 mg/dL (ref 2.5–4.6)

## 2023-08-20 MED ORDER — TIZANIDINE HCL 2 MG PO TABS
2.0000 mg | ORAL_TABLET | Freq: Three times a day (TID) | ORAL | Status: DC | PRN
  Administered 2023-08-20 (×2): 2 mg via ORAL
  Filled 2023-08-20 (×2): qty 1

## 2023-08-20 MED ORDER — OXYCODONE HCL 5 MG PO TABS: 7.5000 mg | ORAL_TABLET | Freq: Four times a day (QID) | ORAL | Status: DC | PRN

## 2023-08-20 MED ORDER — OXYCODONE HCL 5 MG PO TABS
5.0000 mg | ORAL_TABLET | Freq: Once | ORAL | Status: AC
  Administered 2023-08-20: 5 mg via ORAL

## 2023-08-20 MED ORDER — OXYCODONE HCL 5 MG PO TABS
5.0000 mg | ORAL_TABLET | ORAL | Status: DC | PRN
  Administered 2023-08-20: 5 mg via ORAL
  Filled 2023-08-20: qty 1

## 2023-08-20 MED ORDER — LIDOCAINE 5 % EX PTCH
1.0000 | MEDICATED_PATCH | CUTANEOUS | Status: DC
  Administered 2023-08-20: 1 via TRANSDERMAL
  Filled 2023-08-20 (×2): qty 1

## 2023-08-20 MED ORDER — TIZANIDINE HCL 4 MG PO TABS
4.0000 mg | ORAL_TABLET | Freq: Once | ORAL | Status: AC
  Administered 2023-08-20: 4 mg via ORAL
  Filled 2023-08-20: qty 1

## 2023-08-20 MED ORDER — MIDODRINE HCL 5 MG PO TABS
10.0000 mg | ORAL_TABLET | Freq: Three times a day (TID) | ORAL | Status: DC
  Administered 2023-08-20 (×2): 10 mg via ORAL
  Filled 2023-08-20 (×2): qty 2

## 2023-08-20 MED ORDER — MIDODRINE HCL 5 MG PO TABS
5.0000 mg | ORAL_TABLET | Freq: Three times a day (TID) | ORAL | Status: DC
  Administered 2023-08-20: 5 mg via ORAL
  Filled 2023-08-20: qty 1

## 2023-08-20 MED ORDER — OXYCODONE HCL 5 MG PO TABS
5.0000 mg | ORAL_TABLET | ORAL | Status: DC | PRN
  Administered 2023-08-20 – 2023-08-21 (×3): 5 mg via ORAL
  Filled 2023-08-20 (×4): qty 1

## 2023-08-20 MED ORDER — ONDANSETRON 4 MG PO TBDP: 4.0000 mg | ORAL_TABLET | Freq: Three times a day (TID) | ORAL | Status: DC | PRN

## 2023-08-20 MED ORDER — OXYCODONE HCL 5 MG PO TABS
7.5000 mg | ORAL_TABLET | ORAL | Status: DC | PRN
  Administered 2023-08-20: 7.5 mg via ORAL
  Filled 2023-08-20: qty 2

## 2023-08-20 MED ORDER — MIDODRINE HCL 5 MG PO TABS
5.0000 mg | ORAL_TABLET | Freq: Once | ORAL | Status: AC
Start: 2023-08-20 — End: 2023-08-20
  Administered 2023-08-20: 5 mg via ORAL
  Filled 2023-08-20: qty 1

## 2023-08-20 MED ORDER — MIDODRINE HCL 5 MG PO TABS: 5.0000 mg | ORAL_TABLET | Freq: Three times a day (TID) | ORAL | Status: DC

## 2023-08-20 NOTE — Plan of Care (Signed)
  Problem: Education: Goal: Knowledge of General Education information will improve Description: Including pain rating scale, medication(s)/side effects and non-pharmacologic comfort measures Outcome: Progressing   Problem: Health Behavior/Discharge Planning: Goal: Ability to manage health-related needs will improve Outcome: Progressing   Problem: Clinical Measurements: Goal: Ability to maintain clinical measurements within normal limits will improve Outcome: Progressing   Problem: Clinical Measurements: Goal: Cardiovascular complication will be avoided Outcome: Progressing   Problem: Activity: Goal: Risk for activity intolerance will decrease Outcome: Progressing   Problem: Pain Management: Goal: General experience of comfort will improve Outcome: Progressing   Problem: Safety: Goal: Ability to remain free from injury will improve Outcome: Progressing

## 2023-08-20 NOTE — Progress Notes (Signed)
NAME:  Alicia Moyer, MRN:  [id number], DOB:  02/04/1961, LOS: 3 ADMISSION DATE:  08/17/2023, CONSULTATION DATE: 08/19/2023 REFERRING MD: Dr. Mariah Milling, CHIEF COMPLAINT: Hypotension    History of Present Illness:  This is a 62 yo female who presented to [facility] ER on 11/11 from home via EMS with c/o chest pain radiating down both arms, nausea, and shortness of breath.  Upon EMS arrival on the scene they administered 324 mg of aspirin, 1 spray of nitroglycerin, and 50 mcg of fentanyl.  Code STEMI initiated by EMS.  ED Course  In the ER initial and repeat EKG concerning for ST elevation in lead II, III, and avF.  Cardiologist Dr. Okey Dupre evaluated pt and pt transferred emergently to the cardiac cath lab.   Cardiac cath findings were severe single-vessel CAD with acute plaque rupture in the proximal LAD leading to 70% stenosis in the proximal LAD and occlusion of the apical LAD from embolized thrombus with successful OCT-guided PCT to the proximal LAD using drug-eluting stent.  She was subsequently admitted to the stepdown unit post cardiac cath for additional workup and treatment per hospitalist team.  See detailed hospital course below for additional workup and treatment.    Cardiac Cath: severe single-vessel coronary artery disease with acute plaque rupture in the proximal LAD leading to 70% stenosis in the proximal LAD and occlusion of the apical LAD from embolized thrombus.  No angiographically significant CAD noted in the LCx or RCA. Preserved left ventricular systolic function (LVEF 55-65%) with apical akinesis. Normal left ventricular filling pressure (LVEDP 10 mmHg). Successful OCT-guided PCI to the proximal LAD using Onyx Frontier 4.0 x 15 mm drug-eluting stent with 0% residual stenosis and TIMI-2 flow from improving microvascular dysfunction. Cardiology recommendations dual antiplatelet therapy with aspirin and prasugrel for at least 12 months. Aggressive secondary prevention of coronary artery disease,  including high-intensity statin therapy  Pertinent  Medical History  Arthrititis  Chronic Pain  Fibromyalgia  HTN Scoliosis  HFpEF   Significant Hospital Events: Including procedures, antibiotic start and stop dates in addition to other pertinent events   11/11: Pt admitted to the stepdown unit with anterior STEMI s/p PCI to the         proximal LAD.  CTA Chest PE revealed right upper lobe segmental and         subsegmental  pulmonary emboli. No pulmonary infarction or right heart strain. 11/13: Pt transferred to ICU with hypotension despite 1L IV fluid bolus requiring peripheral levophed gtt.  EKG obtained showing nonspecific ST elevation in lead III, T wave inversions 2 3 aVF, anterolateral leads V4 5 6 Cardiology evaluated pt at bedside.  PCCM team consulted to assist with management  11/14: Pt on and off levophed overnight.  She becomes hypotensive after receiving pain medication.  She is currently off of levophed gtt and maintain map 65 or higher.  PCCM team will sign off   Interim History / Subjective:  Pt currently complaining of severe pain and is requesting her outpatient pain medication regimen to be restarted.  She is also concerned about going into withdrawal.    Objective   Blood pressure 122/72, pulse (!) 106, temperature 98.1 F (36.7 C), temperature source Oral, resp. rate 16, height 5\' 4"  (1.626 m), weight 70.4 kg, SpO2 99%.        Intake/Output Summary (Last 24 hours) at 08/20/2023 0749 Last data filed at 08/20/2023 0649 Gross per 24 hour  Intake 1424.67 ml  Output 1000 ml  Net  424.67 ml   Filed Weights   08/17/23 1803  Weight: 70.4 kg    Examination: General: Acutely-ill appearing female, NAD  HENT: Supple, no JVD  Lungs: Clear throughout, even, non labored  Cardiovascular: Sinus bradycardia, s1s2, no m/r/g, 2+radial/2+ distal pulses, no edema  Abdomen: +BS x4, soft, non tender, non distended  Extremities: Normal bulk and tone, moves all extremities   Neuro: Alert and oriented, following commands, PERRLA  GU: Voiding   Resolved Hospital Problem list     Assessment & Plan:   #Chronic pain syndrome - Hospitalist adjusting pain medications   #Anterior STEMI s/p PCI to proximal LAD  #HFpEF  - Continuous telemetry monitoring  - Continue apixaban and clopidogrel  - Troponin trending down  - Cardiology consulted appreciate input: recommending dual antiplatelet therapy with aspirin and prasugrel for at least 12 months and aggressive secondary prevention of coronary artery disease, including high-intensity statin therapy  #Hypotension suspect secondary to beta-blocker therapy and pain medications   Hx: HTN  - Pt no longer requiring levophed gtt; recommend starting midodrine  - Hold beta-blockers and outpatient antihypertensives for now  - Lactic acid 1.7  #RUL pulmonary emboli~(possibly provoked she states she had a long car ride the first week in Nov) Venous US Bilateral Lower Extremity 08/17/23: No evidence of deep venous thrombosis in either lower extremity - Continue apixaban  - Hypercoagulable panel pending   - Supplemental O2 for dyspnea and/or hypoxia  - Maintain O2 sats 92% or higher   #Anemia without obvious acute blood loss  - Trend CBC  - Monitor for s/sx of bleeding  - Transfuse for hgb less than 8  Best Practice (right click and "Reselect all SmartList Selections" daily)   Diet/type: Regular consistency (see orders) DVT prophylaxis: Apixaban  GI prophylaxis: PPI Lines: N/A Foley:  N/A Code Status:  full code Last date of multidisciplinary goals of care discussion [N/A]  11/13: Updated pt regarding current plan of care and all questions answered Labs   CBC: Recent Labs  Lab 08/18/23 0412 08/19/23 0152 08/19/23 1326 08/19/23 1710 08/20/23 0503  WBC 15.1* 10.8* 10.3 10.1 11.1*  HGB 13.9 13.1 11.7* 11.7* 11.8*  HCT 41.2 39.6 35.4* 35.6* 35.6*  MCV 88.0 89.6 92.2 90.6 90.6  PLT 271 209 189 204 202     Basic Metabolic Panel: Recent Labs  Lab 08/17/23 1800 08/17/23 2138 08/18/23 0412 08/19/23 0152 08/19/23 1326 08/20/23 0503  NA 135  --  134* 137 134* 136  K 4.6  --  4.9 4.1 4.4 4.2  CL 105  --  105 104 106 106  CO2 21*  --  24 25 23 25   GLUCOSE 145*  --  103* 104* 122* 123*  BUN 44*  --  44* 31* 32* 34*  CREATININE 1.23* 0.92 0.98 0.84 1.10* 0.90  CALCIUM 8.9  --  8.5* 8.3* 8.1* 8.6*  MG  --   --   --   --   --  2.1  PHOS  --   --   --   --   --  4.2   GFR: Estimated Creatinine Clearance: 62.4 mL/min (by C-G formula based on SCr of 0.9 mg/dL). Recent Labs  Lab 08/19/23 0152 08/19/23 1326 08/19/23 1332 08/19/23 1710 08/20/23 0503  PROCALCITON  --   --  <0.10  --   --   WBC 10.8* 10.3  --  10.1 11.1*  LATICACIDVEN  --  1.7  --   --   --  Liver Function Tests: Recent Labs  Lab 08/17/23 2317  AST 39  ALT 25  ALKPHOS 72  BILITOT 0.6  PROT 6.0*  ALBUMIN 3.1*   No results for input(s): "LIPASE", "AMYLASE" in the last 168 hours. No results for input(s): "AMMONIA" in the last 168 hours.  ABG No results found for: "PHART", "PCO2ART", "PO2ART", "HCO3", "TCO2", "ACIDBASEDEF", "O2SAT"   Coagulation Profile: Recent Labs  Lab 08/17/23 2138  INR 1.3*    Cardiac Enzymes: No results for input(s): "CKTOTAL", "CKMB", "CKMBINDEX", "TROPONINI" in the last 168 hours.  HbA1C: Hgb A1c MFr Bld  Date/Time Value Ref Range Status  08/17/2023 09:38 PM 5.4 4.8 - 5.6 % Final    Comment:    (NOTE) Pre diabetes:          5.7%-6.4%  Diabetes:              >6.4%  Glycemic control for   <7.0% adults with diabetes     CBG: Recent Labs  Lab 08/17/23 2005  GLUCAP 120*    Review of Systems: Positives in BOLD   Gen: Denies fever, chills, weight change, fatigue, night sweats HEENT: Denies blurred vision, double vision, hearing loss, tinnitus, sinus congestion, rhinorrhea, sore throat, neck stiffness, dysphagia PULM: Denies shortness of breath, cough, sputum  production, hemoptysis, wheezing CV: Denies chest pain, edema, orthopnea, paroxysmal nocturnal dyspnea, palpitations GI: Denies abdominal pain, nausea, vomiting, diarrhea, hematochezia, melena, constipation, change in bowel habits GU: Denies dysuria, hematuria, polyuria, oliguria, urethral discharge Endocrine: Denies hot or cold intolerance, polyuria, polyphagia or appetite change Derm: Denies rash, dry skin, scaling or peeling skin change Heme: Denies easy bruising, bleeding, bleeding gums Neuro: chronic pain, headache, numbness, weakness, slurred speech, loss of memory or consciousness  Past Medical History:  She,  has a past medical history of Arthritis, CHF (congestive heart failure) (HCC), Chronic pain, CSF abnormal (07/15/2014), Fibromyalgia (07/15/2014), Hypertension, and Scoliosis.   Surgical History:   Past Surgical History:  Procedure Laterality Date   ABDOMINAL HYSTERECTOMY  2000   CORONARY IMAGING/OCT N/A 08/17/2023   Procedure: CORONARY IMAGING/OCT;  Surgeon: Yvonne Kendall, MD;  Location: [facility] INVASIVE CV LAB;  Service: Cardiovascular;  Laterality: N/A;   CORONARY/GRAFT ACUTE MI REVASCULARIZATION N/A 08/17/2023   Procedure: Coronary/Graft Acute MI Revascularization;  Surgeon: Yvonne Kendall, MD;  Location: [facility] INVASIVE CV LAB;  Service: Cardiovascular;  Laterality: N/A;   DILATION AND CURETTAGE OF UTERUS     Hip replacement right     x6   LEFT HEART CATH AND CORONARY ANGIOGRAPHY N/A 08/17/2023   Procedure: LEFT HEART CATH AND CORONARY ANGIOGRAPHY;  Surgeon: Yvonne Kendall, MD;  Location: [facility] INVASIVE CV LAB;  Service: Cardiovascular;  Laterality: N/A;     Social History:   reports that she has never smoked. She does not have any smokeless tobacco history on file. She reports that she does not drink alcohol and does not use drugs.   Family History:  Her family history includes Aneurysm in an other family member.   Allergies Allergies  Allergen Reactions    Clindamycin/Lincomycin Anaphylaxis    Rash,swelling of face and neck   Zolpidem Other (See Comments)     Home Medications  Prior to Admission medications   Medication Sig Start Date End Date Taking? Authorizing Provider  losartan (COZAAR) 50 MG tablet Take 50 mg by mouth daily.   Yes [provider]  Oxycodone HCl 10 MG TABS Take 1 tablet by mouth 4 (four) times daily as needed (pain).   Yes [provider]  traMADol (ULTRAM-ER) 200 MG 24 hr tablet Take 200 mg by mouth at bedtime.   Yes [provider]  gabapentin (NEURONTIN) 600 MG tablet Take 1,200 mg by mouth 2 (two) times daily. 04/23/21   [provider]  tiZANidine (ZANAFLEX) 4 MG tablet Take 4 mg by mouth 3 (three) times daily. 03/20/22   [provider]     Critical care time: 35 minutes     Zada Girt, AGNP  Pulmonary/Critical Care Pager [phone] (please enter 7 digits) PCCM Consult Pager [phone] (please enter 7 digits)

## 2023-08-20 NOTE — Plan of Care (Signed)
  Problem: Education: Goal: Knowledge of General Education information will improve Description: Including pain rating scale, medication(s)/side effects and non-pharmacologic comfort measures Outcome: Progressing   Problem: Clinical Measurements: Goal: Ability to maintain clinical measurements within normal limits will improve Outcome: Progressing Goal: Diagnostic test results will improve Outcome: Progressing Goal: Respiratory complications will improve Outcome: Progressing Goal: Cardiovascular complication will be avoided Outcome: Progressing   Problem: Activity: Goal: Risk for activity intolerance will decrease Outcome: Progressing   Problem: Pain Management: Goal: General experience of comfort will improve Outcome: Progressing   Problem: Safety: Goal: Ability to remain free from injury will improve Outcome: Progressing   Problem: Skin Integrity: Goal: Risk for impaired skin integrity will decrease Outcome: Progressing   Problem: Education: Goal: Understanding of CV disease, CV risk reduction, and recovery process will improve Outcome: Progressing   Problem: Activity: Goal: Ability to return to baseline activity level will improve Outcome: Progressing   Problem: Cardiovascular: Goal: Ability to achieve and maintain adequate cardiovascular perfusion will improve Outcome: Progressing

## 2023-08-20 NOTE — Progress Notes (Signed)
Physical Therapy Evaluation Patient Details Name: MINAKSHI NORDINE MRN: [id number] DOB: 11/06/1960 Today's Date: 08/20/2023  History of Present Illness  Zakari Marotte is a 62 y.o. female with a h/o HFpEF, HTN, and chronic pain, admitted on 08/19/2023 w/ acute anterior STEMI s/p prox LAD stent (residual apical LAD occlusion), w/ subsequent finding of right upper lobe segmental and subsegmental pulmonary emboli. Developed hypotension 11/13 in setting of pain meds and carvedilol.   Clinical Impression  Orders Received. Chart Reviewed. Patient in recliner upon arrival, agreeable to PT evaluation this date. PTA, patient was Mod I with Mobility and ADLs with use of L L[place]strand Crutch. Patient lives in multi level home with spouse/son, with main bedroom upstairs. Patient require close supv/CGA with transfers and ambulation. Patient able to ambulate 160 ft without use of AD this date. Patient will benefit from skilled acute PT services to address functional impairments (see below for additional) and maximize functional mobility to return to PLOF. Anticipate the need for follow up PT services upon acute hospital discharge. Will continue to follow acutely.         If plan is discharge home, recommend the following: A little help with walking and/or transfers;Help with stairs or ramp for entrance   Can travel by private vehicle        Equipment Recommendations None recommended by PT  Recommendations for Other Services       Functional Status Assessment Patient has had a recent decline in their functional status and demonstrates the ability to make significant improvements in function in a reasonable and predictable amount of time.     Precautions / Restrictions Precautions Precautions: Fall Restrictions Weight Bearing Restrictions: No      Mobility  Bed Mobility               General bed mobility comments: not tested; recieved and left in recliner    Transfers Overall transfer level:  Needs assistance Equipment used: None Transfers: Sit to/from Stand Sit to Stand: Supervision           General transfer comment: pt able to stand with supervision from recliner. Denies lightheadedness. Vitals stable.    Ambulation/Gait Ambulation/Gait assistance: Supervision, Contact guard assist Gait Distance (Feet): 160 Feet Assistive device: None   Gait velocity: Decreased     General Gait Details: Pt with abnormal gait pattern at baseline d/t history of RLE leg length discrepancy. Patient able to tolerate ambulation 160 ft without use of AD, with close supervision/CGA.  Stairs Stairs:  (did not trial this date; will benefit from trial in future session)          Wheelchair Mobility     Tilt Bed    Modified Rankin (Stroke Patients Only)       Balance Overall balance assessment: Needs assistance Sitting-balance support: No upper extremity supported, Feet supported Sitting balance-Leahy Scale: Good     Standing balance support: No upper extremity supported, During functional activity Standing balance-Leahy Scale: Fair                               Pertinent Vitals/Pain Pain Assessment Pain Assessment: 0-10 Pain Score: 7  Pain Location: back Pain Descriptors / Indicators: Grimacing, Guarding, Contraction Pain Intervention(s): Limited activity within patient's tolerance, Monitored during session, Patient requesting pain meds-RN notified    Home Living Family/patient expects to be discharged to:: Private residence Living Arrangements: Spouse/significant other;Children Available Help at Discharge: Family;Available 24 hours/day  Type of Home: House Home Access: Stairs to enter Entrance Stairs-Rails: Left Entrance Stairs-Number of Steps: 3 Alternate Level Stairs-Number of Steps: 14 Home Layout: Two level;Bed/bath upstairs Home Equipment: Crutches;Shower seat Additional Comments: can sleep in recliner on 1st floor if needed    Prior Function  Prior Level of Function : Independent/Modified Independent             Mobility Comments: L lofstrand crutch for community mobility or no AD in home       Extremity/Trunk Assessment   Upper Extremity Assessment Upper Extremity Assessment: Overall WFL for tasks assessed    Lower Extremity Assessment Lower Extremity Assessment: RLE deficits/detail RLE Deficits / Details: reports baseline leg length discrepancy with extensive surgical history of R Hip, no special orthotic/lift use reported       Communication   Communication Communication: No apparent difficulties  Cognition Arousal: Alert Behavior During Therapy: WFL for tasks assessed/performed Overall Cognitive Status: Within Functional Limits for tasks assessed                                          General Comments      Exercises     Assessment/Plan    PT Assessment Patient needs continued PT services  PT Problem List Decreased strength;Decreased activity tolerance;Decreased balance;Decreased mobility;Pain       PT Treatment Interventions DME instruction;Gait training;Stair training;Functional mobility training;Therapeutic activities;Balance training    PT Goals (Current goals can be found in the Care Plan section)  Acute Rehab PT Goals Patient Stated Goal: Get Home PT Goal Formulation: With patient Time For Goal Achievement: 09/03/23 Potential to Achieve Goals: Good    Frequency Min 1X/week     Co-evaluation   Reason for Co-Treatment: For patient/therapist safety;To address functional/ADL transfers PT goals addressed during session: Mobility/safety with mobility OT goals addressed during session: ADL's and self-care       AM-PAC PT "6 Clicks" Mobility  Outcome Measure Help needed turning from your back to your side while in a flat bed without using bedrails?: None Help needed moving from lying on your back to sitting on the side of a flat bed without using bedrails?: A  Little Help needed moving to and from a bed to a chair (including a wheelchair)?: A Little Help needed standing up from a chair using your arms (e.g., wheelchair or bedside chair)?: A Little Help needed to walk in hospital room?: A Little Help needed climbing 3-5 steps with a railing? : A Little 6 Click Score: 19    End of Session Equipment Utilized During Treatment: Gait belt Activity Tolerance: Patient tolerated treatment well Patient left: in chair;with call bell/phone within reach Nurse Communication: Mobility status;Patient requests pain meds PT Visit Diagnosis: Unsteadiness on feet (R26.81)    Time: 1350-1411 PT Time Calculation (min) (ACUTE ONLY): 21 min   Charges:   PT Evaluation $PT Eval Low Complexity: 1 Low   PT General Charges $$ ACUTE PT VISIT: 1 Visit         Howie Ill, PT, DPT 08/20/23 3:19 PM

## 2023-08-20 NOTE — Progress Notes (Signed)
PROGRESS NOTE    [name]   ZYS:[id number] DOB: Feb 04, 1961  DOA: 08/17/2023 Date of Service: 08/20/23 which is hospital day 3  PCP: Alicia Goodell, MD    HPI: Alicia Moyer is a 62 y.o. female with a history of arthritis, repeated right hip surgeries and chronic pain who presented to the ED on 08/17/2023 with abrupt central chest pain radiating to both arms, arriving as code STEMI   Hospital course / significant events:  confirmed to have severe LAD disease with acute proximal plaque rupture and apical embolized thrombus for which proximal LAD DES was placed. CTA chest also demonstrated RUL pulmonary embolism. She remains in ICU on heparin infusion, DAPT without hypoxemia, having clinically stabilized.  11/13 significant hypotension, levophed initiated, w/u thus far nonrevelaing likely medication effect, echo done - preserved EF  11/14: wean off levophed early AM. Hold coreg. Increase midodrine.   Consultants:  Cardiology   Procedures/Surgeries: 11/11: cardaic cath w/ PCI to LAD - Dr End       ASSESSMENT & PLAN:   STEMI: s/I LAD PCI 11/11 by Dr. Okey Dupre.  CAD No symptoms of angina or cardiac decompensation  With incidentally noted PE, she was transitioned from Effient to Plavix with a 600 mg loading dose on 11/12, followed by Plavix 75 mg daily thereafter to start on 11/13 triple therapy with ASA 81 mg daily, Plavix 75 mg daily, and Eliquis (PE dosing of 10 mg bid x 7 days then 5 mg bid thereafter) for 1 month.  At that time, ASA should stopped and she should remain on Eliquis and Plavix.  Once she has completed therapy of Eliquis, ASA should be resumed with continuation of Plavix to complete 12 months of DAPT Add Protonix 20 mg given triple therapy  Echo pending read Lipitor 40 mg, LDL 53 this admission, LP(a) pending Coreg 3.125 mg bid - holding parameters d/t low BP Cardiac rehab Aggressive risk factor modification and secondary prevention  Post cath  instructions  Hypotension Concern for opiate / beta blocker / combination medication effect, see note 11/13 hold Coreg D/c or reduced pain medications - d/c tramadol, reduced dose/frequency oxycodone, reduced gabapentin, reduced tizanidine  Cardiology also following Continue midodrine   Chronic HFpEF, ICM:  - Echo pending, though LVEF wnl and filling pressure appeared ok during cath.  Continue ARB.  No indication for diuresis at this time.    RUL pulmonary embolism:  No evidence of R heart strain by CT or by exam.  No DVT on LE venous U/S.  Echocardiogram pending read.  Eliquis 10 mg bid x 7 days then 5 mg bid thereafter for 3-6 months  Query unprovoked PE, may benefit from hematology evaluation as an outpatient for hypercoagulable work up  Monitor CBC   NSVT: No further episodes Asymptomatic Brief runs Holding Coreg as above Potassium at goal   HTN: Blood pressure mildly elevated Now on Coreg as above  AKI: SCr 1.23 on admission from baseline suspected to be normal (0.7-0.9 based on remote values). SCr has improved.  Avoid nephrotoxins, renally dose medications.  Monitor BMP     Overweight based on BMI: Body mass index is 26.64 kg/m.  Underweight - under 18.5  normal weight - 18.5 to 24.9 overweight - 25 to 29.9 obese - 30 or more   DVT prophylaxis: eliquis IV fluids: no continuous IV fluids, s/p boluses, now on norepi in setting of IV fluids shortage Nutrition: cardaic diet  Central lines / invasive devices: none  Code Status: FULL CODE ACP documentation reviewed: 08/19/23 and none on file in [facility]  TOC needs: TBD Barriers to dispo / significant pending items: hypotensive              Subjective / Brief ROS:  Patient reports feeling better today w/ regard to no weakness/dizziness, she reports back pain is pretty severe though  Denies CP/SOB.  Denies new weakness.    Family Communication: none at this time will call w/ updates      Objective Findings:  Vitals:   08/20/23 1345 08/20/23 1400 08/20/23 1415 08/20/23 1430  BP: 109/74 (!) 152/116 107/66 121/74  Pulse: (!) 59 (!) 101 79 61  Resp: (!) 24 18 20 17   Temp:      TempSrc:      SpO2: 100% 99% 99% 97%  Weight:      Height:        Intake/Output Summary (Last 24 hours) at 08/20/2023 1508 Last data filed at 08/20/2023 1448 Gross per 24 hour  Intake 628.13 ml  Output 1150 ml  Net -521.87 ml   Filed Weights   08/17/23 1803  Weight: 70.4 kg    Examination:  Physical Exam Constitutional:      General: She is not in acute distress.    Appearance: She is not ill-appearing.     Comments: Pale this morning, sleepy but easily awoken   HENT:     Mouth/Throat:     Mouth: Mucous membranes are dry.  Cardiovascular:     Rate and Rhythm: Regular rhythm. Bradycardia present.  Pulmonary:     Effort: Pulmonary effort is normal.     Breath sounds: Normal breath sounds.  Abdominal:     General: Abdomen is flat. Bowel sounds are normal.     Palpations: Abdomen is soft.  Musculoskeletal:     Right lower leg: No edema.     Left lower leg: No edema.  Skin:    General: Skin is warm and dry.  Neurological:     General: No focal deficit present.     Mental Status: She is alert and oriented to person, place, and time.  Psychiatric:        Mood and Affect: Mood normal.        Behavior: Behavior normal.          Scheduled Medications:   apixaban  10 mg Oral BID   Followed by   Melene Muller ON 08/26/2023] apixaban  5 mg Oral BID   aspirin  81 mg Oral Daily   atorvastatin  40 mg Oral Daily   Chlorhexidine Gluconate Cloth  6 each Topical Q0600   clopidogrel  75 mg Oral Daily   gabapentin  600 mg Oral BID   midodrine  10 mg Oral TID WC   pantoprazole  20 mg Oral Daily   sodium chloride flush  3 mL Intravenous Q12H    Continuous Infusions:    PRN Medications:  acetaminophen, ondansetron (ZOFRAN) IV, ondansetron, mouth rinse, oxyCODONE, sodium  chloride flush, tiZANidine  Antimicrobials from admission:  Anti-infectives (From admission, onward)    None           Data Reviewed:  I have personally reviewed the following...  CBC: Recent Labs  Lab 08/18/23 0412 08/19/23 0152 08/19/23 1326 08/19/23 1710 08/20/23 0503  WBC 15.1* 10.8* 10.3 10.1 11.1*  HGB 13.9 13.1 11.7* 11.7* 11.8*  HCT 41.2 39.6 35.4* 35.6* 35.6*  MCV 88.0 89.6 92.2 90.6 90.6  PLT 271 209 189  204 202   Basic Metabolic Panel: Recent Labs  Lab 08/17/23 1800 08/17/23 2138 08/18/23 0412 08/19/23 0152 08/19/23 1326 08/20/23 0503  NA 135  --  134* 137 134* 136  K 4.6  --  4.9 4.1 4.4 4.2  CL 105  --  105 104 106 106  CO2 21*  --  24 25 23 25   GLUCOSE 145*  --  103* 104* 122* 123*  BUN 44*  --  44* 31* 32* 34*  CREATININE 1.23* 0.92 0.98 0.84 1.10* 0.90  CALCIUM 8.9  --  8.5* 8.3* 8.1* 8.6*  MG  --   --   --   --   --  2.1  PHOS  --   --   --   --   --  4.2   GFR: Estimated Creatinine Clearance: 62.4 mL/min (by C-G formula based on SCr of 0.9 mg/dL). Liver Function Tests: Recent Labs  Lab 08/17/23 2317  AST 39  ALT 25  ALKPHOS 72  BILITOT 0.6  PROT 6.0*  ALBUMIN 3.1*   No results for input(s): "LIPASE", "AMYLASE" in the last 168 hours. No results for input(s): "AMMONIA" in the last 168 hours. Coagulation Profile: Recent Labs  Lab 08/17/23 2138  INR 1.3*   Cardiac Enzymes: No results for input(s): "CKTOTAL", "CKMB", "CKMBINDEX", "TROPONINI" in the last 168 hours. BNP (last 3 results) No results for input(s): "PROBNP" in the last 8760 hours. HbA1C: Recent Labs    08/17/23 2138  HGBA1C 5.4   CBG: Recent Labs  Lab 08/17/23 2005  GLUCAP 120*   Lipid Profile: Recent Labs    08/17/23 2138  CHOL 116  HDL 52  LDLCALC 53  TRIG 54  CHOLHDL 2.2   Thyroid Function Tests: No results for input(s): "TSH", "T4TOTAL", "FREET4", "T3FREE", "THYROIDAB" in the last 72 hours. Anemia Panel: No results for input(s):  "VITAMINB12", "FOLATE", "FERRITIN", "TIBC", "IRON", "RETICCTPCT" in the last 72 hours. Most Recent Urinalysis On File:     Component Value Date/Time   COLORURINE YELLOW (A) 12/25/2021 1833   APPEARANCEUR HAZY (A) 12/25/2021 1833   LABSPEC 1.014 12/25/2021 1833   PHURINE 5.0 12/25/2021 1833   GLUCOSEU NEGATIVE 12/25/2021 1833   HGBUR LARGE (A) 12/25/2021 1833   BILIRUBINUR NEGATIVE 12/25/2021 1833   KETONESUR NEGATIVE 12/25/2021 1833   PROTEINUR NEGATIVE 12/25/2021 1833   NITRITE NEGATIVE 12/25/2021 1833   LEUKOCYTESUR MODERATE (A) 12/25/2021 1833   Sepsis Labs: @LABRCNTIP (procalcitonin:4,lacticidven:4) Microbiology: Recent Results (from the past 240 hour(s))  MRSA Next Gen by PCR, Nasal     Status: None   Collection Time: 08/17/23  8:15 PM   Specimen: Nasal Mucosa; Nasal Swab  Result Value Ref Range Status   MRSA by PCR Next Gen NOT DETECTED NOT DETECTED Final    Comment: (NOTE) The GeneXpert MRSA Assay (FDA approved for NASAL specimens only), is one component of a comprehensive MRSA colonization surveillance program. It is not intended to diagnose MRSA infection nor to guide or monitor treatment for MRSA infections. Test performance is not FDA approved in patients less than 55 years old. Performed at [facility], [address] [place], [place] [address]       Radiology Studies last 3 days: ECHOCARDIOGRAM COMPLETE  Result Date: 08/19/2023    ECHOCARDIOGRAM REPORT   Patient Name:   Alicia Moyer Date of Exam: 08/19/2023 Medical Rec #:  [id number]    Height:       64.0 in Accession #:    [id number]  Weight:       155.2 lb Date of Birth:  [birth year]    BSA:          1.756 m Patient Age:    62 years     BP:           97/65 mmHg Patient Gender: F            HR:           48 bpm. Exam Location:  [facility] Procedure: 2D Echo, 3D Echo, Cardiac Doppler, Color Doppler and Strain Analysis Indications:     Acute MI  History:         Patient has prior history of Echocardiogram  examinations, most                  recent 01/08/2022. CHF, Acute MI; Risk Factors:Hypertension.                  Pulmonary embolus.  Sonographer:     Mikki Harbor Referring Phys:  [phone] CHRISTOPHER END Diagnosing Phys: Julien Nordmann MD  Sonographer Comments: Global longitudinal strain was attempted. IMPRESSIONS  1. Left ventricular ejection fraction, by estimation, is 60 to 65%. The left ventricle has normal function. The left ventricle has no regional wall motion abnormalities. Left ventricular diastolic parameters are consistent with Grade I diastolic dysfunction (impaired relaxation). The average left ventricular global longitudinal strain is -11.6 %.  2. Right ventricular systolic function is normal. The right ventricular size is normal. There is normal pulmonary artery systolic pressure. The estimated right ventricular systolic pressure is 32.4 mmHg.  3. The mitral valve is normal in structure. Mild to moderate mitral valve regurgitation. No evidence of mitral stenosis.  4. Tricuspid valve regurgitation is mild to moderate.  5. The aortic valve is tricuspid. Aortic valve regurgitation is not visualized. No aortic stenosis is present.  6. The inferior vena cava is normal in size with greater than 50% respiratory variability, suggesting right atrial pressure of 3 mmHg. FINDINGS  Left Ventricle: Left ventricular ejection fraction, by estimation, is 60 to 65%. The left ventricle has normal function. The left ventricle has no regional wall motion abnormalities. The average left ventricular global longitudinal strain is -11.6 %. The left ventricular internal cavity size was normal in size. There is no left ventricular hypertrophy. Left ventricular diastolic parameters are consistent with Grade I diastolic dysfunction (impaired relaxation). Right Ventricle: The right ventricular size is normal. No increase in right ventricular wall thickness. Right ventricular systolic function is normal. There is normal pulmonary  artery systolic pressure. The tricuspid regurgitant velocity is 2.47 m/s, and  with an assumed right atrial pressure of 8 mmHg, the estimated right ventricular systolic pressure is 32.4 mmHg. Left Atrium: Left atrial size was normal in size. Right Atrium: Right atrial size was normal in size. Pericardium: There is no evidence of pericardial effusion. Mitral Valve: The mitral valve is normal in structure. Mild to moderate mitral valve regurgitation. No evidence of mitral valve stenosis. MV peak gradient, 2.6 mmHg. The mean mitral valve gradient is 1.0 mmHg. Tricuspid Valve: The tricuspid valve is normal in structure. Tricuspid valve regurgitation is mild to moderate. No evidence of tricuspid stenosis. Aortic Valve: The aortic valve is tricuspid. Aortic valve regurgitation is not visualized. No aortic stenosis is present. Aortic valve mean gradient measures 2.0 mmHg. Aortic valve peak gradient measures 4.4 mmHg. Aortic valve area, by VTI measures 2.19 cm. Pulmonic Valve: The pulmonic valve was normal in structure. Pulmonic valve regurgitation  is not visualized. No evidence of pulmonic stenosis. Aorta: The aortic root is normal in size and structure. Venous: The inferior vena cava is normal in size with greater than 50% respiratory variability, suggesting right atrial pressure of 3 mmHg. IAS/Shunts: No atrial level shunt detected by color flow Doppler.  LEFT VENTRICLE PLAX 2D LVIDd:         4.40 cm     Diastology LVIDs:         2.60 cm     LV e' medial:    6.31 cm/s LV PW:         1.20 cm     LV E/e' medial:  10.5 LV IVS:        1.00 cm     LV e' lateral:   6.85 cm/s LVOT diam:     1.90 cm     LV E/e' lateral: 9.6 LV SV:         59 LV SV Index:   34          2D Longitudinal Strain LVOT Area:     2.84 cm    2D Strain GLS Avg:     -11.6 %  LV Volumes (MOD) LV vol d, MOD A2C: 38.0 ml LV vol d, MOD A4C: 44.6 ml LV vol s, MOD A2C: 13.4 ml LV vol s, MOD A4C: 21.8 ml LV SV MOD A2C:     24.6 ml LV SV MOD A4C:     44.6 ml LV  SV MOD BP:      23.2 ml RIGHT VENTRICLE RV Basal diam:  3.40 cm RV Mid diam:    3.40 cm RV S prime:     14.10 cm/s LEFT ATRIUM           Index        RIGHT ATRIUM           Index LA diam:      4.00 cm 2.28 cm/m   RA Area:     14.90 cm LA Vol (A2C): 28.3 ml 16.11 ml/m  RA Volume:   38.10 ml  21.69 ml/m LA Vol (A4C): 50.6 ml 28.81 ml/m  AORTIC VALVE                    PULMONIC VALVE AV Area (Vmax):    2.48 cm     PV Vmax:       0.69 m/s AV Area (Vmean):   2.36 cm     PV Peak grad:  1.9 mmHg AV Area (VTI):     2.19 cm AV Vmax:           105.00 cm/s AV Vmean:          70.500 cm/s AV VTI:            0.269 m AV Peak Grad:      4.4 mmHg AV Mean Grad:      2.0 mmHg LVOT Vmax:         91.80 cm/s LVOT Vmean:        58.800 cm/s LVOT VTI:          0.208 m LVOT/AV VTI ratio: 0.77  AORTA Ao Root diam: 3.30 cm MITRAL VALVE               TRICUSPID VALVE MV Area (PHT): 4.21 cm    TR Peak grad:   24.4 mmHg MV Area VTI:   1.59 cm    TR Vmax:  247.00 cm/s MV Peak grad:  2.6 mmHg MV Mean grad:  1.0 mmHg    SHUNTS MV Vmax:       0.80 m/s    Systemic VTI:  0.21 m MV Vmean:      43.6 cm/s   Systemic Diam: 1.90 cm MV Decel Time: 180 msec MV E velocity: 66.00 cm/s MV A velocity: 57.60 cm/s MV E/A ratio:  1.15 Julien Nordmann MD Electronically signed by Julien Nordmann MD Signature Date/Time: 08/19/2023/4:48:15 PM    Final    US Venous Img Lower Bilateral (DVT)  Result Date: 08/18/2023 CLINICAL DATA:  Bilateral lower extremity edema. EXAM: BILATERAL LOWER EXTREMITY VENOUS DOPPLER ULTRASOUND TECHNIQUE: Gray-scale sonography with graded compression, as well as color Doppler and duplex ultrasound were performed to evaluate the lower extremity deep venous systems from the level of the common femoral vein and including the common femoral, femoral, profunda femoral, popliteal and calf veins including the posterior tibial, peroneal and gastrocnemius veins when visible. The superficial great saphenous vein was also interrogated.  Spectral Doppler was utilized to evaluate flow at rest and with distal augmentation maneuvers in the common femoral, femoral and popliteal veins. COMPARISON:  December 25, 2021 FINDINGS: RIGHT LOWER EXTREMITY Common Femoral Vein: No evidence of thrombus. Normal compressibility, respiratory phasicity and response to augmentation. Saphenofemoral Junction: No evidence of thrombus. Normal compressibility and flow on color Doppler imaging. Profunda Femoral Vein: No evidence of thrombus. Normal compressibility and flow on color Doppler imaging. Femoral Vein: No evidence of thrombus. Normal compressibility, respiratory phasicity and response to augmentation. Popliteal Vein: No evidence of thrombus. Normal compressibility, respiratory phasicity and response to augmentation. Calf Veins: No evidence of thrombus. Normal compressibility and flow on color Doppler imaging. Superficial Great Saphenous Vein: No evidence of thrombus. Normal compressibility. Venous Reflux:  None. Other Findings:  None. LEFT LOWER EXTREMITY Common Femoral Vein: No evidence of thrombus. Normal compressibility, respiratory phasicity and response to augmentation. Saphenofemoral Junction: No evidence of thrombus. Normal compressibility and flow on color Doppler imaging. Profunda Femoral Vein: No evidence of thrombus. Normal compressibility and flow on color Doppler imaging. Femoral Vein: No evidence of thrombus. Normal compressibility, respiratory phasicity and response to augmentation. Popliteal Vein: No evidence of thrombus. Normal compressibility, respiratory phasicity and response to augmentation. Calf Veins: No evidence of thrombus. Normal compressibility and flow on color Doppler imaging. Superficial Great Saphenous Vein: No evidence of thrombus. Normal compressibility. Venous Reflux:  None. Other Findings:  None. IMPRESSION: No evidence of deep venous thrombosis in either lower extremity. Electronically Signed   By: Aram Candela M.D.   On:  08/18/2023 04:44   CT Angio Chest Pulmonary Embolism (PE) W or WO Contrast  Result Date: 08/17/2023 CLINICAL DATA:  Pulmonary embolism (PE) suspected, high prob. Dizzy, chest pain, sob, lower extremity swelling. Hip surgery EXAM: CT ANGIOGRAPHY CHEST WITH CONTRAST TECHNIQUE: Multidetector CT imaging of the chest was performed using the standard protocol during bolus administration of intravenous contrast. Multiplanar CT image reconstructions and MIPs were obtained to evaluate the vascular anatomy. RADIATION DOSE REDUCTION: This exam was performed according to the departmental dose-optimization program which includes automated exposure control, adjustment of the mA and/or kV according to patient size and/or use of iterative reconstruction technique. CONTRAST:  75mL OMNIPAQUE IOHEXOL 350 MG/ML SOLN COMPARISON:  None Available. FINDINGS: Cardiovascular: Satisfactory opacification of the pulmonary arteries to the segmental level. Right upper lobe segmental and subsegmental pulmonary emboli. Normal heart size. No significant pericardial effusion. The thoracic aorta is normal in caliber. Mild atherosclerotic  plaque of the thoracic aorta. Left anterior descending coronary artery stent. Tortuous descending thoracic aorta. Mediastinum/Nodes: Small hiatal hernia. No enlarged mediastinal, hilar, or axillary lymph nodes. Thyroid gland, trachea, and esophagus demonstrate no significant findings. Lungs/Pleura: No focal consolidation. No pulmonary nodule. No pulmonary mass. No pleural effusion. No pneumothorax. Upper Abdomen: No acute abnormality. Musculoskeletal: No chest wall abnormality.  Bilateral breast implants. No suspicious lytic or blastic osseous lesions. No acute displaced fracture. Multilevel degenerative changes of the spine. Chronic T9 vertebral body height loss. Review of the MIP images confirms the above findings. IMPRESSION: 1. Right upper lobe segmental and subsegmental pulmonary emboli. No pulmonary  infarction or right heart strain. 2.  Small hiatal hernia. These results were called by telephone at the time of interpretation on 08/17/2023 at 11:27 pm to provider EKTA PATEL , who verbally acknowledged these results. Electronically Signed   By: Tish Frederickson M.D.   On: 08/17/2023 23:30   CARDIAC CATHETERIZATION  Result Date: 08/17/2023 Conclusions: Severe single-vessel coronary artery disease with acute plaque rupture in the proximal LAD leading to 70% stenosis in the proximal LAD and occlusion of the apical LAD from embolized thrombus.  No angiographically significant CAD noted in the LCx or RCA. Preserved left ventricular systolic function (LVEF 55-65%) with apical akinesis. Normal left ventricular filling pressure (LVEDP 10 mmHg). Successful OCT-guided PCI to the proximal LAD using Onyx Frontier 4.0 x 15 mm drug-eluting stent with 0% residual stenosis and TIMI-2 flow from improving microvascular dysfunction. Recommendations: Admit to ICU/stepdown for post-STEMI care. Continue tirofiban infusion for 6 hours. Dual antiplatelet therapy with aspirin and prasugrel for at least 12 months. Aggressive secondary prevention of coronary artery disease, including high-intensity statin therapy. Gentle post-catheterization hydration. Yvonne Kendall, MD [facility]      Time spent: 50 min    Sunnie Nielsen, DO [facility] 08/20/2023, 3:08 PM    Dictation software may have been used to generate the above note. Typos may occur and escape review in typed/dictated notes. Please contact Dr Lyn Hollingshead directly for clarity if needed.  Staff may message me via secure chat in Epic  but this may not receive an immediate response,  please page me for urgent matters!  If 7PM-7AM, please contact night coverage [email or web address]

## 2023-08-20 NOTE — Progress Notes (Signed)
Pain uncontrolled. Both Dr. Lyn Hollingshead and Zada Girt NP made aware. Patient started on po midodrine and tizanidine 2 mg prn for muscle spasms.

## 2023-08-20 NOTE — Evaluation (Signed)
Occupational Therapy Evaluation Patient Details Name: Alicia Moyer MRN: [id number] DOB: Apr 23, 1961 Today's Date: 08/20/2023   History of Present Illness Alicia Moyer is a 62 y.o. female with a h/o HFpEF, HTN, and chronic pain, admitted on 08/19/2023 w/ acute anterior STEMI s/p prox LAD stent (residual apical LAD occlusion), w/ subsequent finding of right upper lobe segmental and subsegmental pulmonary emboli. Developed hypotension 11/13 in setting of pain meds and carvedilol.   Clinical Impression   [name] was seen for OT evaluation this date. Prior to hospital admission, pt was MOD I using L lofstrand crutch as needed. Pt lives with spouse and son in 2 level home, bedroom upstairs. Pt currently requires SBA for toilet t/f, pericare, and hand washing standing sink side. Pt would benefit from skilled OT to address noted impairments and functional limitations (see below for any additional details). Upon hospital discharge, recommend no OT follow up.    If plan is discharge home, recommend the following: Help with stairs or ramp for entrance    Functional Status Assessment  Patient has had a recent decline in their functional status and demonstrates the ability to make significant improvements in function in a reasonable and predictable amount of time.  Equipment Recommendations  None recommended by OT    Recommendations for Other Services       Precautions / Restrictions Precautions Precautions: Fall Restrictions Weight Bearing Restrictions: No      Mobility Bed Mobility               General bed mobility comments: not tested    Transfers Overall transfer level: Needs assistance Equipment used: None Transfers: Sit to/from Stand Sit to Stand: Supervision                  Balance Overall balance assessment: Needs assistance Sitting-balance support: No upper extremity supported, Feet supported Sitting balance-Leahy Scale: Good     Standing balance support:  No upper extremity supported, During functional activity Standing balance-Leahy Scale: Fair                             ADL either performed or assessed with clinical judgement   ADL Overall ADL's : Needs assistance/impaired                                       General ADL Comments: SBA for toilet t/f and standing hand washing.      Pertinent Vitals/Pain Pain Assessment Pain Assessment: 0-10 Pain Score: 7  Pain Location: back Pain Descriptors / Indicators: Grimacing, Guarding, Contraction Pain Intervention(s): Limited activity within patient's tolerance, Repositioned, Patient requesting pain meds-RN notified     Extremity/Trunk Assessment Upper Extremity Assessment Upper Extremity Assessment: Overall WFL for tasks assessed   Lower Extremity Assessment Lower Extremity Assessment: RLE deficits/detail RLE Deficits / Details: reports baseline leg length discrepancy ~4.5 inches causing abnormal gait pattern       Communication Communication Communication: No apparent difficulties   Cognition Arousal: Alert Behavior During Therapy: WFL for tasks assessed/performed Overall Cognitive Status: Within Functional Limits for tasks assessed  Home Living Family/patient expects to be discharged to:: Private residence Living Arrangements: Spouse/significant other;Children Available Help at Discharge: Family;Available 24 hours/day Type of Home: House Home Access: Stairs to enter [employer] of Steps: 3 Entrance Stairs-Rails: Left Home Layout: Two level;Bed/bath upstairs Alternate Level Stairs-Number of Steps: 14 Alternate Level Stairs-Rails: Left Bathroom Shower/Tub: Chief Strategy Officer: Handicapped height     Home Equipment: Crutches;Shower seat   Additional Comments: can sleep in recliner on 1st floor      Prior Functioning/Environment Prior Level  of Function : Independent/Modified Independent             Mobility Comments: L lofstrand crutch for community mobility or no AD in home          OT Problem List: Impaired balance (sitting and/or standing);Decreased activity tolerance      OT Treatment/Interventions: Self-care/ADL training;Therapeutic exercise;Energy conservation;DME and/or AE instruction;Therapeutic activities;Patient/family education;Balance training    OT Goals(Current goals can be found in the care plan section) Acute Rehab OT Goals Patient Stated Goal: to go home OT Goal Formulation: With patient Time For Goal Achievement: 09/03/23 Potential to Achieve Goals: Good ADL Goals Pt Will Perform Grooming: Independently;standing Pt Will Perform Lower Body Dressing: Independently;sit to/from stand Pt Will Transfer to Toilet: Independently;ambulating;regular height toilet  OT Frequency: Min 1X/week    Co-evaluation PT/OT/SLP Co-Evaluation/Treatment: Yes Reason for Co-Treatment: For patient/therapist safety;To address functional/ADL transfers PT goals addressed during session: Mobility/safety with mobility OT goals addressed during session: ADL's and self-care      AM-PAC OT "6 Clicks" Daily Activity     Outcome Measure Help from another person eating meals?: None Help from another person taking care of personal grooming?: A Little Help from another person toileting, which includes using toliet, bedpan, or urinal?: A Little Help from another person bathing (including washing, rinsing, drying)?: A Little Help from another person to put on and taking off regular upper body clothing?: None Help from another person to put on and taking off regular lower body clothing?: A Little 6 Click Score: 20   End of Session Equipment Utilized During Treatment: Gait belt Nurse Communication: Mobility status;Patient requests pain meds  Activity Tolerance: Patient tolerated treatment well Patient left: in chair;with call  bell/phone within reach  OT Visit Diagnosis: Other abnormalities of gait and mobility (R26.89);Muscle weakness (generalized) (M62.81)                Time: 3875-6433 OT Time Calculation (min): 20 min Charges:  OT General Charges $OT Visit: 1 Visit OT Evaluation $OT Eval Moderate Complexity: 1 Mod  Kathie Dike, M.S. OTR/L  08/20/23, 2:51 PM  ascom [phone]

## 2023-08-20 NOTE — Progress Notes (Signed)
Cardiology Progress Note   Patient Name: Alicia Moyer Date of Encounter: 08/20/2023  Primary Cardiologist: Yvonne Kendall, MD  Subjective   No chest pain or dyspnea this AM.  Developed hypotension 11/13 in the setting of multiple pain meds and ? blocker therapy.  Pain meds adjusted, carvedilol on hold.  Nsg notes that when pt dozes off, BPs drop.  Initially required pressors, subsequently weaned, now on midodrine.  Objective   Inpatient Medications    Scheduled Meds:  apixaban  10 mg Oral BID   Followed by   Melene Muller ON 08/26/2023] apixaban  5 mg Oral BID   aspirin  81 mg Oral Daily   atorvastatin  40 mg Oral Daily   Chlorhexidine Gluconate Cloth  6 each Topical Q0600   clopidogrel  75 mg Oral Daily   gabapentin  600 mg Oral BID   lidocaine  1 patch Transdermal Once   midodrine  5 mg Oral TID WC   pantoprazole  20 mg Oral Daily   sodium chloride flush  3 mL Intravenous Q12H   Continuous Infusions:  PRN Meds: acetaminophen, ondansetron (ZOFRAN) IV, mouth rinse, oxyCODONE, sodium chloride flush, tiZANidine   Vital Signs    Vitals:   08/20/23 0830 08/20/23 0845 08/20/23 0900 08/20/23 0915  BP: 115/74 (!) 82/56 (!) 90/59 (!) 76/52  Pulse: 70 71 65 (!) 47  Resp: 20 18 (!) 23 16  Temp:      TempSrc:      SpO2: 100% 100% 96% 94%  Weight:      Height:        Intake/Output Summary (Last 24 hours) at 08/20/2023 0921 Last data filed at 08/20/2023 0649 Gross per 24 hour  Intake 1424.67 ml  Output 1000 ml  Net 424.67 ml   Filed Weights   08/17/23 1803  Weight: 70.4 kg    Physical Exam   GEN: Well nourished, well developed, in no acute distress.  HEENT: Grossly normal.  Neck: Supple, no JVD, carotid bruits, or masses. Cardiac: RRR, no murmurs, rubs, or gallops. No clubbing, cyanosis, edema.  Radials 2+, DP/PT 2+ and equal bilaterally.  Respiratory:  Respirations regular and unlabored, clear to auscultation bilaterally. GI: Soft, nontender, nondistended, BS + x  4. MS: no deformity or atrophy. Skin: warm and dry, no rash. Neuro:  Strength and sensation are intact. Psych: AAOx3.  Normal affect.  Labs    Chemistry Recent Labs  Lab 08/17/23 2317 08/18/23 0412 08/19/23 0152 08/19/23 1326 08/20/23 0503  NA  --    < > 137 134* 136  K  --    < > 4.1 4.4 4.2  CL  --    < > 104 106 106  CO2  --    < > 25 23 25   GLUCOSE  --    < > 104* 122* 123*  BUN  --    < > 31* 32* 34*  CREATININE  --    < > 0.84 1.10* 0.90  CALCIUM  --    < > 8.3* 8.1* 8.6*  PROT 6.0*  --   --   --   --   ALBUMIN 3.1*  --   --   --   --   AST 39  --   --   --   --   ALT 25  --   --   --   --   ALKPHOS 72  --   --   --   --  BILITOT 0.6  --   --   --   --   GFRNONAA  --    < > >60 57* >60  ANIONGAP  --    < > 8 5 5    < > = values in this interval not displayed.     Hematology Recent Labs  Lab 08/19/23 1326 08/19/23 1710 08/20/23 0503  WBC 10.3 10.1 11.1*  RBC 3.84* 3.93 3.93  HGB 11.7* 11.7* 11.8*  HCT 35.4* 35.6* 35.6*  MCV 92.2 90.6 90.6  MCH 30.5 29.8 30.0  MCHC 33.1 32.9 33.1  RDW 13.1 12.8 12.8  PLT 189 204 202    Cardiac Enzymes  Recent Labs  Lab 08/17/23 1800 08/17/23 2138 08/18/23 0412 08/19/23 1326  TROPONINIHS 71* 2,384* 10,831* 3,433*      BNP    Component Value Date/Time   BNP 194.2 (H) 12/25/2021 1833    Lipids  Lab Results  Component Value Date   CHOL 116 08/17/2023   HDL 52 08/17/2023   LDLCALC 53 08/17/2023   TRIG 54 08/17/2023   CHOLHDL 2.2 08/17/2023    HbA1c  Lab Results  Component Value Date   HGBA1C 5.4 08/17/2023    Radiology    US Venous Img Lower Bilateral (DVT)  Result Date: 08/18/2023 CLINICAL DATA:  Bilateral lower extremity edema. EXAM: BILATERAL LOWER EXTREMITY VENOUS DOPPLER ULTRASOUND TECHNIQUE: IMPRESSION: No evidence of deep venous thrombosis in either lower extremity. Electronically Signed   By: Aram Candela M.D.   On: 08/18/2023 04:44   CT Angio Chest Pulmonary Embolism (PE) W or WO  Contrast  Result Date: 08/17/2023 CLINICAL DATA:  Pulmonary embolism (PE) suspected, high prob. Dizzy, chest pain, sob, lower extremity swelling. Hip surgery EXAM: CT ANGIOGRAPHY CHEST WITH CONTRAST TECHNIQUE:  IMPRESSION: 1. Right upper lobe segmental and subsegmental pulmonary emboli. No pulmonary infarction or right heart strain. 2.  Small hiatal hernia. These results were called by telephone at the time of interpretation on 08/17/2023 at 11:27 pm to provider EKTA PATEL , who verbally acknowledged these results. Electronically Signed   By: Tish Frederickson M.D.   On: 08/17/2023 23:30    Telemetry    RSR - Personally Reviewed  Cardiac Studies   Cardiac Catheterization and Percutaneous Coronary Intervention 11.11.2024  Diagnostic Dominance: Right  Intervention - The proximal LAD was successfully treated using a 4.0 x 15 Onyx Frontier DES.   LVEDP 10 mmHg. The left ventricular ejection fraction is 55-65% by visual estimate. There are LV function abnormalities. _____________  2D Echocardiogram 11.11.2024   1. Left ventricular ejection fraction, by estimation, is 60 to 65%. The  left ventricle has normal function. The left ventricle has no regional  wall motion abnormalities. Left ventricular diastolic parameters are  consistent with Grade I diastolic  dysfunction (impaired relaxation). The average left ventricular global  longitudinal strain is -11.6 %.   2. Right ventricular systolic function is normal. The right ventricular  size is normal. There is normal pulmonary artery systolic pressure. The  estimated right ventricular systolic pressure is 32.4 mmHg.   3. The mitral valve is normal in structure. Mild to moderate mitral valve  regurgitation. No evidence of mitral stenosis.   4. Tricuspid valve regurgitation is mild to moderate.   5. The aortic valve is tricuspid. Aortic valve regurgitation is not  visualized. No aortic stenosis is present.   6. The inferior vena cava is  normal in size with greater than 50%  respiratory variability, suggesting right atrial pressure of  3 mmHg.  _____________   Patient Profile     62 y.o. female w/a h/o HFpEF, HTN, and chronic pain, admitted 08/19/2023 w/ acute anterior STEMI s/p prox LAD stent (residual apical LAD occlusion), w/ subsequent finding of right upper lobe segmental and subsegmental pulmonary emboli.  Developed hypotension 11/13 in setting of pain meds and carvedilol.  Assessment & Plan    1.  Acute anterior STEMI/CAD:  Admitted w/ chest pain and ant ST elevation on 11/13. Found to have apical LAD embolus and 70% prox LAD dzs  S/p DES to the pLAD.  Hs[place]rop [place] to 10831.  Nl LV fxn by echo.  No c/p or dyspnea.  ? blocker now on hold 2/2 hypotension 11/13.  Cont asa x 30 days (in setting of eliquis for PE), plavix, statin.  2.  RUL PE:  Noted on CT post-cath.  No c/p, dyspnea, hypoxia.  Currently on eliquis 10 bid x 7 days  5 bid.  3.  Hypotension:  In setting pain meds and ? blocker.  Now off pressors, though pressures cont to drop into 70's.  We will increase midodrine to 10 mg TID.  Cont to hold ? blocker.  Pain med adjustments per primary team.  4.  NSVT:  Quiescent overnight.  ? blocker on hold in setting of hypotension.  5.  HL:  LDL 53.  LFTs nl. Cont atorva 40.  Signed, Nicolasa Ducking, NP  08/20/2023, 9:21 AM    For questions or updates, please contact   Please consult [email or web address] for contact info under Cardiology/STEMI.

## 2023-08-21 DIAGNOSIS — I2102 ST elevation (STEMI) myocardial infarction involving left anterior descending coronary artery: Secondary | ICD-10-CM | POA: Diagnosis not present

## 2023-08-21 LAB — CBC
HCT: 30.5 % — ABNORMAL LOW (ref 36.0–46.0)
Hemoglobin: 10.2 g/dL — ABNORMAL LOW (ref 12.0–15.0)
MCH: 30.3 pg (ref 26.0–34.0)
MCHC: 33.4 g/dL (ref 30.0–36.0)
MCV: 90.5 fL (ref 80.0–100.0)
Platelets: 125 10*3/uL — ABNORMAL LOW (ref 150–400)
RBC: 3.37 MIL/uL — ABNORMAL LOW (ref 3.87–5.11)
RDW: 12.9 % (ref 11.5–15.5)
WBC: 7.9 10*3/uL (ref 4.0–10.5)
nRBC: 0 % (ref 0.0–0.2)

## 2023-08-21 LAB — LUPUS ANTICOAGULANT PANEL
DRVVT: 45 s (ref 0.0–47.0)
PTT Lupus Anticoagulant: 33.5 s (ref 0.0–43.5)

## 2023-08-21 LAB — BASIC METABOLIC PANEL
Anion gap: 6 (ref 5–15)
BUN: 26 mg/dL — ABNORMAL HIGH (ref 8–23)
CO2: 25 mmol/L (ref 22–32)
Calcium: 8.5 mg/dL — ABNORMAL LOW (ref 8.9–10.3)
Chloride: 107 mmol/L (ref 98–111)
Creatinine, Ser: 0.84 mg/dL (ref 0.44–1.00)
GFR, Estimated: >60 mL/min (ref >60)
Glucose, Bld: 107 mg/dL — ABNORMAL HIGH (ref 70–99)
Potassium: 4 mmol/L (ref 3.5–5.1)
Sodium: 138 mmol/L (ref 135–145)

## 2023-08-21 LAB — BETA-2-GLYCOPROTEIN I ABS, IGG/M/A
Beta-2 Glyco I IgG: <9 GPI IgG units (ref 0–20)
Beta-2-Glycoprotein I IgA: <9 GPI IgA units (ref 0–25)
Beta-2-Glycoprotein I IgM: <9 GPI IgM units (ref 0–32)

## 2023-08-21 LAB — CARDIOLIPIN ANTIBODIES, IGG, IGM, IGA
Anticardiolipin IgA: <9 U/mL (ref 0–11)
Anticardiolipin IgG: <9 [GPL'U]/mL (ref 0–14)
Anticardiolipin IgM: 12 [MPL'U]/mL (ref 0–12)

## 2023-08-21 LAB — PROTEIN C ACTIVITY: Protein C Activity: 108 % (ref 73–180)

## 2023-08-21 LAB — HOMOCYSTEINE: Homocysteine: 13.7 umol/L (ref 0.0–17.2)

## 2023-08-21 LAB — PROTEIN S ACTIVITY: Protein S Activity: 48 % — ABNORMAL LOW (ref 63–140)

## 2023-08-21 LAB — PROTEIN S, TOTAL: Protein S Ag, Total: 44 % — ABNORMAL LOW (ref 60–150)

## 2023-08-21 MED ORDER — MIDODRINE HCL 5 MG PO TABS
5.0000 mg | ORAL_TABLET | Freq: Three times a day (TID) | ORAL | Status: DC
  Administered 2023-08-21 (×2): 5 mg via ORAL
  Filled 2023-08-21 (×2): qty 1

## 2023-08-21 MED ORDER — TIZANIDINE HCL 4 MG PO TABS: 2.0000 mg | ORAL_TABLET | Freq: Three times a day (TID) | ORAL | Status: DC | PRN

## 2023-08-21 MED ORDER — MORPHINE SULFATE (PF) 2 MG/ML IV SOLN: 2.0000 mg | Freq: Once | INTRAVENOUS | Status: DC

## 2023-08-21 MED ORDER — SODIUM CHLORIDE 0.9 % IV BOLUS
500.0000 mL | Freq: Once | INTRAVENOUS | Status: AC
  Administered 2023-08-21: 500 mL via INTRAVENOUS

## 2023-08-21 MED ORDER — OXYCODONE HCL 5 MG PO TABS
10.0000 mg | ORAL_TABLET | ORAL | Status: DC | PRN
  Administered 2023-08-21: 10 mg via ORAL
  Filled 2023-08-21: qty 2

## 2023-08-21 MED ORDER — CLOPIDOGREL BISULFATE 75 MG PO TABS: 75.0000 mg | ORAL_TABLET | Freq: Every day | ORAL | 0 refills | Status: DC

## 2023-08-21 MED ORDER — ATORVASTATIN CALCIUM 40 MG PO TABS: 40.0000 mg | ORAL_TABLET | Freq: Every day | ORAL | 0 refills | Status: DC

## 2023-08-21 MED ORDER — GABAPENTIN 600 MG PO TABS: 600.0000 mg | ORAL_TABLET | Freq: Two times a day (BID) | ORAL | Status: AC

## 2023-08-21 MED ORDER — APIXABAN 5 MG PO TABS: ORAL_TABLET | ORAL | 0 refills | Status: DC

## 2023-08-21 MED ORDER — ASPIRIN 81 MG PO CHEW: 81.0000 mg | CHEWABLE_TABLET | Freq: Every day | ORAL | 0 refills | Status: AC

## 2023-08-21 MED ORDER — OXYCODONE HCL 5 MG PO TABS: 5.0000 mg | ORAL_TABLET | Freq: Once | ORAL | Status: DC

## 2023-08-21 MED ORDER — PANTOPRAZOLE SODIUM 20 MG PO TBEC: 20.0000 mg | DELAYED_RELEASE_TABLET | Freq: Every day | ORAL | 0 refills | Status: AC

## 2023-08-21 NOTE — Progress Notes (Signed)
Discharge paperwork given and reviewed with patient. PIV removed. All questions answered.

## 2023-08-21 NOTE — Progress Notes (Signed)
Patient c/o 12/10 dull, aching, tightness in right back. Patient BP 158/118. Patient seen tearful, moaning and groaning also threatening to leave AMA if "we don't do something about it". Patient tramadol, tizanidine discontinued. Oxycodone and gabapentin dose reduced earlier in the day. Manuela Schwartz, NP notified. Patient received Lidocaine patch, scheduled 5mg  oxycodone, one time dose of 5mg  oxycodone and 4mg  tizanidine ordered. Patient given heat pack and repositioned. 1 hour reassess patient states pain 4/10 and now denies pain. Patient continued to be hypotensive SBP 70's/ 40-50's. Bolus given for low BP, post BP 78/53. Most recent BP 126/80. Patient denies lightheadedness and dizziness, still Alert and oriented x4. Care ongoing.

## 2023-08-21 NOTE — Plan of Care (Signed)
  Problem: Education: Goal: Knowledge of General Education information will improve Description: Including pain rating scale, medication(s)/side effects and non-pharmacologic comfort measures Outcome: Adequate for Discharge   Problem: Health Behavior/Discharge Planning: Goal: Ability to manage health-related needs will improve Outcome: Adequate for Discharge   Problem: Clinical Measurements: Goal: Ability to maintain clinical measurements within normal limits will improve Outcome: Adequate for Discharge Goal: Will remain free from infection Outcome: Adequate for Discharge Goal: Diagnostic test results will improve Outcome: Adequate for Discharge Goal: Respiratory complications will improve Outcome: Adequate for Discharge Goal: Cardiovascular complication will be avoided Outcome: Adequate for Discharge   Problem: Activity: Goal: Risk for activity intolerance will decrease Outcome: Adequate for Discharge   Problem: Nutrition: Goal: Adequate nutrition will be maintained Outcome: Adequate for Discharge   Problem: Coping: Goal: Level of anxiety will decrease Outcome: Adequate for Discharge   Problem: Elimination: Goal: Will not experience complications related to bowel motility Outcome: Adequate for Discharge Goal: Will not experience complications related to urinary retention Outcome: Adequate for Discharge   Problem: Pain Management: Goal: General experience of comfort will improve Outcome: Adequate for Discharge   Problem: Safety: Goal: Ability to remain free from injury will improve Outcome: Adequate for Discharge   Problem: Skin Integrity: Goal: Risk for impaired skin integrity will decrease Outcome: Adequate for Discharge   Problem: Education: Goal: Understanding of CV disease, CV risk reduction, and recovery process will improve Outcome: Adequate for Discharge Goal: Individualized Educational Video(s) Outcome: Adequate for Discharge   Problem:  Activity: Goal: Ability to return to baseline activity level will improve Outcome: Adequate for Discharge   Problem: Cardiovascular: Goal: Ability to achieve and maintain adequate cardiovascular perfusion will improve Outcome: Adequate for Discharge Goal: Vascular access site(s) Level 0-1 will be maintained Outcome: Adequate for Discharge   Problem: Health Behavior/Discharge Planning: Goal: Ability to safely manage health-related needs after discharge will improve Outcome: Adequate for Discharge

## 2023-08-21 NOTE — Care Management Important Message (Signed)
Important Message  Patient Details  Name: Alicia Moyer MRN: [id number] Date of Birth: [birth year]   Important Message Given:  N/A - LOS <3 / Initial given by admissions     Olegario Messier A Duy Lemming 08/21/2023, 12:13 PM

## 2023-08-21 NOTE — TOC Initial Note (Addendum)
Transition of Care [facility]) - Initial/Assessment Note    Patient Details  Name: Alicia Moyer MRN: [id number] Date of Birth: Mar 09, 1961  [facility] CM/SW Contact:    Truddie Hidden, RN Phone Number: 08/21/2023, 2:21 PM  Clinical Narrative:                  Spoke with patient at bedside regarding discharge plan for [facility] PT. Patient is agreeable to [facility] PT and does not have a choice of an agency. Patient advised the accepting agency will contact her directly to scheduled SOC within 48 post discharge.   2:36pm Patient therapy rec downgraded to outpatient therapy. Spoke with patient regarding therapy's recommendation. She would like HH d/t not have transportation.   Referral for [facility] sent to [place] at [place].           Patient Goals and CMS Choice            Expected Discharge Plan and Services                                              Prior Living Arrangements/Services                       Activities of Daily Living      Permission Sought/Granted                  Emotional Assessment              Admission diagnosis:  STEMI involving left anterior descending coronary artery (HCC) [I21.02] Patient Active Problem List   Diagnosis Date Noted   Chronic pain syndrome 08/20/2023   Hypotension due to drugs 08/19/2023   Bradycardia 08/19/2023   Acute pulmonary embolism (HCC) 08/18/2023   ST elevation myocardial infarction (STEMI) (HCC) 08/17/2023   AKI (acute kidney injury) (HCC) 08/17/2023   Stress reaction causing mixed disturbance of emotion and conduct 05/02/2022   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 MA[address] [place] [address] Phone: [phone] Fax: [phone]     Social Determinants of Health (SDOH) Social History: SDOH Screenings   Food Insecurity: No Food Insecurity (06/03/2023)    Received from [facility]  Transportation Needs: No Transportation Needs (06/03/2023)   Received from [facility]  Utilities: Not At Risk (06/03/2023)   Received from [facility]  Depression [phone]): Low Risk  (05/29/2022)  Financial Resource Strain: Low Risk  (06/03/2023)   Received from [facility]  Tobacco Use: Unknown (08/17/2023)   SDOH Interventions:     Readmission Risk Interventions     No data to display

## 2023-08-21 NOTE — Discharge Summary (Signed)
Physician Discharge Summary   Patient: Alicia Moyer MRN: [id number]  DOB: August 26, 1961   Admit:     Date of Admission: 08/17/2023 Admitted from: home   Discharge: Date of discharge: 08/21/23 Disposition: Home Condition at discharge: good  CODE STATUS: FULL CODE     Discharge Physician: Sunnie Nielsen, DO [facility]     PCP: Marina Goodell, MD  Recommendations for Outpatient Follow-up:  Follow up with PCP Marina Goodell, MD in 2-4 weeks Follow up with [facility] Cardiology in 1 week Follow up as directed w/ pain management - discuss tapering down / off medications given profound hypotensive effect following MI PCP AND OTHER OUTPATIENT PROVIDERS: SEE BELOW FOR SPECIFIC DISCHARGE INSTRUCTIONS PRINTED FOR PATIENT IN ADDITION TO GENERIC AVS PATIENT INFO    Discharge Instructions     AMB Referral to Cardiac Rehabilitation - Phase II   Complete by: As directed    Diagnosis:  Coronary Stents STEMI     After initial evaluation and assessments completed: Virtual Based Care may be provided alone or in conjunction with Phase 2 Cardiac Rehab based on patient barriers.: Yes   Intensive Cardiac Rehabilitation (ICR) [facility] location only OR Traditional Cardiac Rehabilitation (TCR) *If criteria for ICR are not met will enroll in TCR [facility] only): Yes   Diet - low sodium heart healthy   Complete by: As directed    Increase activity slowly   Complete by: As directed          Discharge Diagnoses: Principal Problem:   ST elevation myocardial infarction (STEMI) (HCC) Active Problems:   Essential hypertension   AKI (acute kidney injury) (HCC)   Acute pulmonary embolism (HCC)   Hypotension due to drugs   Bradycardia   Chronic pain syndrome       HPI: Alicia Moyer is a 62 y.o. female with a history of arthritis, repeated right hip surgeries and chronic pain who presented to the ED on 08/17/2023 with abrupt central chest pain radiating to both arms, arriving as code  STEMI   Hospital course / significant events:  confirmed to have severe LAD disease with acute proximal plaque rupture and apical embolized thrombus for which proximal LAD DES was placed. CTA chest also demonstrated RUL pulmonary embolism. She remains in ICU on heparin infusion, DAPT without hypoxemia, having clinically stabilized.  11/13 significant hypotension, levophed initiated, w/u thus far nonrevelaing likely medication effect, echo done - preserved EF  11/14: wean off levophed early AM. Hold coreg. 11/15: BP significant improvement, stable for d/c home   Consultants:  Cardiology   Procedures/Surgeries: 11/11: cardaic cath w/ PCI to LAD - Dr End       ASSESSMENT & PLAN:   STEMI: s/I LAD PCI 11/11 by Dr. Okey Dupre.  CAD No symptoms of angina or cardiac decompensation  With incidentally noted PE, she was transitioned from Effient to Plavix with a 600 mg loading dose on 11/12, followed by Plavix 75 mg daily thereafter to start on 11/13 triple therapy with ASA 81 mg daily, Plavix 75 mg daily, and Eliquis (PE dosing of 10 mg bid x 7 days then 5 mg bid thereafter) for 1 month.  After 1 month, ASA should stopped and she should remain on Eliquis and Plavix.  Once she has completed 3-6 months therapy of Eliquis, ASA should be resumed with continuation of Plavix to complete 12 months of DAPT Add Protonix 20 mg given triple therapy  Lipitor 40 mg, LDL 53 this admission, LP(a) pending  Coreg  and losartan held d/t low BP, restart per cardiology/PCP pending BP on follow up  Cardiac rehab Aggressive risk factor modification and secondary prevention  Post cath instructions  Hypotension Concern for opiate / beta blocker / combination medication effect, see note 11/13 Adjusted antihypertensives and pain medications   Chronic HFpEF, ICM:  Coreg  and losartan held d/t low BP, restart per cardiology/PCP pending BP on follow up  No indication for diuresis at this time.    RUL pulmonary embolism:   No evidence of R heart strain by CT or by exam.  No DVT on LE venous U/S.  Eliquis 10 mg bid x 7 days then 5 mg bid thereafter for 3-6 months  Query unprovoked PE, may benefit from hematology evaluation as an outpatient for hypercoagulable work up  Monitor CBC   NSVT: No further episodes Asymptomatic Brief runs Holding Coreg as above Potassium at goal  AKI: resolved  Avoid nephrotoxins Monitor BMP     Overweight based on BMI: Body mass index is 26.64 kg/m.  Underweight - under 18.5  normal weight - 18.5 to 24.9 overweight - 25 to 29.9 obese - 30 or more            Discharge Instructions  Allergies as of 08/21/2023       Reactions   Clindamycin/lincomycin Anaphylaxis   Rash,swelling of face and neck   Zolpidem Other (See Comments)        Medication List     STOP taking these medications    losartan 50 MG tablet Commonly known as: COZAAR   traMADol 200 MG 24 hr tablet Commonly known as: ULTRAM-ER       TAKE these medications    apixaban 5 MG Tabs tablet Commonly known as: ELIQUIS Take 2 tablets (10 mg total) by mouth 2 (two) times daily for 5 days, THEN 1 tablet (5 mg total) 2 (two) times daily for 25 days. Start taking on: August 21, 2023   aspirin 81 MG chewable tablet Chew 1 tablet (81 mg total) by mouth daily. Start taking on: August 22, 2023   atorvastatin 40 MG tablet Commonly known as: LIPITOR Take 1 tablet (40 mg total) by mouth daily. Start taking on: August 22, 2023   clopidogrel 75 MG tablet Commonly known as: PLAVIX Take 1 tablet (75 mg total) by mouth daily. Start taking on: August 22, 2023   gabapentin 600 MG tablet Commonly known as: NEURONTIN Take 1 tablet (600 mg total) by mouth 2 (two) times daily. What changed: how much to take   Oxycodone HCl 10 MG Tabs Take 1 tablet by mouth 4 (four) times daily as needed (pain).   pantoprazole 20 MG tablet Commonly known as: PROTONIX Take 1 tablet (20 mg total)  by mouth daily. Start taking on: August 22, 2023   tiZANidine 4 MG tablet Commonly known as: ZANAFLEX Take 0.5 tablets (2 mg total) by mouth every 8 (eight) hours as needed for muscle spasms. What changed:  how much to take when to take this reasons to take this         Follow-up Information     Feldpausch, Madaline Guthrie, MD. Schedule an appointment as soon as possible for a visit.   Specialty: Family Medicine Why: hospital follow up 2-4 weeks Contact information: [address] [place] [address] [phone]         Yvonne Kendall, MD. Schedule an appointment as soon as possible for a visit.   Specialty: Cardiology Why:  hosptial follow up 1 week Contact information: [address] [place] [address] [phone]                 Allergies  Allergen Reactions   Clindamycin/Lincomycin Anaphylaxis    Rash,swelling of face and neck   Zolpidem Other (See Comments)     Subjective: pt reports no dizziness this morning, ambulating ok, tolerating diet, no CP/SOB   Discharge Exam: BP (!) 169/110 (BP Location: Right Arm)   Pulse 85   Temp 98.7 F (37.1 C) (Oral)   Resp 16   Ht 5\' 4"  (1.626 m)   Wt 70.4 kg   SpO2 100%   BMI 26.64 kg/m  General: Pt is alert, awake, not in acute distress Cardiovascular: RRR, S1/S2 +, no rubs, no gallops Respiratory: CTA bilaterally, no wheezing, no rhonchi Abdominal: Soft, NT, ND, bowel sounds + Extremities: no edema, no cyanosis     The results of significant diagnostics from this hospitalization (including imaging, microbiology, ancillary and laboratory) are listed below for reference.     Microbiology: Recent Results (from the past 240 hour(s))  MRSA Next Gen by PCR, Nasal     Status: None   Collection Time: 08/17/23  8:15 PM   Specimen: Nasal Mucosa; Nasal Swab  Result Value Ref Range Status   MRSA by PCR Next Gen NOT DETECTED NOT DETECTED Final    Comment: (NOTE) The GeneXpert MRSA Assay  (FDA approved for NASAL specimens only), is one component of a comprehensive MRSA colonization surveillance program. It is not intended to diagnose MRSA infection nor to guide or monitor treatment for MRSA infections. Test performance is not FDA approved in patients less than 45 years old. Performed at [facility], [address] [place], [place] [address]      Labs: BNP (last 3 results) No results for input(s): "BNP" in the last 8760 hours. Basic Metabolic Panel: Recent Labs  Lab 08/18/23 0412 08/19/23 0152 08/19/23 1326 08/20/23 0503 08/21/23 0338  NA 134* 137 134* 136 138  K 4.9 4.1 4.4 4.2 4.0  CL 105 104 106 106 107  CO2 24 25 23 25 25   GLUCOSE 103* 104* 122* 123* 107*  BUN 44* 31* 32* 34* 26*  CREATININE 0.98 0.84 1.10* 0.90 0.84  CALCIUM 8.5* 8.3* 8.1* 8.6* 8.5*  MG  --   --   --  2.1  --   PHOS  --   --   --  4.2  --    Liver Function Tests: Recent Labs  Lab 08/17/23 2317  AST 39  ALT 25  ALKPHOS 72  BILITOT 0.6  PROT 6.0*  ALBUMIN 3.1*   No results for input(s): "LIPASE", "AMYLASE" in the last 168 hours. No results for input(s): "AMMONIA" in the last 168 hours. CBC: Recent Labs  Lab 08/19/23 0152 08/19/23 1326 08/19/23 1710 08/20/23 0503 08/21/23 0338  WBC 10.8* 10.3 10.1 11.1* 7.9  HGB 13.1 11.7* 11.7* 11.8* 10.2*  HCT 39.6 35.4* 35.6* 35.6* 30.5*  MCV 89.6 92.2 90.6 90.6 90.5  PLT 209 189 204 202 125*   Cardiac Enzymes: No results for input(s): "CKTOTAL", "CKMB", "CKMBINDEX", "TROPONINI" in the last 168 hours. BNP: Invalid input(s): "POCBNP" CBG: Recent Labs  Lab 08/17/23 2005  GLUCAP 120*   D-Dimer No results for input(s): "DDIMER" in the last 72 hours. Hgb A1c No results for input(s): "HGBA1C" in the last 72 hours. Lipid Profile No results for input(s): "CHOL", "HDL", "LDLCALC", "TRIG", "CHOLHDL", "LDLDIRECT" in  the last 72 hours. Thyroid function studies No results for input(s): "TSH", "T4TOTAL", "T3FREE",  "THYROIDAB" in the last 72 hours.  Invalid input(s): "FREET3" Anemia work up No results for input(s): "VITAMINB12", "FOLATE", "FERRITIN", "TIBC", "IRON", "RETICCTPCT" in the last 72 hours. Urinalysis    Component Value Date/Time   COLORURINE YELLOW (A) 12/25/2021 1833   APPEARANCEUR HAZY (A) 12/25/2021 1833   LABSPEC 1.014 12/25/2021 1833   PHURINE 5.0 12/25/2021 1833   GLUCOSEU NEGATIVE 12/25/2021 1833   HGBUR LARGE (A) 12/25/2021 1833   BILIRUBINUR NEGATIVE 12/25/2021 1833   KETONESUR NEGATIVE 12/25/2021 1833   PROTEINUR NEGATIVE 12/25/2021 1833   NITRITE NEGATIVE 12/25/2021 1833   LEUKOCYTESUR MODERATE (A) 12/25/2021 1833   Sepsis Labs Recent Labs  Lab 08/19/23 1326 08/19/23 1710 08/20/23 0503 08/21/23 0338  WBC 10.3 10.1 11.1* 7.9   Microbiology Recent Results (from the past 240 hour(s))  MRSA Next Gen by PCR, Nasal     Status: None   Collection Time: 08/17/23  8:15 PM   Specimen: Nasal Mucosa; Nasal Swab  Result Value Ref Range Status   MRSA by PCR Next Gen NOT DETECTED NOT DETECTED Final    Comment: (NOTE) The GeneXpert MRSA Assay (FDA approved for NASAL specimens only), is one component of a comprehensive MRSA colonization surveillance program. It is not intended to diagnose MRSA infection nor to guide or monitor treatment for MRSA infections. Test performance is not FDA approved in patients less than 85 years old. Performed at [facility], [address] [place], [place] [address]    Imaging ECHOCARDIOGRAM COMPLETE  Result Date: 08/19/2023    ECHOCARDIOGRAM REPORT   Patient Name:   Alicia Moyer Date of Exam: 08/19/2023 Medical Rec #:  [id number]    Height:       64.0 in Accession #:    [id number]   Weight:       155.2 lb Date of Birth:  04-06-1961    BSA:          1.756 m Patient Age:    62 years     BP:           97/65 mmHg Patient Gender: F            HR:           48 bpm. Exam Location:  [facility] Procedure: 2D Echo, 3D Echo, Cardiac Doppler, Color  Doppler and Strain Analysis Indications:     Acute MI  History:         Patient has prior history of Echocardiogram examinations, most                  recent 01/08/2022. CHF, Acute MI; Risk Factors:Hypertension.                  Pulmonary embolus.  Sonographer:     Mikki Harbor Referring Phys:  [phone] CHRISTOPHER END Diagnosing Phys: Julien Nordmann MD  Sonographer Comments: Global longitudinal strain was attempted. IMPRESSIONS  1. Left ventricular ejection fraction, by estimation, is 60 to 65%. The left ventricle has normal function. The left ventricle has no regional wall motion abnormalities. Left ventricular diastolic parameters are consistent with Grade I diastolic dysfunction (impaired relaxation). The average left ventricular global longitudinal strain is -11.6 %.  2. Right ventricular systolic function is normal. The right ventricular size is normal. There is normal pulmonary artery systolic pressure. The estimated right ventricular systolic pressure is 32.4 mmHg.  3. The mitral valve is normal in structure.  Mild to moderate mitral valve regurgitation. No evidence of mitral stenosis.  4. Tricuspid valve regurgitation is mild to moderate.  5. The aortic valve is tricuspid. Aortic valve regurgitation is not visualized. No aortic stenosis is present.  6. The inferior vena cava is normal in size with greater than 50% respiratory variability, suggesting right atrial pressure of 3 mmHg. FINDINGS  Left Ventricle: Left ventricular ejection fraction, by estimation, is 60 to 65%. The left ventricle has normal function. The left ventricle has no regional wall motion abnormalities. The average left ventricular global longitudinal strain is -11.6 %. The left ventricular internal cavity size was normal in size. There is no left ventricular hypertrophy. Left ventricular diastolic parameters are consistent with Grade I diastolic dysfunction (impaired relaxation). Right Ventricle: The right ventricular size is normal. No  increase in right ventricular wall thickness. Right ventricular systolic function is normal. There is normal pulmonary artery systolic pressure. The tricuspid regurgitant velocity is 2.47 m/s, and  with an assumed right atrial pressure of 8 mmHg, the estimated right ventricular systolic pressure is 32.4 mmHg. Left Atrium: Left atrial size was normal in size. Right Atrium: Right atrial size was normal in size. Pericardium: There is no evidence of pericardial effusion. Mitral Valve: The mitral valve is normal in structure. Mild to moderate mitral valve regurgitation. No evidence of mitral valve stenosis. MV peak gradient, 2.6 mmHg. The mean mitral valve gradient is 1.0 mmHg. Tricuspid Valve: The tricuspid valve is normal in structure. Tricuspid valve regurgitation is mild to moderate. No evidence of tricuspid stenosis. Aortic Valve: The aortic valve is tricuspid. Aortic valve regurgitation is not visualized. No aortic stenosis is present. Aortic valve mean gradient measures 2.0 mmHg. Aortic valve peak gradient measures 4.4 mmHg. Aortic valve area, by VTI measures 2.19 cm. Pulmonic Valve: The pulmonic valve was normal in structure. Pulmonic valve regurgitation is not visualized. No evidence of pulmonic stenosis. Aorta: The aortic root is normal in size and structure. Venous: The inferior vena cava is normal in size with greater than 50% respiratory variability, suggesting right atrial pressure of 3 mmHg. IAS/Shunts: No atrial level shunt detected by color flow Doppler.  LEFT VENTRICLE PLAX 2D LVIDd:         4.40 cm     Diastology LVIDs:         2.60 cm     LV e' medial:    6.31 cm/s LV PW:         1.20 cm     LV E/e' medial:  10.5 LV IVS:        1.00 cm     LV e' lateral:   6.85 cm/s LVOT diam:     1.90 cm     LV E/e' lateral: 9.6 LV SV:         59 LV SV Index:   34          2D Longitudinal Strain LVOT Area:     2.84 cm    2D Strain GLS Avg:     -11.6 %  LV Volumes (MOD) LV vol d, MOD A2C: 38.0 ml LV vol d, MOD A4C:  44.6 ml LV vol s, MOD A2C: 13.4 ml LV vol s, MOD A4C: 21.8 ml LV SV MOD A2C:     24.6 ml LV SV MOD A4C:     44.6 ml LV SV MOD BP:      23.2 ml RIGHT VENTRICLE RV Basal diam:  3.40 cm RV Mid diam:    3.40  cm RV S prime:     14.10 cm/s LEFT ATRIUM           Index        RIGHT ATRIUM           Index LA diam:      4.00 cm 2.28 cm/m   RA Area:     14.90 cm LA Vol (A2C): 28.3 ml 16.11 ml/m  RA Volume:   38.10 ml  21.69 ml/m LA Vol (A4C): 50.6 ml 28.81 ml/m  AORTIC VALVE                    PULMONIC VALVE AV Area (Vmax):    2.48 cm     PV Vmax:       0.69 m/s AV Area (Vmean):   2.36 cm     PV Peak grad:  1.9 mmHg AV Area (VTI):     2.19 cm AV Vmax:           105.00 cm/s AV Vmean:          70.500 cm/s AV VTI:            0.269 m AV Peak Grad:      4.4 mmHg AV Mean Grad:      2.0 mmHg LVOT Vmax:         91.80 cm/s LVOT Vmean:        58.800 cm/s LVOT VTI:          0.208 m LVOT/AV VTI ratio: 0.77  AORTA Ao Root diam: 3.30 cm MITRAL VALVE               TRICUSPID VALVE MV Area (PHT): 4.21 cm    TR Peak grad:   24.4 mmHg MV Area VTI:   1.59 cm    TR Vmax:        247.00 cm/s MV Peak grad:  2.6 mmHg MV Mean grad:  1.0 mmHg    SHUNTS MV Vmax:       0.80 m/s    Systemic VTI:  0.21 m MV Vmean:      43.6 cm/s   Systemic Diam: 1.90 cm MV Decel Time: 180 msec MV E velocity: 66.00 cm/s MV A velocity: 57.60 cm/s MV E/A ratio:  1.15 Julien Nordmann MD Electronically signed by Julien Nordmann MD Signature Date/Time: 08/19/2023/4:48:15 PM    Final       Time coordinating discharge: over 30 minutes  SIGNED:  Sunnie Nielsen DO [facility]

## 2023-08-21 NOTE — Progress Notes (Signed)
Cardiology Progress Note   Patient Name: Alicia Moyer Date of Encounter: 08/21/2023  Primary Cardiologist: Debbe Odea, MD  Subjective   Feels well this AM. BP improved and midodrine dose reduced.  No c/p or dyspnea.  Ambulating.  Eager to go home. Objective   Inpatient Medications    Scheduled Meds:  apixaban  10 mg Oral BID   Followed by   Melene Muller ON 08/26/2023] apixaban  5 mg Oral BID   aspirin  81 mg Oral Daily   atorvastatin  40 mg Oral Daily   Chlorhexidine Gluconate Cloth  6 each Topical Q0600   clopidogrel  75 mg Oral Daily   gabapentin  600 mg Oral BID   lidocaine  1 patch Transdermal Q24H   midodrine  5 mg Oral TID WC   pantoprazole  20 mg Oral Daily   sodium chloride flush  3 mL Intravenous Q12H   Continuous Infusions:  PRN Meds: acetaminophen, ondansetron (ZOFRAN) IV, ondansetron, mouth rinse, oxyCODONE, sodium chloride flush   Vital Signs    Vitals:   08/21/23 0317 08/21/23 0630 08/21/23 0735 08/21/23 1141  BP: 101/76 126/80 (!) 141/90 (!) 147/94  Pulse: (!) 57 83 96 70  Resp: 16  16   Temp: 97.7 F (36.5 C)  98.1 F (36.7 C) 98.1 F (36.7 C)  TempSrc: Oral     SpO2: 94%  100% 100%  Weight:      Height:        Intake/Output Summary (Last 24 hours) at 08/21/2023 1435 Last data filed at 08/21/2023 1058 Gross per 24 hour  Intake 1460 ml  Output 350 ml  Net 1110 ml   Filed Weights   08/17/23 1803  Weight: 70.4 kg    Physical Exam   GEN: Well nourished, well developed, in no acute distress.  HEENT: Grossly normal.  Neck: Supple, no JVD, carotid bruits, or masses. Cardiac: RRR, no murmurs, rubs, or gallops. No clubbing, cyanosis, edema.  Radials 2+, DP/PT 2+ and equal bilaterally.  Respiratory:  Respirations regular and unlabored, clear to auscultation bilaterally. GI: Soft, nontender, nondistended, BS + x 4. MS: no deformity or atrophy. Skin: warm and dry, no rash. Neuro:  Strength and sensation are intact. Psych: AAOx3.  Normal  affect.  Labs    Chemistry Recent Labs  Lab 08/17/23 2317 08/18/23 0412 08/19/23 1326 08/20/23 0503 08/21/23 0338  NA  --    < > 134* 136 138  K  --    < > 4.4 4.2 4.0  CL  --    < > 106 106 107  CO2  --    < > 23 25 25   GLUCOSE  --    < > 122* 123* 107*  BUN  --    < > 32* 34* 26*  CREATININE  --    < > 1.10* 0.90 0.84  CALCIUM  --    < > 8.1* 8.6* 8.5*  PROT 6.0*  --   --   --   --   ALBUMIN 3.1*  --   --   --   --   AST 39  --   --   --   --   ALT 25  --   --   --   --   ALKPHOS 72  --   --   --   --   BILITOT 0.6  --   --   --   --   GFRNONAA  --    < >  57* >60 >60  ANIONGAP  --    < > 5 5 6    < > = values in this interval not displayed.     Hematology Recent Labs  Lab 08/19/23 1710 08/20/23 0503 08/21/23 0338  WBC 10.1 11.1* 7.9  RBC 3.93 3.93 3.37*  HGB 11.7* 11.8* 10.2*  HCT 35.6* 35.6* 30.5*  MCV 90.6 90.6 90.5  MCH 29.8 30.0 30.3  MCHC 32.9 33.1 33.4  RDW 12.8 12.8 12.9  PLT 204 202 125*    Cardiac Enzymes  Recent Labs  Lab 08/17/23 1800 08/17/23 2138 08/18/23 0412 08/19/23 1326  TROPONINIHS 71* 2,384* 10,831* 3,433*      BNP    Component Value Date/Time   BNP 194.2 (H) 12/25/2021 1833   Lipids  Lab Results  Component Value Date   CHOL 116 08/17/2023   HDL 52 08/17/2023   LDLCALC 53 08/17/2023   TRIG 54 08/17/2023   CHOLHDL 2.2 08/17/2023    HbA1c  Lab Results  Component Value Date   HGBA1C 5.4 08/17/2023    Radiology    US Venous Img Lower Bilateral (DVT)  Result Date: 08/18/2023 CLINICAL DATA:  Bilateral lower extremity edema. EXAM: BILATERAL LOWER EXTREMITY VENOUS DOPPLER ULTRASOUND TECHNIQUE: Gray-scale sonography with graded compression, as well as color Doppler and duplex ultrasound were performed to evaluate the lower extremity deep venous systems from the level of the common femoral vein and including the common femoral, femoral, profunda femoral, popliteal and calf veins including the posterior tibial, peroneal and  gastrocnemius veins when visible. The superficial great saphenous vein was also interrogated. Spectral Doppler was utilized to evaluate flow at rest and with distal augmentation maneuvers in the common femoral, femoral and popliteal veins. COMPARISON:  December 25, 2021 FINDINGS: RIGHT LOWER EXTREMITY Common Femoral Vein: No evidence of thrombus. Normal compressibility, respiratory phasicity and response to augmentation. Saphenofemoral Junction: No evidence of thrombus. Normal compressibility and flow on color Doppler imaging. Profunda Femoral Vein: No evidence of thrombus. Normal compressibility and flow on color Doppler imaging. Femoral Vein: No evidence of thrombus. Normal compressibility, respiratory phasicity and response to augmentation. Popliteal Vein: No evidence of thrombus. Normal compressibility, respiratory phasicity and response to augmentation. Calf Veins: No evidence of thrombus. Normal compressibility and flow on color Doppler imaging. Superficial Great Saphenous Vein: No evidence of thrombus. Normal compressibility. Venous Reflux:  None. Other Findings:  None. LEFT LOWER EXTREMITY Common Femoral Vein: No evidence of thrombus. Normal compressibility, respiratory phasicity and response to augmentation. Saphenofemoral Junction: No evidence of thrombus. Normal compressibility and flow on color Doppler imaging. Profunda Femoral Vein: No evidence of thrombus. Normal compressibility and flow on color Doppler imaging. Femoral Vein: No evidence of thrombus. Normal compressibility, respiratory phasicity and response to augmentation. Popliteal Vein: No evidence of thrombus. Normal compressibility, respiratory phasicity and response to augmentation. Calf Veins: No evidence of thrombus. Normal compressibility and flow on color Doppler imaging. Superficial Great Saphenous Vein: No evidence of thrombus. Normal compressibility. Venous Reflux:  None. Other Findings:  None. IMPRESSION: No evidence of deep venous  thrombosis in either lower extremity. Electronically Signed   By: Aram Candela M.D.   On: 08/18/2023 04:44   CT Angio Chest Pulmonary Embolism (PE) W or WO Contrast  Result Date: 08/17/2023 CLINICAL DATA:  Pulmonary embolism (PE) suspected, high prob. Dizzy, chest pain, sob, lower extremity swelling. Hip surgery EXAM: CT ANGIOGRAPHY CHEST WITH CONTRAST TECHNIQUE: Multidetector CT imaging of the chest was performed using the standard protocol during bolus administration of intravenous  contrast. Multiplanar CT image reconstructions and MIPs were obtained to evaluate the vascular anatomy. RADIATION DOSE REDUCTION: This exam was performed according to the departmental dose-optimization program which includes automated exposure control, adjustment of the mA and/or kV according to patient size and/or use of iterative reconstruction technique. CONTRAST:  75mL OMNIPAQUE IOHEXOL 350 MG/ML SOLN COMPARISON:  None Available. FINDINGS: Cardiovascular: Satisfactory opacification of the pulmonary arteries to the segmental level. Right upper lobe segmental and subsegmental pulmonary emboli. Normal heart size. No significant pericardial effusion. The thoracic aorta is normal in caliber. Mild atherosclerotic plaque of the thoracic aorta. Left anterior descending coronary artery stent. Tortuous descending thoracic aorta. Mediastinum/Nodes: Small hiatal hernia. No enlarged mediastinal, hilar, or axillary lymph nodes. Thyroid gland, trachea, and esophagus demonstrate no significant findings. Lungs/Pleura: No focal consolidation. No pulmonary nodule. No pulmonary mass. No pleural effusion. No pneumothorax. Upper Abdomen: No acute abnormality. Musculoskeletal: No chest wall abnormality.  Bilateral breast implants. No suspicious lytic or blastic osseous lesions. No acute displaced fracture. Multilevel degenerative changes of the spine. Chronic T9 vertebral body height loss. Review of the MIP images confirms the above findings.  IMPRESSION: 1. Right upper lobe segmental and subsegmental pulmonary emboli. No pulmonary infarction or right heart strain. 2.  Small hiatal hernia. These results were called by telephone at the time of interpretation on 08/17/2023 at 11:27 pm to provider EKTA PATEL , who verbally acknowledged these results. Electronically Signed   By: Tish Frederickson M.D.   On: 08/17/2023 23:30     Telemetry    RSR, sinus brady - Personally Reviewed  Cardiac Studies   Cardiac Catheterization and Percutaneous Coronary Intervention 11.11.2024   Diagnostic Dominance: Right  Intervention - The proximal LAD was successfully treated using a 4.0 x 15 Onyx Frontier DES.    LVEDP 10 mmHg. The left ventricular ejection fraction is 55-65% by visual estimate. There are LV function abnormalities. _____________   2D Echocardiogram 11.11.2024    1. Left ventricular ejection fraction, by estimation, is 60 to 65%. The  left ventricle has normal function. The left ventricle has no regional  wall motion abnormalities. Left ventricular diastolic parameters are  consistent with Grade I diastolic  dysfunction (impaired relaxation). The average left ventricular global  longitudinal strain is -11.6 %.   2. Right ventricular systolic function is normal. The right ventricular  size is normal. There is normal pulmonary artery systolic pressure. The  estimated right ventricular systolic pressure is 32.4 mmHg.   3. The mitral valve is normal in structure. Mild to moderate mitral valve  regurgitation. No evidence of mitral stenosis.   4. Tricuspid valve regurgitation is mild to moderate.   5. The aortic valve is tricuspid. Aortic valve regurgitation is not  visualized. No aortic stenosis is present.   6. The inferior vena cava is normal in size with greater than 50%  respiratory variability, suggesting right atrial pressure of 3 mmHg.  _____________     Patient Profile     62 y.o. female w/a h/o HFpEF, HTN, and chronic  pain, admitted 08/19/2023 w/ acute anterior STEMI s/p prox LAD stent (residual apical LAD occlusion), w/ subsequent finding of right upper lobe segmental and subsegmental pulmonary emboli.  Developed hypotension 11/13 in setting of pain meds and carvedilol.   Assessment & Plan    1.  Acute anterior STEMI/CAD:  Admitted w/ chest pain and ant ST elevation on 11/13. Found to have apical LAD embolus and 70% prox LAD dzs  S/p DES to the pLAD.  Hs[place] to 10831.  Nl LV fxn by echo.  No c/p or dyspnea.  ? blocker remains on hold.  Cont asa x 30 days (in setting of eliquis for PE), plavix, statin.  Strongly encouraged her to consider cardiopulm rehab.  F/u in our office next wk.   2.  RUL PE:  Noted on CT post-cath.  No c/p, dyspnea, hypoxia.  Currently on eliquis 10 bid x 7 days  5 bid.   3.  Hypotension:  In setting pain meds and ? blocker.  Briefly req pressors.  Now on midodrine.  BP lower throughout early morning hours, but 147/94 currently.  Midodrine reduced to 5 TID this AM.  Plan for d/c this afternoon if BP stable today.  Follow.  I suspect that we'll be looking at d/c'ing midodrine in outpt setting and potentially can get her back on carvedilol at f/u.   4.  NSVT:  Quiescent.     5.  HL:  LDL 53.  LFTs nl. Cont atorva 40.  Signed, Nicolasa Ducking, NP  08/21/2023, 2:35 PM    For questions or updates, please contact   Please consult [email or web address] for contact info under Cardiology/STEMI.

## 2023-08-21 NOTE — Progress Notes (Signed)
Physical Therapy Treatment Patient Details Name: Alicia Moyer MRN: [id number] DOB: December 29, 1960 Today's Date: 08/21/2023   History of Present Illness Alicia Moyer is a 62 y.o. female with a h/o HFpEF, HTN, and chronic pain, admitted on 08/19/2023 w/ acute anterior STEMI s/p prox LAD stent (residual apical LAD occlusion), w/ subsequent finding of right upper lobe segmental and subsegmental pulmonary emboli. Developed hypotension 11/13 in setting of pain meds and carvedilol.    PT Comments  Pt A&Ox4 agreeable to PT. 7/10 back pain but after ambulation decreased to 3/10. She was able to ambulate >273ft with RW, no LOB. She also navigated a full flight of stairs with one rail, CGA. Back in her room with needs in reach. The patient would benefit from further skilled PT intervention to continue to progress towards goals.     If plan is discharge home, recommend the following: A little help with walking and/or transfers;Help with stairs or ramp for entrance   Can travel by private vehicle        Equipment Recommendations  None recommended by PT    Recommendations for Other Services       Precautions / Restrictions Precautions Precautions: Fall Restrictions Weight Bearing Restrictions: No     Mobility  Bed Mobility               General bed mobility comments: not tested; recieved and left in recliner    Transfers Overall transfer level: Needs assistance Equipment used: Rolling walker (2 wheels) Transfers: Sit to/from Stand Sit to Stand: Supervision                Ambulation/Gait Ambulation/Gait assistance: Supervision Gait Distance (Feet): 250 Feet Assistive device: Rolling walker (2 wheels)         General Gait Details: Pt with abnormal gait pattern at baseline d/t history of RLE leg length discrepancy.   Stairs Stairs: Yes Stairs assistance: Supervision Stair Management: One rail Left Number of Stairs: 14 General stair comments: steady,  safe   Wheelchair Mobility     Tilt Bed    Modified Rankin (Stroke Patients Only)       Balance Overall balance assessment: Needs assistance Sitting-balance support: No upper extremity supported, Feet supported Sitting balance-Leahy Scale: Good     Standing balance support: During functional activity, Bilateral upper extremity supported Standing balance-Leahy Scale: Good                              Cognition Arousal: Alert Behavior During Therapy: WFL for tasks assessed/performed Overall Cognitive Status: Within Functional Limits for tasks assessed                                          Exercises      General Comments        Pertinent Vitals/Pain Pain Assessment Pain Assessment: 0-10 Pain Score: 3  Pain Location: back, after ambulating Pain Descriptors / Indicators: Tightness, Sore Pain Intervention(s): Limited activity within patient's tolerance, Monitored during session, Repositioned    Home Living                          Prior Function            PT Goals (current goals can now be found in the care plan section) Progress towards PT  goals: Progressing toward goals    Frequency    Min 1X/week      PT Plan      Co-evaluation              AM-PAC PT "6 Clicks" Mobility   Outcome Measure  Help needed turning from your back to your side while in a flat bed without using bedrails?: None Help needed moving from lying on your back to sitting on the side of a flat bed without using bedrails?: None Help needed moving to and from a bed to a chair (including a wheelchair)?: None Help needed standing up from a chair using your arms (e.g., wheelchair or bedside chair)?: None Help needed to walk in hospital room?: None Help needed climbing 3-5 steps with a railing? : A Little 6 Click Score: 23    End of Session Equipment Utilized During Treatment: Gait belt Activity Tolerance: Patient tolerated  treatment well Patient left: in chair;with call bell/phone within reach;with chair alarm set Nurse Communication: Mobility status PT Visit Diagnosis: Unsteadiness on feet (R26.81)     Time: 8413-2440 PT Time Calculation (min) (ACUTE ONLY): 16 min  Charges:    $Therapeutic Activity: 8-22 mins PT General Charges $$ ACUTE PT VISIT: 1 Visit                     Olga Coaster PT, DPT 1:17 PM,08/21/23

## 2023-08-22 LAB — PROTEIN C, TOTAL: Protein C, Total: 70 % (ref 60–150)

## 2023-08-25 LAB — PROTHROMBIN GENE MUTATION

## 2023-08-27 NOTE — Progress Notes (Signed)
Cardiology Clinic Note   Date: 08/28/2023 ID: Alicia Moyer, DOB 08-15-1961, MRN [id number]  Primary Cardiologist:  Debbe Odea, MD  Patient Profile    Alicia Moyer is a 62 y.o. female who presents to the clinic today for hospital follow up.     Past medical history significant for: CAD. LHC 08/17/2023 (STEMI): Proximal LAD 70% (ulcerative and heavily thrombotic).  Distal LAD #1 30%, #2 100% (thrombotic).  No angiographically significant CAD in LCx or RCA.  OCT guided PCI with DES to proximal LAD.  Preserved LVSF with apical akinesis.  Normal LV filling pressure. Chronic diastolic heart failure. Echo 08/19/2023: EF 60 to 65%.  No RWMA.  Grade I DD.  Normal RV size/function.  Normal PA pressure.  Mild to moderate MR/TR.   NSVT. Hypertension. Hyperlipidemia. Lipid panel 08/17/2023: LDL 53, HDL 52, TG 54, total 160. LPa 08/18/2023: <8.4. PE. CTA chest PE protocol 08/17/2023: Right upper lobe segmental and subsegmental PE.  No pulmonary infarction or right heart strain.  Small hiatal hernia. Chronic pain syndrome.  In summary, patient was previously seen by [facility] clinic cardiology in June and July 2019.  She presented to [facility] clinic cardiology secondary to her son being diagnosed with bicuspid valve.  At the time of her initial visit she reported a 1 to 2-year history of intermittent chest discomfort.  Echo at that time showed normal LV function with mild LVH, EF 55%, normal RV function, trivial MR, mild TR.  Nuclear stress test was a normal low risk study.  In March 2023 patient presented to [facility] ED for lower extremity edema.  BP 172/107.  EKG showed NSR with no acute changes.  BNP 194.2.  Patient was referred to heart failure clinic.  She was seen by the heart failure clinic on 12/31/2021.  She reported a 7 pound weight gain prior to ED visit.  She was restarted on losartan and prescribed Lasix and potassium.  Echo showed EF 60 to 65%, no RWMA, Grade II DD, mild MR.  Upon  follow-up she was euvolemic and medications were continued.  She was referred to cardiology to establish care.  She was evaluated by Dr. Azucena Cecil on 01/24/2022.  She was stable at that time and no changes were made.     History of Present Illness    Alicia Moyer is followed by Dr. Azucena Cecil and heart failure clinic for the above outlined history.   Patient was last seen by the heart failure clinic on 05/29/2022 for an acute visit secondary to fatigue and shortness of breath.  She reported 2 nights prior her weight was up 5 pounds and she had worsening edema.  At the time of her visit edema had improved.  She was instructed to take an extra dose of Lasix and potassium that day and then continue all medications as prescribed.  Patient presented to the ED on 08/17/2023 via EMS for chest pain, nausea, shortness of breath.  She had been in her usual state of health when around lunchtime she developed nausea and later in the afternoon sudden onset of substernal chest pain radiating to both arms with shortness of breath and diaphoresis.  Upon EMS arrival she was found to have inferior ST segment elevation and STEMI was activated.  She received aspirin, NTG, fentanyl en route.  Upon arrival to ED EKG still showed subtle inferior ST segment elevation and she was taken emergently to the Cath Lab where she underwent PCI with DES to proximal LAD (details above).  CTA chest was positive for right upper lobe segmental and subsegmental PE.  Venous ultrasound was negative for DVT.  Echo showed normal LV/RV function, Grade I DD, mild to moderate MR/TR.  Patient was discharged on 08/21/2023 on Eliquis, aspirin, Plavix, atorvastatin.  Discussed the use of AI scribe software for clinical note transcription with the patient, who gave verbal consent to proceed.  The patient presents alone for hospital follow-up. She reports she is doing well. Patient denies shortness of breath, dyspnea on exertion, orthopnea or PND. No  chest pain, pressure, or tightness. No palpitations.  She does have some lower extremity edema, particularly if she falls asleep sitting up in a chair. She feels she has a little more energy since getting out of the hospital. She is tolerating her medications without difficulty. No blood in stool or urine. She recently had a mechanical fall in her bedroom resulting in bruising on medial aspect of right forearm, lateral arm near elbow, and right shoulder. She did not hit her head or lose consciousness. No dizziness or palpitations prior to fall. She is interested in starting cardiac rehab.         ROS: All other systems reviewed and are otherwise negative except as noted in History of Present Illness.  Studies Reviewed    EKG Interpretation Date/Time:  Friday August 28 2023 09:02:20 EST Ventricular Rate:  74 PR Interval:  104 QRS Duration:  84 QT Interval:  376 QTC Calculation: 417 R Axis:   55  Text Interpretation: Sinus rhythm with short PR Nonspecific T wave abnormality When compared with ECG of 18-Aug-2023 15:29, PREVIOUS ECG IS PRESENT Confirmed by Alicia Moyer [phone]) on 08/28/2023 9:06:58 AM           Physical Exam    VS:  BP 124/76 (BP Location: Left Arm, Patient Position: Sitting, Cuff Size: Normal)   Pulse 74   Ht 5\' 4"  (1.626 m)   Wt 159 lb 3.2 oz (72.2 kg)   SpO2 96%   BMI 27.33 kg/m  , BMI Body mass index is 27.33 kg/m.  GEN: Well nourished, well developed, in no acute distress. Neck: No JVD or carotid bruits. Cardiac:  RRR. No murmurs. No rubs or gallops.   Respiratory:  Respirations regular and unlabored. Clear to auscultation without rales, wheezing or rhonchi. GI: Soft, nontender, nondistended. Extremities: Radials/DP/PT 2+ and equal bilaterally. No clubbing or cyanosis. 1+ R>L pitting edema bilateral lower extremities.   Skin: Warm and dry, no rash. Neuro: Strength intact.  Assessment & Plan      Fall with Bruising Recent fall due to imbalance,  resulting in significant bruising on the arm. No other injuries reported. No dizziness or lightheadedness associated with the fall. -Continue to monitor bruising and report any changes or increased pain.  CAD S/p PCI with DES to proximal LAD 08/17/2023.  Patient is doing well since discharge from hospital. She denies chest pain, pressure or tightness. No shortness of breath. Her right wrist cath site is well healed without drainage or tenderness. 2+ radial pulse. She did have a mechanical fall hitting her arm resulting in ecchymosis to medial aspect of right forearm, lateral arm distal to elbow, and right shoulder. Patient was started back on carvedilol 3.125 once a day by PCP.  She is interested in going to cardiac rehab.  -Continue Eliquis, aspirin, Plavix, atorvastatin.  Plan to continue aspirin for 30 days.  Beta-blocker held during hospital admission secondary to hypotension. -Will resend carvedilol 3.125 mg for twice a day.  Chronic diastolic heart failure/chronic lower extremity edema Echo November 2024 showed normal LV/RV function, grade I DD. Patient reports lower extremity edema especially if she falls asleep with legs in dependent position. No shortness of breath, orthopnea or PND. Euvolemic and well compensated on exam. Losartan stopped in hospital secondary to hypotension. 1+ pitting edema R>L lower extremity.  -Continue carvedilol (see above).   PE CTA chest showed right upper lobe segmental and subsegmental PE.  Patient denies shortness of breath. Denies spontaneous bleeding concerns. Lung sounds clear to auscultation.  -Continue Eliquis.  Hypertension BP today 124/76. She denies lightheadedness or dizziness.  -Patient had hypotension during hospital admission requiring brief pressors and midodrine.  BP stabilized and midodrine stopped. -Start carvedilol as above.  Hyperlipidemia LDL November 2024 53, at goal. -Continue atorvastatin.       Disposition: Increase carvedilol  to 3.125 mg bid. Return in 3 months or sooner as needed.      Cardiac Rehabilitation Eligibility Assessment  The patient is ready to start cardiac rehabilitation from a cardiac standpoint.        Signed, Etta Grandchild. Ostin Mathey, DNP, NP-C

## 2023-08-28 ENCOUNTER — Ambulatory Visit: Payer: Medicare Other | Attending: Student | Admitting: Student

## 2023-08-28 ENCOUNTER — Encounter: Payer: Self-pay | Admitting: Student

## 2023-08-28 VITALS — BP 124/76 | HR 74 | Ht 64.0 in | Wt 159.2 lb

## 2023-08-28 DIAGNOSIS — I2699 Other pulmonary embolism without acute cor pulmonale: Secondary | ICD-10-CM | POA: Diagnosis not present

## 2023-08-28 DIAGNOSIS — R600 Localized edema: Secondary | ICD-10-CM

## 2023-08-28 DIAGNOSIS — I2510 Atherosclerotic heart disease of native coronary artery without angina pectoris: Secondary | ICD-10-CM

## 2023-08-28 DIAGNOSIS — I5032 Chronic diastolic (congestive) heart failure: Secondary | ICD-10-CM | POA: Diagnosis not present

## 2023-08-28 DIAGNOSIS — W19XXXA Unspecified fall, initial encounter: Secondary | ICD-10-CM

## 2023-08-28 DIAGNOSIS — I10 Essential (primary) hypertension: Secondary | ICD-10-CM

## 2023-08-28 DIAGNOSIS — E785 Hyperlipidemia, unspecified: Secondary | ICD-10-CM

## 2023-08-28 MED ORDER — CARVEDILOL 3.125 MG PO TABS: 3.1250 mg | ORAL_TABLET | Freq: Two times a day (BID) | ORAL | 3 refills | Status: DC

## 2023-08-28 NOTE — Patient Instructions (Signed)
Medication Instructions:  Your physician has recommended you make the following change in your medication:   Stop Aspirin after last dose on 12/15 CHANGE Carvedilol to 3.125 mg twice a day   *If you need a refill on your cardiac medications before your next appointment, please call your pharmacy*   Lab Work: BMET & CBC today in the office.   If you have labs (blood work) drawn today and your tests are completely normal, you will receive your results only by: MyChart Message (if you have MyChart) OR A paper copy in the mail If you have any lab test that is abnormal or we need to change your treatment, we will call you to review the results.   Testing/Procedures: None   Follow-Up: At [facility], you and your health needs are our priority.  As part of our continuing mission to provide you with exceptional heart care, we have created designated Provider Care Teams.  These Care Teams include your primary Cardiologist (physician) and Advanced Practice Providers (APPs -  Physician Assistants and Nurse Practitioners) who all work together to provide you with the care you need, when you need it.   Your next appointment:   3 month(s)  Provider:   Debbe Odea, MD or Carlos Levering, NP

## 2023-08-29 LAB — BASIC METABOLIC PANEL
BUN/Creatinine Ratio: 13 (ref 12–28)
BUN: 12 mg/dL (ref 8–27)
CO2: 25 mmol/L (ref 20–29)
Calcium: 9.3 mg/dL (ref 8.7–10.3)
Chloride: 105 mmol/L (ref 96–106)
Creatinine, Ser: 0.93 mg/dL (ref 0.57–1.00)
Glucose: 97 mg/dL (ref 70–99)
Potassium: 4.5 mmol/L (ref 3.5–5.2)
Sodium: 142 mmol/L (ref 134–144)
eGFR: 69 mL/min/{1.73_m2} (ref >59)

## 2023-08-29 LAB — CBC
Hematocrit: 36.9 % (ref 34.0–46.6)
Hemoglobin: 11.8 g/dL (ref 11.1–15.9)
MCH: 29.9 pg (ref 26.6–33.0)
MCHC: 32 g/dL (ref 31.5–35.7)
MCV: 94 fL (ref 79–97)
Platelets: 384 10*3/uL (ref 150–450)
RBC: 3.94 x10E6/uL (ref 3.77–5.28)
RDW: 13 % (ref 11.7–15.4)
WBC: 9.5 10*3/uL (ref 3.4–10.8)

## 2023-08-31 LAB — FACTOR 5 LEIDEN

## 2023-09-01 ENCOUNTER — Encounter: Payer: Self-pay | Admitting: Emergency Medicine

## 2023-09-28 ENCOUNTER — Telehealth: Payer: Self-pay | Admitting: Cardiology

## 2023-09-28 MED ORDER — CARVEDILOL 3.125 MG PO TABS
3.1250 mg | ORAL_TABLET | Freq: Two times a day (BID) | ORAL | 0 refills | Status: DC
Start: 2023-09-28 — End: 2024-01-15

## 2023-09-28 MED ORDER — ATORVASTATIN CALCIUM 40 MG PO TABS: 40.0000 mg | ORAL_TABLET | Freq: Every day | ORAL | 0 refills | Status: DC

## 2023-09-28 NOTE — Telephone Encounter (Signed)
Pharmacy confirmed with patient.    Requested Prescriptions   Signed Prescriptions Disp Refills   atorvastatin (LIPITOR) 40 MG tablet 90 tablet 0    Sig: Take 1 tablet (40 mg total) by mouth daily.    Authorizing Provider: Debbe Odea    Ordering User: Feliberto Harts L   carvedilol (COREG) 3.125 MG tablet 180 tablet 0    Sig: Take 1 tablet (3.125 mg total) by mouth 2 (two) times daily.    Authorizing Provider: Debbe Odea    Ordering User: Guerry Minors   last office visit: 08/28/23 plan to f/u in 3 months  next visit:  12/04/23

## 2023-09-28 NOTE — Telephone Encounter (Signed)
*  STAT* If patient is at the pharmacy, call can be transferred to refill team.   1. Which medications need to be refilled? (please list name of each medication and dose if known)   apixaban (ELIQUIS) 5 MG TABS tablet (Expired)    carvedilol (COREG) 3.125 MG tablet    atorvastatin (LIPITOR) 40 MG tablet    2. Which pharmacy/location (including street and city if local pharmacy) is medication to be sent to? [facility]- [place] - [address] Phone: [phone]  Fax: [phone]     3. Do they need a 30 day or 90 day supply? 90

## 2023-10-26 ENCOUNTER — Ambulatory Visit: Payer: Medicare Other | Admitting: Cardiology

## 2023-12-02 NOTE — Progress Notes (Unsigned)
 Cardiology Clinic Note   Date: 12/04/2023 ID: ALVETA QUINTELA, DOB 04-08-1961, MRN [id number]  Primary Cardiologist:  Debbe Odea, MD  Chief Complaint   Alicia Moyer is a 63 y.o. female who presents to the clinic today for routine follow up.   Patient Profile   Alicia Moyer is followed by Dr. Azucena Cecil for the history outlined below.      Past medical history significant for: CAD. LHC 08/17/2023 (STEMI): Proximal LAD 70% (ulcerative and heavily thrombotic).  Distal LAD #1 30%, #2 100% (thrombotic).  No angiographically significant CAD in LCx or RCA.  OCT guided PCI with DES to proximal LAD.  Preserved LVSF with apical akinesis.  Normal LV filling pressure. Chronic diastolic heart failure. Echo 08/19/2023: EF 60 to 65%.  No RWMA.  Grade I DD.  Normal RV size/function.  Normal PA pressure.  Mild to moderate MR/TR.   NSVT. Hypertension. Hyperlipidemia. LPa 08/18/2023: <8.4. Lipid panel 11/24/2023: LDL 64, HDL 46, TG 69, total 124. PE. CTA chest PE protocol 08/17/2023: Right upper lobe segmental and subsegmental PE.  No pulmonary infarction or right heart strain.  Small hiatal hernia. Chronic pain syndrome.  In summary, patient was previously seen by [facility] clinic cardiology in June and July 2019.  She presented to [facility] clinic cardiology secondary to her son being diagnosed with bicuspid valve.  At the time of her initial visit she reported a 1 to 2-year history of intermittent chest discomfort.  Echo at that time showed normal LV function with mild LVH, EF 55%, normal RV function, trivial MR, mild TR.  Nuclear stress test was a normal low risk study.  In March 2023 patient presented to [facility] ED for lower extremity edema.  BP 172/107.  EKG showed NSR with no acute changes.  BNP 194.2.  Patient was referred to heart failure clinic.  She was seen by the heart failure clinic on 12/31/2021.  She reported a 7 pound weight gain prior to ED visit.  She was restarted on losartan and  prescribed Lasix and potassium.  Echo showed EF 60 to 65%, no RWMA, Grade II DD, mild MR.  Upon follow-up she was euvolemic and medications were continued.  She was referred to cardiology to establish care.  She was evaluated by Dr. Azucena Cecil on 01/24/2022.  She was stable at that time and no changes were made.   Patient was last seen in the office by me on 08/28/2023 for hospital follow-up.  She was doing well at that time.  She reported a recent mechanical fall resulting in some bruising of her arm.  She did not hit her head or lose consciousness.  She was tolerating all of her medications well and no changes were made.     History of Present Illness    Today, patient is here alone. 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 Patient states she was in [facility] when she had a mechanical fall at [employer]. Her initial head CT was negative then she suffered a seizure and repeat CT showed subdural hematoma. She does not think she has seen a neurologist since. She recently returned to [place] from [facility]. She reports a 2+ week history of increased lower extremity edema. She states she was given a short course of Lasix possibly 2 weeks ago. She normally weighs daily but was not weighing while in [facility] because she does not have a scale there. She reports she has  lost a significant amount of weight because she was not eating in [place] "I just didn't feel like fixing myself anything so I didn't eat." She feels her weight is up stating "I should be around 137 lb now" but it is unclear if that is what her weight was. It is unclear if she had a big jump in her weight. She sleeps in a recliner stating that she does not want to sleep in bed with her husband. Patient also reports shortness of breath. She has difficulty describing when this occurs. She states that it can happen when she is sitting down and she feels like she has to drawn in a deep breath. When asked if it  occurs with activity she states "I don't do much but maybe." She also describes episodes of central chest tightness with radiation down right arm similar to when she had a heart attack. She states it occurs at rest but it is unclear if she also gets discomfort with walking or other activities. She is unable to tell me how long pain lasts or how frequently she is experiencing it. 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 She has never been seen by hematology. She completed her course of Eliquis and is now on Plavix and aspirin.    ROS: Review of systems limited secondary to patient's memory loss post subdural hematoma.   EKGs/Labs Reviewed    EKG Interpretation Date/Time:  Friday December 04 2023 09:37:10 EST Ventricular Rate:  69 PR Interval:  112 QRS Duration:  78 QT Interval:  430 QTC Calculation: 460 R Axis:   16  Text Interpretation: Normal sinus rhythm Normal ECG When compared with ECG of 28-Aug-2023 09:02, No significant change was found Confirmed by Carlos Levering [phone]) on 12/04/2023 9:38:30 AM   08/17/2023: ALT 25; AST 39 08/28/2023: BUN 12; Creatinine, Ser 0.93; Potassium 4.5; Sodium 142   08/28/2023: Hemoglobin 11.8; WBC 9.5    Physical Exam    VS:  BP 138/80 (BP Location: Left Arm, Patient Position: Sitting, Cuff Size: Normal)   Pulse 70   Ht 5\' 4"  (1.626 m)   Wt 148 lb 6.4 oz (67.3 kg)   SpO2 98%   BMI 25.47 kg/m  , BMI Body mass index is 25.47 kg/m.  GEN: Well nourished, well developed, in no acute distress. Neck: No JVD or carotid bruits. Cardiac:  RRR. No murmurs. No rubs or gallops.   Respiratory:  Respirations regular and unlabored. Clear to auscultation without rales, wheezing or rhonchi. GI: Soft, nontender, nondistended. Extremities: Radials/DP/PT 2+ and equal bilaterally. No clubbing or cyanosis. Trace lower extremity edema.   Skin: Warm and dry, no rash. Neuro: Strength intact.  Assessment & Plan    CAD S/p PCI with DES to proximal LAD 08/17/2023.  Patient reports episodes of chest tightness radiating to right arm similar to pain she had with STEMI. She is having difficulty describing how frequently the episodes are occurring or how long they last. It is unclear if she has associated shortness of breath.  -Rx prn SL NTG.  -Schedule cardiac PET stress test for further evaluation.  -Continue Eliquis, Plavix, atorvastatin, carvedilol.    Chronic diastolic heart failure/chronic lower extremity edema Echo November 2024 showed normal LV/RV function, grade I DD. Patient reports a 2+ week history of lower extremity edema. She states she had a short course of Lasix that helped. She is unsure of the dose. She normally weighs daily but was  not weighing while in [place]. She states she lost a lot of weight while in [place]  as she was not eating. She does admit to dietary indiscretion with sodium since being back in [place]. It is unclear how long she has been home. She feels her weight "should be around 137 lb" but it is unclear what her normal weight is now or if she has had any big jumps in her weight. She sleeps in a recliner for personal reasons. She does report some dyspnea at rest and possibly with activity.  She has trace lower extremity edema today. Euvolemic and well compensated on exam. No indication for diuretic at this time.  -Repeat echo.  -Continue carvedilol.    PE CTA chest showed right upper lobe segmental and subsegmental PE November 2024.  Patient completed her course of Eliquis. She has never been seen by hematology.  -Refer to hematology.   Hypertension BP today 160/86 on intake and 138/80 on my recheck. She denies lightheadedness or dizziness.  -Continue carvedilol   Hyperlipidemia LDL February 2025 64, at goal. -Continue atorvastatin.  Subdural hematoma/Seizure/Memory Loss Patient reports falling at a store in [facility] in November and sustaining a subdural hematoma. She had a seizure while in  the hospital. She reports memory deficits since that time. Throughout her visit she has difficulty recalling details and events including onset of symptoms.  -Refer to neurology.   Disposition: Echo. Cardiac PET stress. Refer to hematology for history of unprovoked PE. Refer to neurology for memory loss. Return in 8-10 weeks or sooner as needed.    Informed Consent   Shared Decision Making/Informed Consent The risks [chest pain, shortness of breath, cardiac arrhythmias, dizziness, blood pressure fluctuations, myocardial infarction, stroke/transient ischemic attack, nausea, vomiting, allergic reaction, radiation exposure, metallic taste sensation and life-threatening complications (estimated to be 1 in 10,000)], benefits (risk stratification, diagnosing coronary artery disease, treatment guidance) and alternatives of a cardiac PET stress test were discussed in detail with Ms. Ayon and she agrees to proceed.      Signed, Etta Grandchild. Almee Pelphrey, DNP, NP-C

## 2023-12-04 ENCOUNTER — Ambulatory Visit: Payer: Medicare Other | Attending: Student | Admitting: Student

## 2023-12-04 ENCOUNTER — Encounter: Payer: Self-pay | Admitting: Student

## 2023-12-04 VITALS — BP 138/80 | HR 70 | Ht 64.0 in | Wt 148.4 lb

## 2023-12-04 DIAGNOSIS — R413 Other amnesia: Secondary | ICD-10-CM

## 2023-12-04 DIAGNOSIS — R0609 Other forms of dyspnea: Secondary | ICD-10-CM | POA: Diagnosis not present

## 2023-12-04 DIAGNOSIS — S065XAA Traumatic subdural hemorrhage with loss of consciousness status unknown, initial encounter: Secondary | ICD-10-CM

## 2023-12-04 DIAGNOSIS — R079 Chest pain, unspecified: Secondary | ICD-10-CM | POA: Diagnosis not present

## 2023-12-04 DIAGNOSIS — I25118 Atherosclerotic heart disease of native coronary artery with other forms of angina pectoris: Secondary | ICD-10-CM | POA: Diagnosis not present

## 2023-12-04 DIAGNOSIS — E785 Hyperlipidemia, unspecified: Secondary | ICD-10-CM

## 2023-12-04 DIAGNOSIS — Z86711 Personal history of pulmonary embolism: Secondary | ICD-10-CM

## 2023-12-04 DIAGNOSIS — R569 Unspecified convulsions: Secondary | ICD-10-CM | POA: Diagnosis not present

## 2023-12-04 DIAGNOSIS — I10 Essential (primary) hypertension: Secondary | ICD-10-CM

## 2023-12-04 DIAGNOSIS — S065XAD Traumatic subdural hemorrhage with loss of consciousness status unknown, subsequent encounter: Secondary | ICD-10-CM

## 2023-12-04 MED ORDER — NITROGLYCERIN 0.4 MG SL SUBL: 0.4000 mg | SUBLINGUAL_TABLET | SUBLINGUAL | 3 refills | Status: AC | PRN

## 2023-12-04 NOTE — Patient Instructions (Signed)
 Medication Instructions:  Your physician recommends the following medication changes.  START TAKING: NitroSTAT 0.4 mg SL (under the tongue) as needed for chest pain once every 5 minute while having active chest pain for a maximum of 3 doses  *If you need a refill on your cardiac medications before your next appointment, please call your pharmacy*   Lab Work: None ordered at this time  If you have labs (blood work) drawn today and your tests are completely normal, you will receive your results only by: MyChart Message (if you have MyChart) OR A paper copy in the mail If you have any lab test that is abnormal or we need to change your treatment, we will call you to review the results.   Testing/Procedures: Your physician has requested that you have an echocardiogram. Echocardiography is a painless test that uses sound waves to create images of your heart. It provides your doctor with information about the size and shape of your heart and how well your heart's chambers and valves are working.   You may receive an ultrasound enhancing agent through an IV if needed to better visualize your heart during the echo. This procedure takes approximately one hour.  There are no restrictions for this procedure.  This will take place at [address] [facility] [address] [facility], [place] [address]  Please note: We ask at that you not bring children with you during ultrasound (echo/ vascular) testing. Due to room size and safety concerns, children are not allowed in the ultrasound rooms during exams. Our front office staff cannot provide observation of children in our lobby area while testing is being conducted. An adult accompanying a patient to their appointment will only be allowed in the ultrasound room at the discretion of the ultrasound technician under special circumstances. We apologize for any inconvenience.   Please report to Radiology at [facility] Main Entrance,  medical mall, 30 mins prior to your test.  [address]  [place], [place]  How to Prepare for Your Cardiac PET/CT Stress Test:  Nothing to eat or drink, except water, 3 hours prior to arrival time.  NO caffeine/decaffeinated products, or chocolate 12 hours prior to arrival. (Please note decaffeinated beverages (teas/coffees) still contain caffeine).  If you have caffeine within 12 hours prior, the test will need to be rescheduled.  Medication instructions: Do not take erectile dysfunction medications for 72 hours prior to test (sildenafil, tadalafil) Do not take nitrates (isosorbide mononitrate, Ranexa) the day before or day of test Do not take tamsulosin the day before or morning of test Hold theophylline containing medications for 12 hours. Hold Dipyridamole 48 hours prior to the test.  Diabetic Preparation: If able to eat breakfast prior to 3 hour fasting, you may take all medications, including your insulin. Do not worry if you miss your breakfast dose of insulin - start at your next meal. If you do not eat prior to 3 hour fast-Hold all diabetes (oral and insulin) medications. Patients who wear a continuous glucose monitor MUST remove the device prior to scanning.  You may take your remaining medications with water.  NO perfume, cologne or lotion on chest or abdomen area. FEMALES - Please avoid wearing dresses to this appointment.  Total time is 1 to 2 hours; you may want to bring reading material for the waiting time.  IF YOU THINK YOU MAY BE PREGNANT, OR ARE NURSING PLEASE INFORM THE TECHNOLOGIST.  In preparation for your appointment, medication and supplies will  be purchased.  Appointment availability is limited, so if you need to cancel or reschedule, please call the Radiology Department Scheduler at [phone] 24 hours in advance to avoid a cancellation fee of $100.00  What to Expect When you Arrive:  Once you arrive and check in for your appointment, you will be  taken to a preparation room within the Radiology Department.  A technologist or Nurse will obtain your medical history, verify that you are correctly prepped for the exam, and explain the procedure.  Afterwards, an IV will be started in your arm and electrodes will be placed on your skin for EKG monitoring during the stress portion of the exam. Then you will be escorted to the PET/CT scanner.  There, staff will get you positioned on the scanner and obtain a blood pressure and EKG.  During the exam, you will continue to be connected to the EKG and blood pressure machines.  A small, safe amount of a radioactive tracer will be injected in your IV to obtain a series of pictures of your heart along with an injection of a stress agent.    After your Exam:  It is recommended that you eat a meal and drink a caffeinated beverage to counter act any effects of the stress agent.  Drink plenty of fluids for the remainder of the day and urinate frequently for the first couple of hours after the exam.  Your doctor will inform you of your test results within 7-10 business days.  For more information and frequently asked questions, please visit our website: [email or web address]  For questions about your test or how to prepare for your test, please call: Cardiac Imaging Nurse Navigators Office: [phone]    Follow-Up: At  [facility], you and your health needs are our priority.  As part of our continuing mission to provide you with exceptional heart care, we have created designated Provider Care Teams.  These Care Teams include your primary Cardiologist (physician) and Advanced Practice Providers (APPs -  Physician Assistants and Nurse Practitioners) who all work together to provide you with the care you need, when you need it.  We recommend signing up for the patient portal called "MyChart".  Sign up information is provided on this After Visit Summary.  MyChart is used to connect with  patients for Virtual Visits (Telemedicine).  Patients are able to view lab/test results, encounter notes, upcoming appointments, etc.  Non-urgent messages can be sent to your provider as well.   To learn more about what you can do with MyChart, go to [email or web address].    Your next appointment:   8-10 week(s)  Provider:   You may see Debbe Odea, MD or one of the following Advanced Practice Providers on your designated Care Team:   Nicolasa Ducking, NP Eula Listen, PA-C Cadence Fransico Michael, PA-C Charlsie Quest, NP Carlos Levering, NP

## 2023-12-09 ENCOUNTER — Encounter: Payer: Self-pay | Admitting: Oncology

## 2023-12-09 ENCOUNTER — Inpatient Hospital Stay (HOSPITAL_BASED_OUTPATIENT_CLINIC_OR_DEPARTMENT_OTHER): Admitting: Oncology

## 2023-12-09 ENCOUNTER — Inpatient Hospital Stay: Attending: Oncology

## 2023-12-09 VITALS — BP 155/99 | HR 63 | Temp 99.7°F | Resp 16 | Wt 149.9 lb

## 2023-12-09 DIAGNOSIS — D6861 Antiphospholipid syndrome: Secondary | ICD-10-CM | POA: Insufficient documentation

## 2023-12-09 DIAGNOSIS — R4589 Other symptoms and signs involving emotional state: Secondary | ICD-10-CM

## 2023-12-09 DIAGNOSIS — I2510 Atherosclerotic heart disease of native coronary artery without angina pectoris: Secondary | ICD-10-CM | POA: Diagnosis not present

## 2023-12-09 DIAGNOSIS — Z7982 Long term (current) use of aspirin: Secondary | ICD-10-CM | POA: Insufficient documentation

## 2023-12-09 DIAGNOSIS — Z955 Presence of coronary angioplasty implant and graft: Secondary | ICD-10-CM | POA: Diagnosis not present

## 2023-12-09 DIAGNOSIS — Z79899 Other long term (current) drug therapy: Secondary | ICD-10-CM | POA: Insufficient documentation

## 2023-12-09 DIAGNOSIS — Z7902 Long term (current) use of antithrombotics/antiplatelets: Secondary | ICD-10-CM | POA: Diagnosis not present

## 2023-12-09 DIAGNOSIS — I2699 Other pulmonary embolism without acute cor pulmonale: Secondary | ICD-10-CM
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
## 2023-12-09 NOTE — Assessment & Plan Note (Addendum)
 S/p PCI with DES to proximal LAD  Continue Aspirin 81mg , Plavix 75mg  daily

## 2023-12-09 NOTE — Assessment & Plan Note (Addendum)
 Unprovoked pulmonary embolism, off anticoagulation due to fall risk and history of subdural hematoma. S/p IVC placement Check hypercoagulable work up.  Repeat CT chest angiogram PE protocol

## 2023-12-10 ENCOUNTER — Telehealth: Payer: Self-pay | Admitting: Oncology

## 2023-12-10 ENCOUNTER — Telehealth: Payer: Self-pay

## 2023-12-10 ENCOUNTER — Other Ambulatory Visit

## 2023-12-10 NOTE — Telephone Encounter (Signed)
 Clinical Social Work was referred by medical provider, Dr. Cathie Hoops, for assessment of psychosocial needs.  CSW attempted to contact patient by phone.  Left voicemail with contact information and request for return call.

## 2023-12-10 NOTE — Telephone Encounter (Signed)
 Patient left voicemail requesting lab appointment be changed to Friday 3/7- Appointment changed as requested

## 2023-12-11 ENCOUNTER — Inpatient Hospital Stay

## 2023-12-14 ENCOUNTER — Inpatient Hospital Stay

## 2023-12-14 DIAGNOSIS — I2699 Other pulmonary embolism without acute cor pulmonale: Secondary | ICD-10-CM | POA: Diagnosis not present

## 2023-12-14 LAB — ANTITHROMBIN III: AntiThromb III Func: 109 % (ref 75–120)

## 2023-12-15 LAB — PROTEIN S, TOTAL AND FREE
Protein S Ag, Free: 111 % (ref 61–136)
Protein S Ag, Total: 80 % (ref 60–150)

## 2023-12-15 LAB — BETA-2-GLYCOPROTEIN I ABS, IGG/M/A
Beta-2 Glyco I IgG: <9 GPI IgG units (ref 0–20)
Beta-2-Glycoprotein I IgA: <9 GPI IgA units (ref 0–25)
Beta-2-Glycoprotein I IgM: <9 GPI IgM units (ref 0–32)

## 2023-12-15 LAB — PROTEIN C ACTIVITY: Protein C Activity: 101 % (ref 73–180)

## 2023-12-16 ENCOUNTER — Ambulatory Visit: Admission: RE | Admit: 2023-12-16 | Source: Ambulatory Visit

## 2023-12-16 LAB — ANTIPHOSPHOLIPID SYNDROME PROF
Anticardiolipin IgG: <9 GPL U/mL (ref 0–14)
Anticardiolipin IgM: 20 [MPL'U]/mL — ABNORMAL HIGH (ref 0–12)
DRVVT: 26 s (ref 0.0–47.0)
PTT Lupus Anticoagulant: 27 s (ref 0.0–43.5)

## 2023-12-17 LAB — FACTOR 5 LEIDEN

## 2023-12-18 LAB — PROTHROMBIN GENE MUTATION

## 2023-12-21 ENCOUNTER — Telehealth: Payer: Self-pay

## 2023-12-21 NOTE — Telephone Encounter (Signed)
 Alicia Moyer looks like patient no showed for CT Chest Angiogram on 3/12. Can we get her rescheduled?

## 2023-12-21 NOTE — Telephone Encounter (Signed)
-----   Message from Rickard Patience sent at 12/20/2023  3:00 PM EDT ----- Is her CT chest angiogram scheduled? Next available.

## 2023-12-29 ENCOUNTER — Ambulatory Visit: Admission: RE | Admit: 2023-12-29 | Source: Ambulatory Visit

## 2024-01-05 ENCOUNTER — Ambulatory Visit: Payer: Medicare Other | Attending: Student

## 2024-01-05 DIAGNOSIS — R0609 Other forms of dyspnea: Secondary | ICD-10-CM | POA: Diagnosis not present

## 2024-01-05 LAB — ECHOCARDIOGRAM COMPLETE
AR max vel: 2.34 cm2
AV Area VTI: 2.29 cm2
AV Area mean vel: 2.15 cm2
AV Mean grad: 4 mmHg
AV Peak grad: 7.6 mmHg
Ao pk vel: 1.38 m/s
Area-P 1/2: 2.62 cm2
S' Lateral: 2.7 cm

## 2024-01-06 ENCOUNTER — Ambulatory Visit
Admission: RE | Admit: 2024-01-06 | Discharge: 2024-01-06 | Disposition: A | Discharge status: Home / Self Care | Source: Ambulatory Visit | Attending: Oncology | Admitting: Oncology

## 2024-01-06 DIAGNOSIS — I2699 Other pulmonary embolism without acute cor pulmonale: Secondary | ICD-10-CM | POA: Insufficient documentation

## 2024-01-06 MED ORDER — IOHEXOL 350 MG/ML SOLN
75.0000 mL | Freq: Once | INTRAVENOUS | Status: AC | PRN
  Administered 2024-01-06: 75 mL via INTRAVENOUS

## 2024-01-09 NOTE — Progress Notes (Deleted)
 Cardiology Clinic Note   Date: 01/09/2024 ID: RONESHA HEENAN, DOB 1961-09-04, MRN [id number]  Primary Cardiologist:  Debbe Odea, MD  Chief Complaint   Alicia Moyer is a 63 y.o. female who presents to the clinic today for ***  Patient Profile   Alicia Moyer is followed by *** for the history outlined below.      Past medical history significant for: CAD. LHC 08/17/2023 (STEMI): Proximal LAD 70% (ulcerative and heavily thrombotic).  Distal LAD #1 30%, #2 100% (thrombotic).  No angiographically significant CAD in LCx or RCA.  OCT guided PCI with DES to proximal LAD.  Preserved LVSF with apical akinesis.  Normal LV filling pressure. Cardiac PET/CT 01/14/2024:*** Chronic diastolic heart failure. Echo 08/19/2023: EF 60 to 65%.  No RWMA.  Grade I DD.  Normal RV size/function.  Normal PA pressure.  Mild to moderate MR/TR.*** Echo 01/05/2024: EF 60 to 65%.  No RWMA.  Normal diastolic parameters.  Low normal RV function, normal RV size.  Moderately elevated PA pressure, RVSP 43.8 mmHg.  Mild LAE.  Mild MR.  Mild to moderate TR. NSVT. Hypertension. Hyperlipidemia. LPa 08/18/2023: <8.4. Lipid panel 11/24/2023: LDL 64, HDL 46, TG 69, total 124. PE. CTA chest PE protocol 08/17/2023: Right upper lobe segmental and subsegmental PE.  No pulmonary infarction or right heart strain.  Small hiatal hernia. Chronic pain syndrome.  In summary, patient was previously seen by [facility] clinic cardiology in June and July 2019.  She presented to [facility] clinic cardiology secondary to her son being diagnosed with bicuspid valve.  At the time of her initial visit she reported a 1 to 2-year history of intermittent chest discomfort.  Echo at that time showed normal LV function with mild LVH, EF 55%, normal RV function, trivial MR, mild TR.  Nuclear stress test was a normal low risk study.  In March 2023 patient presented to [facility] ED for lower extremity edema.  BP 172/107.  EKG showed NSR with no acute changes.  BNP  194.2.  Patient was referred to heart failure clinic.  She was seen by the heart failure clinic on 12/31/2021.  She reported a 7 pound weight gain prior to ED visit.  She was restarted on losartan and prescribed Lasix and potassium.  Echo showed EF 60 to 65%, no RWMA, Grade II DD, mild MR.  Upon follow-up she was euvolemic and medications were continued.  She was referred to cardiology to establish care.  She was evaluated by Dr. Azucena Cecil on 01/24/2022.  She was stable at that time and no changes were made.   Patient was seen in the office on 08/28/2023 for hospital follow-up.  She was doing well at that time.  She reported a recent mechanical fall resulting in some bruising of her arm.  She did not hit her head or lose consciousness.  She was tolerating all of her medications well and no changes were made.   Patient was last seen in the office by me on 12/04/2023 for routine follow-up.  She was somewhat of a poor historian secondary to memory loss after subdural hematoma and seizure in November 2024.  She reported, chemical fall out of her rooms to go in [place] in which her initial head CT was negative.  She suffered a seizure and repeat CT showed a subdural hematoma.  She had recently returned to [facility] from [place].  She reported a 2-week history of increased lower extremity edema and reported possibly taking a short  course of Lasix 2 weeks prior.  She was not weighing while in [place] as she did not have a scale there.  She reported significant weight loss secondary to not wanting to eat.  It was difficult to ascertain if she knew what her normal weight was or if she had had vague jumps of increased weight since being home.  She reported dyspnea that she had difficulty describing.  She also reported episodes of central chest tightness with radiation down right arm similar to when she had a heart attack.  It was unclear if chest discomfort occurred with or without exertion.  She  reported having an IVC placed while in the hospital in [place].  She had completed her course of Eliquis and was back to taking aspirin and Plavix.  Echo and PET/CT were ordered as above.  She was referred to neurology as she reported not seeing the neurologist since getting out of the hospital in [place].     History of Present Illness    Today, patient ***  CAD S/p PCI with DES to proximal LAD 08/17/2023.  Patient reports episodes of chest tightness radiating to right arm similar to pain she had with STEMI.  Cardiac PET/CT*** -Continue aspirin, Plavix, atorvastatin, carvedilol, as needed SL NTG.    Chronic diastolic heart failure/chronic lower extremity edema Echo April 2025 showed normal LV function, no RWMA, normal diastolic parameters, low normal RV function, normal RV size, moderately elevated PA pressure, mild LAE, mild MR, mild to moderate TR.  Patient*** Euvolemic and well compensated on exam.  -Continue carvedilol.    PE CTA chest showed right upper lobe segmental and subsegmental PE November 2024.  Repeat CTA chest was negative for PE.  -***   Hypertension BP today*** -Continue carvedilol   Hyperlipidemia LDL February 2025 64, at goal. -Continue atorvastatin.   Subdural hematoma/Seizure/Memory Loss Patient reports falling at a store in [facility] in November and sustaining a subdural hematoma. She had a seizure while in the hospital. She reports memory deficits since that time. Throughout her visit she has difficulty recalling details and events including onset of symptoms.*** -Refer to neurology.   ROS: All other systems reviewed and are otherwise negative except as noted in History of Present Illness.  EKGs/Labs Reviewed        08/17/2023: ALT 25; AST 39 08/28/2023: BUN 12; Creatinine, Ser 0.93; Potassium 4.5; Sodium 142   08/28/2023: Hemoglobin 11.8; WBC 9.5   No results found for requested labs within last 365 days.   No results found for requested  labs within last 365 days.  ***  Risk Assessment/Calculations    {Does this patient have ATRIAL FIBRILLATION?:[phone]} No BP recorded.  {Refresh Note OR Click here to enter BP  :1}***        Physical Exam    VS:  There were no vitals taken for this visit. , BMI There is no height or weight on file to calculate BMI.  GEN: Well nourished, well developed, in no acute distress. Neck: No JVD or carotid bruits. Cardiac: *** RRR. No murmurs. No rubs or gallops.   Respiratory:  Respirations regular and unlabored. Clear to auscultation without rales, wheezing or rhonchi. GI: Soft, nontender, nondistended. Extremities: Radials/DP/PT 2+ and equal bilaterally. No clubbing or cyanosis. No edema ***  Skin: Warm and dry, no rash. Neuro: Strength intact.  Assessment & Plan   ***  Disposition: ***     {Are you ordering a CV Procedure (e.g. stress test, cath, DCCV,  TEE, etc)?   Press F2        :[id number]}   Signed, Alicia Moyer. Abdurrahman Petersheim, DNP, NP-C

## 2024-01-12 ENCOUNTER — Encounter: Payer: Self-pay | Admitting: Oncology

## 2024-01-12 ENCOUNTER — Encounter (HOSPITAL_COMMUNITY): Payer: Self-pay

## 2024-01-13 ENCOUNTER — Telehealth: Payer: Self-pay

## 2024-01-13 NOTE — Telephone Encounter (Signed)
-----   Message from Rickard Patience sent at 01/12/2024 10:56 PM EDT ----- Please let patient know that CT showed no blood clot in her lungs.  Continue Aspirin and Plaix.  Follow up in 6 months. MD only. Thanks.

## 2024-01-13 NOTE — Telephone Encounter (Signed)
Ph note created   

## 2024-01-14 ENCOUNTER — Ambulatory Visit: Admission: RE | Admit: 2024-01-14 | Payer: Medicare Other | Source: Ambulatory Visit

## 2024-01-15 ENCOUNTER — Ambulatory Visit: Payer: Medicare Other | Admitting: Student

## 2024-01-15 ENCOUNTER — Encounter: Payer: Self-pay | Admitting: Student

## 2024-01-15 ENCOUNTER — Ambulatory Visit: Attending: Student | Admitting: Student

## 2024-01-15 VITALS — BP 142/82 | HR 53 | Ht 64.0 in | Wt 152.0 lb

## 2024-01-15 DIAGNOSIS — I25118 Atherosclerotic heart disease of native coronary artery with other forms of angina pectoris: Secondary | ICD-10-CM | POA: Diagnosis not present

## 2024-01-15 DIAGNOSIS — I5032 Chronic diastolic (congestive) heart failure: Secondary | ICD-10-CM | POA: Diagnosis not present

## 2024-01-15 DIAGNOSIS — Z86711 Personal history of pulmonary embolism: Secondary | ICD-10-CM

## 2024-01-15 DIAGNOSIS — R600 Localized edema: Secondary | ICD-10-CM

## 2024-01-15 DIAGNOSIS — E785 Hyperlipidemia, unspecified: Secondary | ICD-10-CM

## 2024-01-15 DIAGNOSIS — I10 Essential (primary) hypertension: Secondary | ICD-10-CM

## 2024-01-15 MED ORDER — FUROSEMIDE 20 MG PO TABS: 20.0000 mg | ORAL_TABLET | Freq: Every day | ORAL | 3 refills | Status: AC | PRN

## 2024-01-15 MED ORDER — CARVEDILOL 3.125 MG PO TABS: 3.1250 mg | ORAL_TABLET | Freq: Two times a day (BID) | ORAL | 3 refills | Status: DC

## 2024-01-15 NOTE — Patient Instructions (Signed)
 Medication Instructions:  Your physician recommends the following medication changes.  START TAKING: Carvedilol 3.125 mg twice daily Lasix 20 mg daily as needed for swelling in the lower legs  *If you need a refill on your cardiac medications before your next appointment, please call your pharmacy*  Lab Work: None ordered at this time   Testing/Procedures:   Please report to Radiology at [facility] Main [facility]trance, medical mall, 30 mins prior to your test.  [address]  [place], [place]  How to Prepare for Your Cardiac PET/CT Stress Test:  Nothing to eat or drink, except water, 3 hours prior to arrival time.  NO caffeine/decaffeinated products, or chocolate 12 hours prior to arrival. (Please note decaffeinated beverages (teas/coffees) still contain caffeine).  If you have caffeine within 12 hours prior, the test will need to be rescheduled.  Medication instructions: Do not take erectile dysfunction medications for 72 hours prior to test (sildenafil, tadalafil) Do not take nitrates (isosorbide mononitrate, Ranexa) the day before or day of test Do not take tamsulosin the day before or morning of test Hold theophylline containing medications for 12 hours. Hold Dipyridamole 48 hours prior to the test.  Diabetic Preparation: If able to eat breakfast prior to 3 hour fasting, you may take all medications, including your insulin. Do not worry if you miss your breakfast dose of insulin - start at your next meal. If you do not eat prior to 3 hour fast-Hold all diabetes (oral and insulin) medications. Patients who wear a continuous glucose monitor MUST remove the device prior to scanning.  You may take your remaining medications with water.  NO perfume, cologne or lotion on chest or abdomen area. FEMALES - Please avoid wearing dresses to this appointment.  Total time is 1 to 2 hours; you may want to bring reading material for the waiting time.  IF YOU  THINK YOU MAY BE PREGNANT, OR ARE NURSING PLEASE INFORM THE TECHNOLOGIST.  In preparation for your appointment, medication and supplies will be purchased.  Appointment availability is limited, so if you need to cancel or reschedule, please call the Radiology Department Scheduler at [phone] 24 hours in advance to avoid a cancellation fee of $100.00  What to Expect When you Arrive:  Once you arrive and check in for your appointment, you will be taken to a preparation room within the Radiology Department.  A technologist or Nurse will obtain your medical history, verify that you are correctly prepped for the exam, and explain the procedure.  Afterwards, an IV will be started in your arm and electrodes will be placed on your skin for EKG monitoring during the stress portion of the exam. Then you will be escorted to the PET/CT scanner.  There, staff will get you positioned on the scanner and obtain a blood pressure and EKG.  During the exam, you will continue to be connected to the EKG and blood pressure machines.  A small, safe amount of a radioactive tracer will be injected in your IV to obtain a series of pictures of your heart along with an injection of a stress agent.    After your Exam:  It is recommended that you eat a meal and drink a caffeinated beverage to counter act any effects of the stress agent.  Drink plenty of fluids for the remainder of the day and urinate frequently for the first couple of hours after the exam.  Your doctor will inform you of your test results within 7-10 business  days.  For more information and frequently asked questions, please visit our website: [email or web address]  For questions about your test or how to prepare for your test, please call: Cardiac Imaging Nurse Navigators Office: [phone]  Follow-Up: At [facility], you and your health needs are our priority.  As part of our continuing mission to provide you with exceptional  heart care, our providers are all part of one team.  This team includes your primary Cardiologist (physician) and Advanced Practice Providers or APPs (Physician Assistants and Nurse Practitioners) who all work together to provide you with the care you need, when you need it.  Your next appointment:   3 month(s)  Provider:   Debbe Odea, MD

## 2024-01-15 NOTE — Progress Notes (Signed)
 Cardiology Clinic Note   Date: 01/15/2024 ID: Alicia Moyer, DOB 1961/07/22, MRN [id number]  Primary Cardiologist:  Debbe Odea, MD  Chief Complaint   Alicia Moyer is a 63 y.o. female who presents to the clinic today for follow up after testing.   Patient Profile   Alicia Moyer is followed by Dr. Azucena Cecil for the history outlined below.      Past medical history significant for: CAD. LHC 08/17/2023 (STEMI): Proximal LAD 70% (ulcerative and heavily thrombotic).  Distal LAD #1 30%, #2 100% (thrombotic).  No angiographically significant CAD in LCx or RCA.  OCT guided PCI with DES to proximal LAD.  Preserved LVSF with apical akinesis.  Normal LV filling pressure. Chronic diastolic heart failure. Echo 01/05/2024: EF 60 to 65%.  No RWMA.  Normal diastolic parameters.  Low normal RV function, normal RV size.  Moderately elevated PA pressure, RVSP 43.8 mmHg.  Mild LAE.  Mild MR.  Mild to moderate TR. NSVT. Hypertension. Hyperlipidemia. LPa 08/18/2023: <8.4. Lipid panel 11/24/2023: LDL 64, HDL 46, TG 69, total 124. PE. CTA chest PE protocol 08/17/2023: Right upper lobe segmental and subsegmental PE.  No pulmonary infarction or right heart strain.  Small hiatal hernia. Chronic pain syndrome.  In summary, patient was previously seen by [facility] clinic cardiology in June and July 2019.  She presented to [facility] clinic cardiology secondary to her son being diagnosed with bicuspid valve.  At the time of her initial visit she reported a 1 to 2-year history of intermittent chest discomfort.  Echo at that time showed normal LV function with mild LVH, EF 55%, normal RV function, trivial MR, mild TR.  Nuclear stress test was a normal low risk study.  In March 2023 patient presented to [facility] ED for lower extremity edema.  BP 172/107.  EKG showed NSR with no acute changes.  BNP 194.2.  Patient was referred to heart failure clinic.  She was seen by the heart failure clinic on 12/31/2021.  She reported a  7 pound weight gain prior to ED visit.  She was restarted on losartan and prescribed Lasix and potassium.  Echo showed EF 60 to 65%, no RWMA, Grade II DD, mild MR.  Upon follow-up she was euvolemic and medications were continued.  She was referred to cardiology to establish care.  She was evaluated by Dr. Azucena Cecil on 01/24/2022.  She was stable at that time and no changes were made.   Patient was seen in the office on 08/28/2023 for hospital follow-up.  She was doing well at that time.  She reported a recent mechanical fall resulting in some bruising of her arm.  She did not hit her head or lose consciousness.  She was tolerating all of her medications well and no changes were made.   Patient was last seen in the office by me on 12/04/2023 for routine follow-up.  She was somewhat of a poor historian secondary to memory loss after subdural hematoma and seizure in November 2024.  She reported, chemical fall out of her rooms to go in [place] in which her initial head CT was negative.  She suffered a seizure and repeat CT showed a subdural hematoma.  She had recently returned to [facility] from [place].  She reported a 2-week history of increased lower extremity edema and reported possibly taking a short course of Lasix 2 weeks prior.  She was not weighing while in [place] as she did not have a scale there.  She reported significant weight loss secondary to not wanting to eat.  It was difficult to ascertain if she knew what her normal weight was or if she had had vague jumps of increased weight since being home.  She reported dyspnea that she had difficulty describing.  She also reported episodes of central chest tightness with radiation down right arm similar to when she had a heart attack.  It was unclear if chest discomfort occurred with or without exertion.  She reported having an IVC placed while in the hospital in [place].  She had completed her course of Eliquis and was back  to taking aspirin and Plavix.  Echo and PET/CT were ordered as above.  She was referred to neurology as she reported not seeing the neurologist since getting out of the hospital in [place].     History of Present Illness    Today, patient reports continued episodes of chest pain described as tightness across chest and radiating down bilateral arms. This pain is similar to pain prior to STEMI. Pain can occur with or without exertion. She had decided to cancel cardiac PET CT secondary secondary to hematologist stating she did not need the test, as it would not let her know why she developed a PE. Discussed with patient that the cardiac PET CT was ordered to evaluate her continued chest pain. Patient also reports palpitations that she feels particularly at night. She reports BP has been running high at home. When it gets high she has ringing in her ears. Upon further questioning she reports she is not taking carvedilol and is unsure when she stopped taking it. She reports occasional lower extremity edema. She reports a few weeks ago she went to sleep sitting up straight in her recliner and woke with severe lower extremity edema. She normally sleeps in her recliner but usually with her feet elevated. On this day she fell asleep prior to elevating her sleep. She states it took several days for the edema to resolve. She states when she was in [facility] she was provided with Lasix that she took as needed. She wonders if she can get prescription to take as needed. Her activity is limited secondary to hip pain with history of multiple hip replacements.       ROS: All other systems reviewed and are otherwise negative except as noted in History of Present Illness.  EKGs/Labs Reviewed        08/17/2023: ALT 25; AST 39 08/28/2023: BUN 12; Creatinine, Ser 0.93; Potassium 4.5; Sodium 142   08/28/2023: Hemoglobin 11.8; WBC 9.5    Risk Assessment/Calculations      HYPERTENSION CONTROL Vitals:   01/15/24  1540 01/15/24 1713  BP: (!) 142/84 (!) 142/82    The patient's blood pressure is elevated above target today.  In order to address the patient's elevated BP: A new medication was prescribed today.           Physical Exam    VS:  BP (!) 142/82 (BP Location: Left Arm, Patient Position: Sitting, Cuff Size: Normal)   Pulse (!) 53   Ht 5\' 4"  (1.626 m)   Wt 152 lb (68.9 kg)   SpO2 97%   BMI 26.09 kg/m  , BMI Body mass index is 26.09 kg/m.  GEN: Well nourished, well developed, in no acute distress. Neck: No JVD or carotid bruits. Cardiac:  RRR. No murmurs. No rubs or gallops.   Respiratory:  Respirations regular and unlabored. Clear to auscultation without rales, wheezing  or rhonchi. GI: Soft, nontender, nondistended. Extremities: Radials/DP/PT 2+ and equal bilaterally. No clubbing or cyanosis. No edema.  Skin: Warm and dry, no rash. Neuro: Strength intact.  Assessment & Plan   CAD S/p PCI with DES to proximal LAD 08/17/2023.  Patient reports episodes of chest tightness radiating to right arm similar to pain she had with STEMI.  She canceled cardiac PET CT secondary to being told she did not need to have the test. Discussed why the test was ordered. Discussed course of isosorbide vs proceeding with stress test. She agrees with having the stress test. Cardiac PET/CT will be reordered.  -Continue aspirin, Plavix, atorvastatin, carvedilol (see below), as needed SL NTG.  -Reschedule cardiac PET CT.    Chronic diastolic heart failure/chronic lower extremity edema Echo April 2025 showed normal LV function, no RWMA, normal diastolic parameters, low normal RV function, normal RV size, moderately elevated PA pressure, mild LAE, mild MR, mild to moderate TR.  Patient reports occasional lower extremity edema. One episode was particularly bad a few weeks ago when she fell asleep with her legs in a dependent position. It took several days to resolve. She requests Lasix prn to manage edema.   Euvolemic and well compensated on exam.  -Continue carvedilol (see below). -Rx Lasix 20 mg prn lower extremity edema.     PE CTA chest showed right upper lobe segmental and subsegmental PE November 2024.  Repeat CTA chest was negative for PE.  -Patient is being followed by hematology.    Hypertension BP today 142/84 on intake and 142/82 on recheck. She reports she has not taken carvedilol for an unknown period of time.  -Restart carvedilol 3.125 mg bid.    Hyperlipidemia LDL February 2025 64, at goal. -Continue atorvastatin.  Disposition: Reschedule PET CT. Restart carvedilol 3.125 mg bid. Lasix 20 mg prn lower extremity edema. Return in 3 months or sooner as needed.          Signed, Etta Grandchild. Jasamine Pottinger, DNP, NP-C

## 2024-02-19 ENCOUNTER — Ambulatory Visit: Attending: Nurse Practitioner | Admitting: Nurse Practitioner

## 2024-02-19 ENCOUNTER — Encounter: Payer: Self-pay | Admitting: Nurse Practitioner

## 2024-02-19 VITALS — BP 128/84 | HR 59 | Ht 64.0 in | Wt 153.0 lb

## 2024-02-19 DIAGNOSIS — R002 Palpitations: Secondary | ICD-10-CM | POA: Insufficient documentation

## 2024-02-19 DIAGNOSIS — I10 Essential (primary) hypertension: Secondary | ICD-10-CM

## 2024-02-19 DIAGNOSIS — I5032 Chronic diastolic (congestive) heart failure: Secondary | ICD-10-CM | POA: Diagnosis not present

## 2024-02-19 DIAGNOSIS — Z86711 Personal history of pulmonary embolism: Secondary | ICD-10-CM

## 2024-02-19 DIAGNOSIS — S065XAD Traumatic subdural hemorrhage with loss of consciousness status unknown, subsequent encounter: Secondary | ICD-10-CM | POA: Diagnosis not present

## 2024-02-19 DIAGNOSIS — S065XAA Traumatic subdural hemorrhage with loss of consciousness status unknown, initial encounter: Secondary | ICD-10-CM | POA: Insufficient documentation

## 2024-02-19 DIAGNOSIS — I25118 Atherosclerotic heart disease of native coronary artery with other forms of angina pectoris: Secondary | ICD-10-CM | POA: Diagnosis not present

## 2024-02-19 DIAGNOSIS — E785 Hyperlipidemia, unspecified: Secondary | ICD-10-CM

## 2024-02-19 MED ORDER — ATORVASTATIN CALCIUM 40 MG PO TABS: 40.0000 mg | ORAL_TABLET | Freq: Every day | ORAL | 3 refills | Status: DC

## 2024-02-19 MED ORDER — CLOPIDOGREL BISULFATE 75 MG PO TABS: 75.0000 mg | ORAL_TABLET | Freq: Every day | ORAL | 2 refills | Status: DC

## 2024-02-19 NOTE — Progress Notes (Signed)
 Office Visit    Patient Name: Alicia Moyer Date of Encounter: 02/19/2024  Primary Care Provider:  Melchor Spoon, MD Primary Cardiologist:  Constancia Delton, MD  Chief Complaint    63 y.o. female with a history of CAD status post inferior STEMI with LAD occlusion and stenting in November 2024, chronic HFpEF, pulmonary embolism (November 2024), hypertension, hyperlipidemia, nonsustained VT, chronic pain, and fibromyalgia, who presents for follow-up related to palpitations.  Past Medical History   Subjective   Past Medical History:  Diagnosis Date   Arthritis    CAD (coronary artery disease)    a. 08/2024 Inferior STEMI/PCI:  LOM nl, LAD 70p (4.0x15 Onyx Frontier DES), 30d, 100d (embolized thrombus), LCX nl, RCA nl, EF 55-65%   Chronic heart failure with preserved ejection fraction (HFpEF) (HCC)    a. 01/2024 Echo: EF 60-65%, no rwma, GrI DD, nl RV, RVSP 32.4 mmHg. Mild MR. Mild to mod TR.   Chronic pain    CSF abnormal 07/15/2014   Fibromyalgia 07/15/2014   Hyperlipidemia LDL goal <70    Hypertension    Memory loss    Myocardial infarction [facility])    Palpitations    Pulmonary embolism (HCC)    a.  08/2023 CTA Chest: Right upper lobe segmental and subsegmental PE-->Eliquis  followed by IVC filter in [place] when Eliquis  d/c in 08/2023 due to SDH.   Scoliosis    Subdural hematoma (HCC)    a. 08/2023 Fall-->SDH-->Eliquis  D/c'd.  Asa/Plavix  held x 2 wks.   Past Surgical History:  Procedure Laterality Date   ABDOMINAL HYSTERECTOMY  2000   CORONARY IMAGING/OCT N/A 08/17/2023   Procedure: CORONARY IMAGING/OCT;  Surgeon: Sammy Crisp, MD;  Location: [facility] INVASIVE CV LAB;  Service: Cardiovascular;  Laterality: N/A;   CORONARY/GRAFT ACUTE MI REVASCULARIZATION N/A 08/17/2023   Procedure: Coronary/Graft Acute MI Revascularization;  Surgeon: Sammy Crisp, MD;  Location: [facility] INVASIVE CV LAB;  Service: Cardiovascular;  Laterality: N/A;   DILATION AND CURETTAGE OF UTERUS     Hip  replacement right     x6   LEFT HEART CATH AND CORONARY ANGIOGRAPHY N/A 08/17/2023   Procedure: LEFT HEART CATH AND CORONARY ANGIOGRAPHY;  Surgeon: Sammy Crisp, MD;  Location: [facility] INVASIVE CV LAB;  Service: Cardiovascular;  Laterality: N/A;    Allergies  Allergies  Allergen Reactions   Clindamycin Anaphylaxis and Itching    Other Reaction(s): Not available  Rash,swelling of face and neck   Clindamycin/Lincomycin Anaphylaxis    Rash,swelling of face and neck   Zolpidem Other (See Comments)       History of Present Illness      63 y.o. y/o female with a history of CAD, chronic HFpEF, PE, hypertension, hyperlipidemia, nonsustained VT, chronic pain, and fibromyalgia.  She was previously followed at [facility] clinic, establishing care in December 2019 due to history of chest pain and a son with bicuspid aortic valve.  Echo at that time showed normal LV function with trivial MR.  Stress testing was low risk.  She was subsequently admitted in March 2023 with HFpEF with echo showing normal LV function and grade 2 diastolic dysfunction, along with mild MR.  She required initiation of diuretic therapy.  She subsequently established care with Dr. Junnie Olives in April 2023.  In November 2024, patient presented to [facility] regional with chest pain and subtle inferior ST segment elevation.  Emergent diagnostic catheterization revealed severe thrombotic proximal LAD disease with distal occlusion of the LAD in the setting of thrombotic emboli.  The proximal LAD was felt to be a culprit vessel and was successfully treated with a drug-eluting stent.  Echo during hospitalization again showed normal LV function.  She was also found to have a RUL PE and was placed on Eliquis .   She then required admission in [place]  after a mechanical fall.  Initial head CT was negative but she subsequently suffered a seizure with repeat head CT showing subdural hematoma.  Per patient, Eliquis  was discontinued and  IVC filter was placed.  She held aspirin  and Plavix  for 2 weeks and subsequently resumed.   At office follow-up in February 2025, it was noted that due to memory loss following subdural hematoma, she was a poor historian.  She complained of intermittent chest tightness and lower extremity edema.  Repeat echo in April 2025 showed EF of 60 to 65% with normal RV function, RVSP 43 mmHG, mild MR and mild-moderate TR.  Lexiscan PET stress was ordered but patient did not follow through.  At follow-up in April, she agreed to reschedule stress testing however, it is not scheduled until July.  Patient contacted our office yesterday with complaints of palpitations felt as a quivering in her chest.  She has a Kardia mobile device and used it frequently throughout the day.  I reviewed the strips today.  The vast majority are sinus rhythm with artifact though there is one strip that has occasional PACs.  She is on carvedilol  though initially told her she was not taking it.  On further questioning, she believes she is taking it.  Regarding her chest discomfort, she continues to complain of mild to moderate left-sided chest discomfort with left arm discomfort occurring about once a week, typically at rest, lasting about 30 minutes, resolving after taking an aspirin .  She has nitroglycerin  but has never used it.  She denies dyspnea, PND, orthopnea, dizziness, syncope, or early satiety.  She sometimes has lower extremity swelling for which she will use a as needed Lasix .  Objective   Home Medications    Current Outpatient Medications  Medication Sig Dispense Refill   aspirin  EC 81 MG tablet Take 81 mg by mouth daily. Swallow whole.     baclofen (LIORESAL) 10 MG tablet Take 10 mg by mouth 3 (three) times daily.     carvedilol  (COREG ) 3.125 MG tablet Take 1 tablet (3.125 mg total) by mouth 2 (two) times daily. 180 tablet 3   furosemide  (LASIX ) 20 MG tablet Take 1 tablet (20 mg total) by mouth daily as needed for edema  (swelling in lower legs). 90 tablet 3   gabapentin  (NEURONTIN ) 600 MG tablet Take 1 tablet (600 mg total) by mouth 2 (two) times daily.     nitroGLYCERIN  (NITROSTAT ) 0.4 MG SL tablet Place 1 tablet (0.4 mg total) under the tongue every 5 (five) minutes as needed for chest pain. 25 tablet 3   ondansetron  (ZOFRAN -ODT) 4 MG disintegrating tablet Take by mouth.     oxyCODONE  ER (XTAMPZA  ER) 27 MG C12A Take by mouth.     Oxycodone  HCl 10 MG TABS Take 1 tablet by mouth 4 (four) times daily as needed (pain).     Oxycodone  HCl 10 MG TABS Take 10 mg by mouth in the morning, at noon, in the evening, and at bedtime.     pantoprazole  (PROTONIX ) 20 MG tablet Take 1 tablet (20 mg total) by mouth daily. 30 tablet 0   traMADol  (ULTRAM -ER) 200 MG 24 hr tablet Take 200 mg by mouth daily.  atorvastatin  (LIPITOR) 40 MG tablet Take 1 tablet (40 mg total) by mouth daily. 90 tablet 3   clopidogrel  (PLAVIX ) 75 MG tablet Take 1 tablet (75 mg total) by mouth daily. 90 tablet 2   No current facility-administered medications for this visit.     Physical Exam    VS:  BP 128/84 (BP Location: Right Arm, Patient Position: Sitting, Cuff Size: Normal)   Pulse (!) 59   Ht 5\' 4"  (1.626 m)   Wt 153 lb (69.4 kg)   SpO2 96%   BMI 26.26 kg/m  , BMI Body mass index is 26.26 kg/m.       GEN: Well nourished, well developed, in no acute distress. HEENT: normal. Neck: Supple, no JVD, carotid bruits, or masses. Cardiac: RRR, no murmurs, rubs, or gallops. No clubbing, cyanosis, trace bilateral ankle edema.  Radials 2+/PT 2+ and equal bilaterally.  Respiratory:  Respirations regular and unlabored, clear to auscultation bilaterally. GI: Soft, nontender, nondistended, BS + x 4. MS: no deformity or atrophy. Skin: warm and dry, no rash. Neuro:  Strength and sensation are intact. Psych: Normal affect.  Accessory Clinical Findings     Lab Results  Component Value Date   WBC 9.5 08/28/2023   HGB 11.8 08/28/2023   HCT 36.9  08/28/2023   MCV 94 08/28/2023   PLT 384 08/28/2023   Lab Results  Component Value Date   CREATININE 0.93 08/28/2023   BUN 12 08/28/2023   NA 142 08/28/2023   K 4.5 08/28/2023   CL 105 08/28/2023   CO2 25 08/28/2023   Lab Results  Component Value Date   ALT 25 08/17/2023   AST 39 08/17/2023   ALKPHOS 72 08/17/2023   BILITOT 0.6 08/17/2023   Lab Results  Component Value Date   CHOL 116 08/17/2023   HDL 52 08/17/2023   LDLCALC 53 08/17/2023   TRIG 54 08/17/2023   CHOLHDL 2.2 08/17/2023    Lab Results  Component Value Date   HGBA1C 5.4 08/17/2023   Lab Results  Component Value Date   TSH 1.527 12/25/2021       Assessment & Plan    1.  Palpitations: Patient presents with a several day history of intermittent palpitations described as "heart quivering."  She says it persisted throughout the day yesterday and was very bothersome.  She does have a Kardia mobile device and checked her heart rhythm throughout the day.  I was able to review the strips on her phone and is notable that she checks her heart rhythm frequently throughout most days dating back several months.  I was able to review multiple strips over the past week, the vast majority showing normal sinus rhythm.  She did have 1 strip that showed sinus rhythm with PACs however.  Reassurance provided.  We did discuss potentially pursuing event monitoring however, mutually agreed that considering she captured rhythms during active episodes, that additional monitoring would likely be low yield.  She is unsure if she has been taking her carvedilol  as prescribed and I strongly encouraged her to take it as beta-blocker therapy will help in the setting of palpitations.  I will follow-up a basic metabolic panel, magnesium, and TSH today.  2.  Coronary artery disease/precordial pain: Status post myocardial infarction in November 2024 with severe LAD disease which was successfully treated with a drug-eluting stent.  The apical LAD was  occluded.  Since February, she has complained of intermittent midsternal chest discomfort and left arm pain associated with dyspnea, occurring  about once a week, lasting about 30 minutes, resolving after taking aspirin  (sometimes multiple aspirins).  She has not used nitrates.  She is scheduled for Lexiscan PET/CT but not until July.  She had previously canceled this resulting in the delay.  We were able to move this up to June 3 for her.  She says she may still cancel.  She is on aspirin  and possibly beta-blocker (as above, she was not entirely sure).  She says she is taking Plavix  but it I do not see that this has been refilled since her index hospitalization in November 2024.  I am refilling today.  I am also refilling her atorvastatin  as she says she was taken off of it at some point but she does not know when or why.  3.  Primary hypertension: Blood pressure stable today at 128/84.  She thinks she is taking her carvedilol  and I encouraged her to continue.  4.  Hyperlipidemia: LDL was 53 in November 2024.  At that time, she was on atorvastatin .  She says at some point it was discontinued but she is not sure when or why.  I encouraged her to resume and sent in a prescription for.  5.  History of subdural hematoma/memory loss: Subdural hematoma November 2024 in the setting of mechanical fall and antiplatelet/Eliquis  therapy.  Eliquis  was discontinued at that time.  She has had memory loss and ever since that event.  6.  History of PE: Right upper lobe segmental and subsegmental PE noted on CTA chest during admission November 2024.  She was initially on Eliquis  which was DC'd following fall and subdural hematoma.  Per patient, she has an IVC filter which was placed in [place] .  She has been seen by hematology with follow-up CTA chest in April 2025 showing no definite evidence of PE.  She was advised to continue aspirin  and Plavix  and follow-up with hematology in 6 months.  7.  Chronic HFpEF: Trace  ankle edema today.  She uses Lasix  as needed and says that if she sits for long period of time she will often have to take Lasix  the following day.  She does not typically experience dyspnea on exertion.  Heart rate and blood pressure stable.  No changes today.  8.  Disposition: Follow-up basic metabolic panel, magnesium, and TSH.  We are rescheduling her Lexiscan PET stress for a sooner date.  Follow-up in cardiology clinic in a few months as previously scheduled.  Laneta Pintos, NP 02/19/2024, 9:55 AM

## 2024-02-19 NOTE — Patient Instructions (Addendum)
 Medication Instructions:  Your physician has recommended you make the following change in your medication:   -Restart atorvastatin  (lipitor) 40mg  once daily.  *If you need a refill on your cardiac medications before your next appointment, please call your pharmacy*  Lab Work: Your provider recommends that you have labs drawn today: BMET, Magnesium, TSH  If you have labs (blood work) drawn today and your tests are completely normal, you will receive your results only by: MyChart Message (if you have MyChart) OR A paper copy in the mail If you have any lab test that is abnormal or we need to change your treatment, we will call you to review the results.   Follow-Up: At [facility], you and your health needs are our priority.  As part of our continuing mission to provide you with exceptional heart care, our providers are all part of one team.  This team includes your primary Cardiologist (physician) and Advanced Practice Providers or APPs (Physician Assistants and Nurse Practitioners) who all work together to provide you with the care you need, when you need it.  Your next appointment:   Friday, July 11th @ 11:20am  Provider:   Dr. Carl CharnleyDebra Familia  We recommend signing up for the patient portal called "MyChart".  Sign up information is provided on this After Visit Summary.  MyChart is used to connect with patients for Virtual Visits (Telemedicine).  Patients are able to view lab/test results, encounter notes, upcoming appointments, etc.  Non-urgent messages can be sent to your provider as well.   To learn more about what you can do with MyChart, go to [email or web address].

## 2024-02-20 LAB — BASIC METABOLIC PANEL WITH GFR
BUN/Creatinine Ratio: 16 (ref 12–28)
BUN: 16 mg/dL (ref 8–27)
CO2: 22 mmol/L (ref 20–29)
Calcium: 9.7 mg/dL (ref 8.7–10.3)
Chloride: 104 mmol/L (ref 96–106)
Creatinine, Ser: 1 mg/dL (ref 0.57–1.00)
Glucose: 124 mg/dL — ABNORMAL HIGH (ref 70–99)
Potassium: 4.7 mmol/L (ref 3.5–5.2)
Sodium: 141 mmol/L (ref 134–144)
eGFR: 64 mL/min/{1.73_m2} (ref >59)

## 2024-02-20 LAB — TSH: TSH: 4.01 u[IU]/mL (ref 0.450–4.500)

## 2024-02-20 LAB — MAGNESIUM: Magnesium: 2.1 mg/dL (ref 1.6–2.3)

## 2024-02-22 ENCOUNTER — Ambulatory Visit: Payer: Self-pay | Admitting: Nurse Practitioner

## 2024-03-06 ENCOUNTER — Encounter (HOSPITAL_COMMUNITY): Payer: Self-pay

## 2024-03-08 ENCOUNTER — Telehealth: Payer: Self-pay | Admitting: Emergency Medicine

## 2024-03-08 ENCOUNTER — Ambulatory Visit (HOSPITAL_COMMUNITY): Admission: RE | Admit: 2024-03-08 | Source: Ambulatory Visit

## 2024-03-08 NOTE — Telephone Encounter (Signed)
 Called patient's cell phone and left message (per DPR) asking to call back regarding scheduling PET or a Lexiscan.

## 2024-04-13 ENCOUNTER — Other Ambulatory Visit (HOSPITAL_COMMUNITY)

## 2024-04-15 ENCOUNTER — Encounter: Payer: Self-pay | Admitting: Cardiology

## 2024-04-15 ENCOUNTER — Ambulatory Visit: Attending: Cardiology | Admitting: Cardiology

## 2024-04-15 VITALS — BP 124/60 | HR 62 | Ht 64.0 in | Wt 154.8 lb

## 2024-04-15 DIAGNOSIS — R079 Chest pain, unspecified: Secondary | ICD-10-CM

## 2024-04-15 DIAGNOSIS — I2510 Atherosclerotic heart disease of native coronary artery without angina pectoris: Secondary | ICD-10-CM | POA: Diagnosis not present

## 2024-04-15 DIAGNOSIS — I10 Essential (primary) hypertension: Secondary | ICD-10-CM | POA: Diagnosis not present

## 2024-04-15 MED ORDER — ISOSORBIDE MONONITRATE ER 30 MG PO TB24: 15.0000 mg | ORAL_TABLET | Freq: Every day | ORAL | 3 refills | Status: DC

## 2024-04-15 NOTE — Patient Instructions (Signed)
 Medication Instructions:  Your physician recommends the following medication changes.  START TAKING: Imdur  15 mg by mouth daily   *If you need a refill on your cardiac medications before your next appointment, please call your pharmacy*  Lab Work: No labs ordered today    Testing/Procedures:  Please report to Radiology at [facility] Main Entrance, medical mall, 30 mins prior to your test.  [address]  [place], [place]  How to Prepare for Your Cardiac PET/CT Stress Test:  Nothing to eat or drink, except water, 3 hours prior to arrival time.  NO caffeine /decaffeinated products, or chocolate 12 hours prior to arrival. (Please note decaffeinated beverages (teas/coffees) still contain caffeine ).  If you have caffeine  within 12 hours prior, the test will need to be rescheduled.  Medication instructions: Do not take nitrates (IMDUR ) the day before or day of test    You may take your remaining medications with water.  NO perfume, cologne or lotion on chest or abdomen area. FEMALES - Please avoid wearing dresses to this appointment.  Total time is 1 to 2 hours; you may want to bring reading material for the waiting time.  IF YOU THINK YOU MAY BE PREGNANT, OR ARE NURSING PLEASE INFORM THE TECHNOLOGIST.  In preparation for your appointment, medication and supplies will be purchased.  Appointment availability is limited, so if you need to cancel or reschedule, please call the Radiology Department Scheduler at [phone] 24 hours in advance to avoid a cancellation fee of $100.00  What to Expect When you Arrive:  Once you arrive and check in for your appointment, you will be taken to a preparation room within the Radiology Department.  A technologist or Nurse will obtain your medical history, verify that you are correctly prepped for the exam, and explain the procedure.  Afterwards, an IV will be started in your arm and electrodes will be placed on your  skin for EKG monitoring during the stress portion of the exam. Then you will be escorted to the PET/CT scanner.  There, staff will get you positioned on the scanner and obtain a blood pressure and EKG.  During the exam, you will continue to be connected to the EKG and blood pressure machines.  A small, safe amount of a radioactive tracer will be injected in your IV to obtain a series of pictures of your heart along with an injection of a stress agent.    After your Exam:  It is recommended that you eat a meal and drink a caffeinated beverage to counter act any effects of the stress agent.  Drink plenty of fluids for the remainder of the day and urinate frequently for the first couple of hours after the exam.  Your doctor will inform you of your test results within 7-10 business days.  For more information and frequently asked questions, please visit our website: [email or web address]  For questions about your test or how to prepare for your test, please call: Cardiac Imaging Nurse Navigators Office: [phone]   Follow-Up: At [facility], you and your health needs are our priority.  As part of our continuing mission to provide you with exceptional heart care, our providers are all part of one team.  This team includes your primary Cardiologist (physician) and Advanced Practice Providers or APPs (Physician Assistants and Nurse Practitioners) who all work together to provide you with the care you need, when you need it.  Your next appointment:   3 month(s)  Provider:  You may see Redell Cave, MD or one of the following Advanced Practice Providers on your designated Care Team:   Lonni Meager, NP

## 2024-04-15 NOTE — Progress Notes (Signed)
 Cardiology Office Note:    Date:  04/15/2024   ID:  Alicia Moyer, DOB 28-Oct-1960, MRN [id number]  PCP:  [name], MD   [facility] Providers Cardiologist:  Redell Cave, MD     Referring MD: [name], MD   Chief Complaint  Patient presents with   Follow-up    5 month follow up pt has been doing well has complaints of chest pain,  no chest pressure or SOB, medciation reviewed verbally with patient    History of Present Illness:    Alicia Moyer is a 63 y.o. female with a hx of CAD (s/p PCI proximal LAD 08/2023, distal LAD 100%), HFpEF, hypertension, presenting for follow-up.  States having occasional chest pain not associated with exertion, relieved with nitroglycerin .  A cardiac PET was ordered, but not performed because patient went to the wrong location.  Blood pressures are adequately controlled.  Overall feels fatigued, has pelvic pressure, recovering from the urinary tract infection.  Has appointment with urology upcoming.  She is compliant with her medicines as prescribed.  Prior notes/testing Echo 01/2024 EF 60 to 65% Echo 01/2022 EF 60 to 65%, diastolic function normal. 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  Subsequently underwent right hip replacement in adulthood.  Her right hip unfortunately fractured leading to chronic pain.  Has seen multiple orthopedic specialist and was told there is nothing to do.  Takes Norco chronically for pain.    Past Medical History:  Diagnosis Date   Arthritis    CAD (coronary artery disease)    a. 08/2024 Inferior STEMI/PCI:  LOM nl, LAD 70p (4.0x15 Onyx Frontier DES), 30d, 100d (embolized thrombus), LCX nl, RCA nl, EF 55-65%   Chronic heart failure with preserved ejection fraction (HFpEF) (HCC)    a. 01/2024 Echo: EF 60-65%, no rwma, GrI DD, nl RV, RVSP 32.4 mmHg. Mild MR. Mild to mod TR.   Chronic pain    CSF abnormal 07/15/2014   Fibromyalgia 07/15/2014   Hyperlipidemia LDL goal <70    Hypertension    Memory loss     Myocardial infarction [facility])    Palpitations    Pulmonary embolism (HCC)    a.  08/2023 CTA Chest: Right upper lobe segmental and subsegmental PE-->Eliquis  followed by IVC filter in [place] when Eliquis  d/c in 08/2023 due to SDH.   Scoliosis    Subdural hematoma (HCC)    a. 08/2023 Fall-->SDH-->Eliquis  D/c'd.  Asa/Plavix  held x 2 wks.    Past Surgical History:  Procedure Laterality Date   ABDOMINAL HYSTERECTOMY  2000   CORONARY IMAGING/OCT N/A 08/17/2023   Procedure: CORONARY IMAGING/OCT;  Surgeon: [name], MD;  Location: [facility] INVASIVE CV LAB;  Service: Cardiovascular;  Laterality: N/A;   CORONARY/GRAFT ACUTE MI REVASCULARIZATION N/A 08/17/2023   Procedure: Coronary/Graft Acute MI Revascularization;  Surgeon: [name], MD;  Location: [facility] INVASIVE CV LAB;  Service: Cardiovascular;  Laterality: N/A;   DILATION AND CURETTAGE OF UTERUS     Hip replacement right     x6   LEFT HEART CATH AND CORONARY ANGIOGRAPHY N/A 08/17/2023   Procedure: LEFT HEART CATH AND CORONARY ANGIOGRAPHY;  Surgeon: [name], MD;  Location: [facility] INVASIVE CV LAB;  Service: Cardiovascular;  Laterality: N/A;    Current Medications: Current Meds  Medication Sig   aspirin  EC 81 MG tablet Take 81 mg by mouth daily. Swallow whole.   atorvastatin  (LIPITOR) 40 MG tablet Take 1 tablet (40 mg total) by mouth daily.   baclofen (  LIORESAL) 10 MG tablet Take 10 mg by mouth 3 (three) times daily.   carvedilol  (COREG ) 3.125 MG tablet Take 1 tablet (3.125 mg total) by mouth 2 (two) times daily.   clopidogrel  (PLAVIX ) 75 MG tablet Take 1 tablet (75 mg total) by mouth daily.   furosemide  (LASIX ) 20 MG tablet Take 1 tablet (20 mg total) by mouth daily as needed for edema (swelling in lower legs).   gabapentin  (NEURONTIN ) 600 MG tablet Take 1 tablet (600 mg total) by mouth 2 (two) times daily.   isosorbide  mononitrate (IMDUR ) 30 MG 24 hr tablet Take 0.5 tablets (15 mg total) by mouth daily.   nitroGLYCERIN   (NITROSTAT ) 0.4 MG SL tablet Place 1 tablet (0.4 mg total) under the tongue every 5 (five) minutes as needed for chest pain.   ondansetron  (ZOFRAN -ODT) 4 MG disintegrating tablet Take by mouth.   oxyCODONE  ER (XTAMPZA  ER) 27 MG C12A Take by mouth.   Oxycodone  HCl 10 MG TABS Take 10 mg by mouth in the morning, at noon, in the evening, and at bedtime.   pantoprazole  (PROTONIX ) 20 MG tablet Take 1 tablet (20 mg total) by mouth daily.     Allergies:   Clindamycin, Clindamycin/lincomycin, and Zolpidem   Social History   Socioeconomic History   Marital status: Married    Spouse name: Not on file   Number of children: Not on file   Years of education: Not on file   Highest education level: Not on file  Occupational History   Not on file  Tobacco Use   Smoking status: Never   Smokeless tobacco: Not on file  Vaping Use   Vaping status: Never Used  Substance and Sexual Activity   Alcohol use: Never   Drug use: Never   Sexual activity: Not on file  Other Topics Concern   Not on file  Social History Narrative   Not on file   Social Drivers of Health   Financial Resource Strain: Low Risk  (06/03/2023)   Received from [facility]   Overall Financial Resource Strain (CARDIA)    Difficulty of Paying Living Expenses: Not hard at all  Food Insecurity: Food Insecurity Present (12/09/2023)   Hunger Vital Sign    Worried About Running Out of Food in the Last Year: Sometimes true    Ran Out of Food in the Last Year: Sometimes true  Transportation Needs: Unmet Transportation Needs (12/09/2023)   PRAPARE - Administrator, Civil Service (Medical): Yes    Lack of Transportation (Non-Medical): Yes  Physical Activity: Not on file  Stress: Not on file  Social Connections: Not on file     Family History: The patient's family history includes Aneurysm in an other family member; Cancer in her father.  ROS:   Please see the history of present illness.     All other  systems reviewed and are negative.  EKGs/Labs/Other Studies Reviewed:    The following studies were reviewed today:   Recent Labs: 08/17/2023: ALT 25 08/28/2023: Hemoglobin 11.8; Platelets 384 02/19/2024: BUN 16; Creatinine, Ser 1.00; Magnesium 2.1; Potassium 4.7; Sodium 141; TSH 4.010  Recent Lipid Panel    Component Value Date/Time   CHOL 116 08/17/2023 2138   TRIG 54 08/17/2023 2138   HDL 52 08/17/2023 2138   CHOLHDL 2.2 08/17/2023 2138   VLDL 11 08/17/2023 2138   LDLCALC 53 08/17/2023 2138     Risk Assessment/Calculations:          Physical Exam:  VS:  BP 124/60 (BP Location: Left Arm, Patient Position: Sitting, Cuff Size: Normal)   Pulse 62   Ht 5' 4 (1.626 m)   Wt 154 lb 12.8 oz (70.2 kg)   SpO2 99%   BMI 26.57 kg/m     Wt Readings from Last 3 Encounters:  04/15/24 154 lb 12.8 oz (70.2 kg)  02/19/24 153 lb (69.4 kg)  01/15/24 152 lb (68.9 kg)     GEN:  Well nourished, well developed in no acute distress HEENT: Normal NECK: No JVD; No carotid bruits CARDIAC: RRR, no murmurs, rubs, gallops RESPIRATORY:  Clear to auscultation without rales, wheezing or rhonchi  ABDOMEN: Soft, non-tender, non-distended MUSCULOSKELETAL:  No edema; No deformity  SKIN: Warm and dry NEUROLOGIC:  Alert and oriented x 3 PSYCHIATRIC:  Normal affect   ASSESSMENT:    1. Coronary artery disease involving native coronary artery of native heart, unspecified whether angina present   2. Primary hypertension   3. Chest pain, unspecified type    PLAN:    In order of problems listed above:  CAD s/p PCI to proximal LAD 08/2023, distal LAD 100% occluded.  Occasional chest pain.  Start Imdur  15 mg daily, continue Coreg , continue aspirin , Plavix , Lipitor 40 mg daily.  Obtain cardiac PET.  EF 60 to 65%. Hypertension, BP controlled.  Continue Coreg  3.125 mg twice daily, Imdur  as above.  Follow-up in 3 months  Informed Consent   Shared Decision Making/Informed Consent The risks  [chest pain, shortness of breath, cardiac arrhythmias, dizziness, blood pressure fluctuations, myocardial infarction, stroke/transient ischemic attack, nausea, vomiting, allergic reaction, radiation exposure, metallic taste sensation and life-threatening complications (estimated to be 1 in 10,000)], benefits (risk stratification, diagnosing coronary artery disease, treatment guidance) and alternatives of a cardiac PET stress test were discussed in detail with Ms. Maclay and she agrees to proceed.          Medication Adjustments/Labs and Tests Ordered: Current medicines are reviewed at length with the patient today.  Concerns regarding medicines are outlined above.  Orders Placed This Encounter  Procedures   NM PET CT CARDIAC PERFUSION MULTI W/ABSOLUTE BLOODFLOW   EKG 12-Lead   Meds ordered this encounter  Medications   isosorbide  mononitrate (IMDUR ) 30 MG 24 hr tablet    Sig: Take 0.5 tablets (15 mg total) by mouth daily.    Dispense:  45 tablet    Refill:  3    Patient Instructions  Medication Instructions:  Your physician recommends the following medication changes.  START TAKING: Imdur  15 mg by mouth daily   *If you need a refill on your cardiac medications before your next appointment, please call your pharmacy*  Lab Work: No labs ordered today    Testing/Procedures:  Please report to Radiology at [facility] Main Entrance, medical mall, 30 mins prior to your test.  [address]  [place], [place]  How to Prepare for Your Cardiac PET/CT Stress Test:  Nothing to eat or drink, except water, 3 hours prior to arrival time.  NO caffeine /decaffeinated products, or chocolate 12 hours prior to arrival. (Please note decaffeinated beverages (teas/coffees) still contain caffeine ).  If you have caffeine  within 12 hours prior, the test will need to be rescheduled.  Medication instructions: Do not take nitrates (IMDUR ) the day before or day of test     You may take your remaining medications with water.  NO perfume, cologne or lotion on chest or abdomen area. FEMALES - Please avoid wearing dresses to  this appointment.  Total time is 1 to 2 hours; you may want to bring reading material for the waiting time.  IF YOU THINK YOU MAY BE PREGNANT, OR ARE NURSING PLEASE INFORM THE TECHNOLOGIST.  In preparation for your appointment, medication and supplies will be purchased.  Appointment availability is limited, so if you need to cancel or reschedule, please call the Radiology Department Scheduler at [phone] 24 hours in advance to avoid a cancellation fee of $100.00  What to Expect When you Arrive:  Once you arrive and check in for your appointment, you will be taken to a preparation room within the Radiology Department.  A technologist or Nurse will obtain your medical history, verify that you are correctly prepped for the exam, and explain the procedure.  Afterwards, an IV will be started in your arm and electrodes will be placed on your skin for EKG monitoring during the stress portion of the exam. Then you will be escorted to the PET/CT scanner.  There, staff will get you positioned on the scanner and obtain a blood pressure and EKG.  During the exam, you will continue to be connected to the EKG and blood pressure machines.  A small, safe amount of a radioactive tracer will be injected in your IV to obtain a series of pictures of your heart along with an injection of a stress agent.    After your Exam:  It is recommended that you eat a meal and drink a caffeinated beverage to counter act any effects of the stress agent.  Drink plenty of fluids for the remainder of the day and urinate frequently for the first couple of hours after the exam.  Your doctor will inform you of your test results within 7-10 business days.  For more information and frequently asked questions, please visit our website: [email or web address]  For  questions about your test or how to prepare for your test, please call: Cardiac Imaging Nurse Navigators Office: [phone]   Follow-Up: At [facility], you and your health needs are our priority.  As part of our continuing mission to provide you with exceptional heart care, our providers are all part of one team.  This team includes your primary Cardiologist (physician) and Advanced Practice Providers or APPs (Physician Assistants and Nurse Practitioners) who all work together to provide you with the care you need, when you need it.  Your next appointment:   3 month(s)  Provider:   You may see Redell Cave, MD or one of the following Advanced Practice Providers on your designated Care Team:   Lonni Meager, NP      Signed, Redell Cave, MD  04/15/2024 12:32 PM     [facility]

## 2024-05-03 ENCOUNTER — Ambulatory Visit (INDEPENDENT_AMBULATORY_CARE_PROVIDER_SITE_OTHER): Payer: Self-pay | Admitting: Vascular Surgery

## 2024-05-03 ENCOUNTER — Encounter (INDEPENDENT_AMBULATORY_CARE_PROVIDER_SITE_OTHER): Payer: Self-pay | Admitting: Vascular Surgery

## 2024-05-03 VITALS — BP 121/87 | HR 50 | Ht 64.0 in | Wt 157.1 lb

## 2024-05-03 DIAGNOSIS — I10 Essential (primary) hypertension: Secondary | ICD-10-CM

## 2024-05-03 DIAGNOSIS — S065XAA Traumatic subdural hemorrhage with loss of consciousness status unknown, initial encounter: Secondary | ICD-10-CM

## 2024-05-03 DIAGNOSIS — E785 Hyperlipidemia, unspecified: Secondary | ICD-10-CM | POA: Diagnosis not present

## 2024-05-03 DIAGNOSIS — Z95828 Presence of other vascular implants and grafts: Secondary | ICD-10-CM | POA: Insufficient documentation

## 2024-05-03 NOTE — Progress Notes (Signed)
 Patient ID: Alicia Moyer, female   DOB: 12/12/1960, 63 y.o.   MRN: [id number]  Chief Complaint  Patient presents with   np. consult. presence of IVC filter. klein, bert.    HPI Alicia Moyer is a 63 y.o. female.  I am asked to see the patient by Dr. Fernande for evaluation of her IVC filter and consideration for removal.  This was placed about 8 months ago when she had an intracranial hemorrhage requiring cessation of anticoagulation and she was found to have small pulmonary embolus.  She has a fairly complicated past medical history with previous heart attack, intracranial hemorrhage, had coronary intervention last year and remains on aspirin  and Plavix .  She also has a history of a major bladder surgery and is apparently scheduled to see her urologist next week for consideration for repeat bladder surgery which may be quite.  She has had extensive right hip surgeries and shows me an x-ray picture of her right hip which looks obliterated.  She is still walking with a cane.  She is tolerating aspirin  and Plavix  without any recurrent bleeding.  She is here to discuss removal of her IVC filter.     Past Medical History:  Diagnosis Date   Arthritis    CAD (coronary artery disease)    a. 08/2024 Inferior STEMI/PCI:  LOM nl, LAD 70p (4.0x15 Onyx Frontier DES), 30d, 100d (embolized thrombus), LCX nl, RCA nl, EF 55-65%   Chronic heart failure with preserved ejection fraction (HFpEF) (HCC)    a. 01/2024 Echo: EF 60-65%, no rwma, GrI DD, nl RV, RVSP 32.4 mmHg. Mild MR. Mild to mod TR.   Chronic pain    CSF abnormal 07/15/2014   Fibromyalgia 07/15/2014   Hyperlipidemia LDL goal <70    Hypertension    Memory loss    Myocardial infarction [facility])    Palpitations    Pulmonary embolism (HCC)    a.  08/2023 CTA Chest: Right upper lobe segmental and subsegmental PE-->Eliquis  followed by IVC filter in [place] when Eliquis  d/c in 08/2023 due to SDH.   Scoliosis    Subdural hematoma (HCC)    a. 08/2023  Fall-->SDH-->Eliquis  D/c'd.  Asa/Plavix  held x 2 wks.    Past Surgical History:  Procedure Laterality Date   ABDOMINAL HYSTERECTOMY  2000   CORONARY IMAGING/OCT N/A 08/17/2023   Procedure: CORONARY IMAGING/OCT;  Surgeon: [name], MD;  Location: [facility] INVASIVE CV LAB;  Service: Cardiovascular;  Laterality: N/A;   CORONARY/GRAFT ACUTE MI REVASCULARIZATION N/A 08/17/2023   Procedure: Coronary/Graft Acute MI Revascularization;  Surgeon: [name], MD;  Location: [facility] INVASIVE CV LAB;  Service: Cardiovascular;  Laterality: N/A;   DILATION AND CURETTAGE OF UTERUS     Hip replacement right     x6   LEFT HEART CATH AND CORONARY ANGIOGRAPHY N/A 08/17/2023   Procedure: LEFT HEART CATH AND CORONARY ANGIOGRAPHY;  Surgeon: [name], MD;  Location: [facility] INVASIVE CV LAB;  Service: Cardiovascular;  Laterality: N/A;     Family History  Problem Relation Age of Onset   Cancer Father    Aneurysm Other         grandmother  No bleeding or clotting disorders   Social History   Tobacco Use   Smoking status: Never  Vaping Use   Vaping status: Never Used  Substance Use Topics   Alcohol use: Never   Drug use: Never     Allergies  Allergen Reactions   Clindamycin Anaphylaxis and Itching  Other Reaction(s): Not available  Rash,swelling of face and neck   Clindamycin/Lincomycin Anaphylaxis    Rash,swelling of face and neck   Zolpidem Other (See Comments)    Current Outpatient Medications  Medication Sig Dispense Refill   aspirin  EC 81 MG tablet Take 81 mg by mouth daily. Swallow whole.     atorvastatin  (LIPITOR) 40 MG tablet Take 1 tablet (40 mg total) by mouth daily. 90 tablet 3   baclofen (LIORESAL) 10 MG tablet Take 10 mg by mouth 3 (three) times daily.     clopidogrel  (PLAVIX ) 75 MG tablet Take 1 tablet (75 mg total) by mouth daily. 90 tablet 2   furosemide  (LASIX ) 20 MG tablet Take 1 tablet (20 mg total) by mouth daily as needed for edema (swelling in lower  legs). 90 tablet 3   gabapentin  (NEURONTIN ) 600 MG tablet Take 1 tablet (600 mg total) by mouth 2 (two) times daily.     isosorbide  mononitrate (IMDUR ) 30 MG 24 hr tablet Take 0.5 tablets (15 mg total) by mouth daily. 45 tablet 3   ondansetron  (ZOFRAN -ODT) 4 MG disintegrating tablet Take by mouth.     oxyCODONE  ER (XTAMPZA  ER) 27 MG C12A Take by mouth.     Oxycodone  HCl 10 MG TABS Take 10 mg by mouth in the morning, at noon, in the evening, and at bedtime.     pantoprazole  (PROTONIX ) 20 MG tablet Take 1 tablet (20 mg total) by mouth daily. 30 tablet 0   carvedilol  (COREG ) 3.125 MG tablet Take 1 tablet (3.125 mg total) by mouth 2 (two) times daily. 180 tablet 3   nitroGLYCERIN  (NITROSTAT ) 0.4 MG SL tablet Place 1 tablet (0.4 mg total) under the tongue every 5 (five) minutes as needed for chest pain. 25 tablet 3   Oxycodone  HCl 10 MG TABS Take 1 tablet by mouth 4 (four) times daily as needed (pain). (Patient not taking: Reported on 05/03/2024)     traMADol  (ULTRAM -ER) 200 MG 24 hr tablet Take 200 mg by mouth daily. (Patient not taking: Reported on 05/03/2024)     No current facility-administered medications for this visit.      REVIEW OF SYSTEMS (Negative unless checked)  Constitutional: [] Weight loss  [] Fever  [] Chills Cardiac: [] Chest pain   [] Chest pressure   [] Palpitations   [] Shortness of breath when laying flat   [] Shortness of breath at rest   [] Shortness of breath with exertion. Vascular:  [] Pain in legs with walking   [] Pain in legs at rest   [] Pain in legs when laying flat   [] Claudication   [] Pain in feet when walking  [] Pain in feet at rest  [] Pain in feet when laying flat   [x] History of DVT   [] Phlebitis   [x] Swelling in legs   [] Varicose veins   [] Non-healing ulcers Pulmonary:   [] Uses home oxygen   [] Productive cough   [] Hemoptysis   [] Wheeze  [] COPD   [] Asthma Neurologic:  [] Dizziness  [] Blackouts   [] Seizures   [x] History of stroke   [] History of TIA  [] Aphasia   [] Temporary  blindness   [] Dysphagia   [] Weakness or numbness in arms   [] Weakness or numbness in legs Musculoskeletal:  [x] Arthritis   [] Joint swelling   [x] Joint pain   [] Low back pain Hematologic:  [] Easy bruising  [] Easy bleeding   [] Hypercoagulable state   [] Anemic  [] Hepatitis Gastrointestinal:  [] Blood in stool   [] Vomiting blood  [] Gastroesophageal reflux/heartburn   [] Abdominal pain Genitourinary:  [] Chronic kidney disease   [] Difficult  urination  [x] Frequent urination  [] Burning with urination   [] Hematuria Skin:  [] Rashes   [] Ulcers   [] Wounds Psychological:  [] History of anxiety   []  History of major depression.    Physical Exam BP 121/87   Pulse (!) 50   Ht 5' 4 (1.626 m)   Wt 157 lb 2 oz (71.3 kg)   BMI 26.97 kg/m  Gen:  WD/WN, NAD Head: [place]/AT, No temporalis wasting.  Ear/Nose/Throat: Hearing grossly intact, nares w/o erythema or drainage, oropharynx w/o Erythema/Exudate Eyes: Conjunctiva clear, sclera non-icteric  Neck: trachea midline.  No JVD.  Pulmonary:  Good air movement, respirations not labored, no use of accessory muscles  Cardiac: Bradycardic Vascular:  Vessel Right Left  Radial Palpable Palpable                                   Gastrointestinal:. No masses, surgical incisions, or scars. Musculoskeletal: M/S 5/5 throughout.  Extremities without ischemic changes.  Walks with a cane.  Mild bilateral lower extremity edema. Neurologic: Sensation grossly intact in extremities.  Symmetrical.  Speech is fluent. Motor exam as listed above. Psychiatric: Judgment intact, Mood & affect appropriate for pt's clinical situation. Dermatologic: No rashes or ulcers noted.  No cellulitis or open wounds.    Radiology No results found.  Labs Recent Results (from the past 2160 hours)  Basic Metabolic Panel (BMET)     Status: Abnormal   Collection Time: 02/19/24 10:48 AM  Result Value Ref Range   Glucose 124 (H) 70 - 99 mg/dL   BUN 16 8 - 27 mg/dL   Creatinine, Ser 8.99  0.57 - 1.00 mg/dL   eGFR 64 >40 fO/fpw/8.26   BUN/Creatinine Ratio 16 12 - 28   Sodium 141 134 - 144 mmol/L   Potassium 4.7 3.5 - 5.2 mmol/L   Chloride 104 96 - 106 mmol/L   CO2 22 20 - 29 mmol/L   Calcium  9.7 8.7 - 10.3 mg/dL  Magnesium     Status: None   Collection Time: 02/19/24 10:48 AM  Result Value Ref Range   Magnesium 2.1 1.6 - 2.3 mg/dL  TSH     Status: None   Collection Time: 02/19/24 10:48 AM  Result Value Ref Range   TSH 4.010 0.450 - 4.500 uIU/mL    Assessment/Plan:  Presence of IVC filter The patient has had an IVC filter in place for about 8 months.  This was placed when she had a pulmonary embolus but had to stop anticoagulation due to subdural hematoma.  She is on aspirin  and Plavix  now.  She had a negative DVT study relatively recently.  I do think removal of her IVC filter is reasonable given the small lifetime risk of recurrent thrombosis particularly off of anticoagulation.  The risks of fracture migration are very small but catastrophic when they occur.  I would however wait until she is evaluated by urology and if she needs another urologic surgery, I would wait to remove the filter until after she sees them.  I will plan on just doing a venous DVT study a month or so after her bladder surgery if that occurs.  If she ends up not having bladder surgery, she can call to schedule her filter removal.  Hyperlipidemia LDL goal <70 lipid control important in reducing the progression of atherosclerotic disease. Continue statin therapy   Subdural hematoma (HCC) Not on anticoagulation for this reason  Essential hypertension blood  pressure control important in reducing the progression of atherosclerotic disease. On appropriate oral medications.      Selinda Gu 05/03/2024, 11:45 AM   This note was created with Dragon medical transcription system.  Any errors from dictation are unintentional.

## 2024-05-03 NOTE — Patient Instructions (Signed)
 Inferior Vena Cava Filter Removal  Inferior vena cava filter removal is a procedure to take out a metal filter that was placed into a large vein in the abdomen (inferior vena cava, or IVC). An IVC filter prevents blood clots in the legs or pelvis from traveling to the lungs. Some IVC filters are designed to be removed (retrievable filters). You may have your filter removed when the danger of forming blood clots has passed or when you can take blood-thinning medicine to prevent blood clots. In some cases, the filter may need to be removed because it becomes damaged, is not working, or is causing problems. Most filters can be removed through the vein (percutaneously). In the rare cases when the health care provider is unable to remove the filter percutaneously, one of these steps may be taken: A more invasive, open surgery. The filter may be left in place. Tell a health care provider about: Any allergies you have, including iodine. All medicines you are taking, including vitamins, herbs, eye drops, creams, and over-the-counter medicines. Any problems you or family members have had with anesthetic medicines or with contrast dyes that are used during imaging tests. Any bleeding problems you have. Any surgeries you have had. Any medical conditions you have. Whether you are pregnant or may be pregnant. What are the risks? Generally, this is a safe procedure. However, serious problems may occur, especially if the filter has been in for more than a few months. Possible problems include: Infection. Bleeding. Allergic reactions to medicines or dyes. Damage to the IVC, other blood vessels, or nearby structures. A blood clot or a piece of the filter breaking loose and traveling to the lungs. What happens before the procedure? When to stop eating and drinking Follow instructions from your health care provider about what you may eat and drink before your procedure. These may include: 8 hours before your  procedure Stop eating most foods. Do not eat meat, fried foods, or fatty foods. Eat only light foods, such as toast or crackers. All liquids are okay except energy drinks and alcohol. 6 hours before your procedure Stop eating. Drink only clear liquids, such as water, clear fruit juice, black coffee, plain tea, and sports drinks. Do not drink energy drinks or alcohol. 2 hours before your procedure Stop drinking all liquids. You may be allowed to take medicines with small sips of water. If you do not follow your health care provider's instructions, your procedure may be delayed or canceled. Medicines Ask your health care provider about: Changing or stopping your regular medicines. This is especially important if you are taking diabetes medicines or blood thinners. Taking medicines such as aspirin and ibuprofen. These medicines can thin your blood. Do not take these medicines unless your health care provider tells you to take them. Taking over-the-counter medicines, vitamins, herbs, and supplements. General instructions Do not use any products that contain nicotine or tobacco for at least 4 weeks before the procedure. These products include cigarettes, chewing tobacco, and vaping devices, such as e-cigarettes. If you need help quitting, ask your health care provider. Ask your health care provider: How your surgery site will be marked. What steps will be taken to help prevent infection. These steps may include: Removing hair at the surgery site. Washing skin with a germ-killing soap. Receiving antibiotic medicine. If you will be going home right after the procedure, plan to have a responsible adult: Take you home from the hospital or clinic. You will not be allowed to drive. Care for  you for the time you are told. What happens during the procedure? An IV will be inserted into one of your veins. You will be given one or more of the following: A medicine to help you relax (sedative). A  medicine to numb the area (local anesthetic). A medicine to make you fall asleep (general anesthetic). The procedure will be done through a vein in your groin or neck that leads to the IVC. A small incision will be made over the vein. A long, thin tube (catheter) will be inserted into the vein. The catheter will be moved through your vein and into your IVC. X-rays may be done to help guide the catheter into place. Dye may be injected through the catheter before the X-rays to make the catheter and filter easier to see. When the catheter reaches the filter, a hook (snare) on the end of the catheter may be used to latch on to the filter. In some cases, a grasping instrument (forceps) may be threaded through the catheter to gently grab and remove the filter instead. After the filter has been hooked or grasped, the filter and instruments will be pulled out through the catheter. The catheter will be removed through the insertion site in your skin. Pressure will be placed over your insertion site until bleeding stops. A bandage (dressing) will be placed over the catheter insertion site. The procedure may vary among health care providers and hospitals. What happens after the procedure? Your blood pressure, heart rate, breathing rate, and blood oxygen level will be monitored until you leave the hospital or clinic. You may need to stay in bed (be on bed rest) for a period of time. Your insertion site will be monitored for the first few hours for any signs of bleeding. If you were given a sedative during the procedure, it can affect you for several hours. Do not drive or operate machinery until your health care provider says that it is safe. Summary Inferior vena cava (IVC) filter removal is a procedure to take out a filter that was placed to prevent blood clots from traveling to your lungs. You may have your filter removed when the danger of forming blood clots has passed or when you can take  blood-thinning medicines to prevent blood clots. In some cases, a filter is removed because there is a problem with it. A long, thin tube (catheter) will be inserted through a vein in your neck or groin. Then, the filter will be gently grasped and pulled out through the catheter. Plan to have a responsible adult care for you for the time you are told. This information is not intended to replace advice given to you by your health care provider. Make sure you discuss any questions you have with your health care provider. Document Revised: 10/08/2021 Document Reviewed: 10/08/2021 Elsevier Patient Education  2024 [employer].

## 2024-05-03 NOTE — Assessment & Plan Note (Signed)
 blood pressure control important in reducing the progression of atherosclerotic disease. On appropriate oral medications.

## 2024-05-03 NOTE — Assessment & Plan Note (Signed)
 Not on anticoagulation for this reason

## 2024-05-03 NOTE — Assessment & Plan Note (Signed)
 lipid control important in reducing the progression of atherosclerotic disease. Continue statin therapy

## 2024-05-03 NOTE — Assessment & Plan Note (Signed)
 The patient has had an IVC filter in place for about 8 months.  This was placed when she had a pulmonary embolus but had to stop anticoagulation due to subdural hematoma.  She is on aspirin  and Plavix  now.  She had a negative DVT study relatively recently.  I do think removal of her IVC filter is reasonable given the small lifetime risk of recurrent thrombosis particularly off of anticoagulation.  The risks of fracture migration are very small but catastrophic when they occur.  I would however wait until she is evaluated by urology and if she needs another urologic surgery, I would wait to remove the filter until after she sees them.  I will plan on just doing a venous DVT study a month or so after her bladder surgery if that occurs.  If she ends up not having bladder surgery, she can call to schedule her filter removal.

## 2024-05-09 ENCOUNTER — Encounter (HOSPITAL_COMMUNITY): Payer: Self-pay

## 2024-05-10 ENCOUNTER — Ambulatory Visit: Admitting: Urology

## 2024-05-12 ENCOUNTER — Ambulatory Visit
Admission: RE | Admit: 2024-05-12 | Discharge: 2024-05-12 | Disposition: A | Discharge status: Home / Self Care | Source: Ambulatory Visit | Attending: Cardiology | Admitting: Cardiology

## 2024-05-12 DIAGNOSIS — R079 Chest pain, unspecified: Secondary | ICD-10-CM | POA: Insufficient documentation

## 2024-05-12 DIAGNOSIS — I2089 Other forms of angina pectoris: Secondary | ICD-10-CM | POA: Diagnosis not present

## 2024-05-12 LAB — NM PET CT CARDIAC PERFUSION MULTI W/ABSOLUTE BLOODFLOW
LV dias vol: 888 mL (ref 46–106)
LV sys vol: 30 mL (ref 3.8–5.2)
MBFR: 2.33
Nuc Rest EF: 66 %
Nuc Stress EF: 67 %
Peak HR: 78 {beats}/min
Rest HR: 53 {beats}/min
Rest MBF: 0.88 ml/g/min
Rest Nuclear Isotope Dose: 18.5 mCi
SRS: 0
SSS: 2
ST Depression (mm): 0 mm
Stress MBF: 2.05 ml/g/min
Stress Nuclear Isotope Dose: 18.5 mCi
TID: 1.02

## 2024-05-12 MED ORDER — RUBIDIUM RB82 GENERATOR (RUBYFILL)
25.0000 | PACK | Freq: Once | INTRAVENOUS | Status: AC
  Administered 2024-05-12: 18.47 via INTRAVENOUS

## 2024-05-12 MED ORDER — REGADENOSON 0.4 MG/5ML IV SOLN
INTRAVENOUS | Status: AC
Start: 2024-05-12 — End: 2024-05-12
  Filled 2024-05-12: qty 5

## 2024-05-12 MED ORDER — RUBIDIUM RB82 GENERATOR (RUBYFILL)
25.0000 | PACK | Freq: Once | INTRAVENOUS | Status: AC
  Administered 2024-05-12: 18.46 via INTRAVENOUS

## 2024-05-12 MED ORDER — REGADENOSON 0.4 MG/5ML IV SOLN
0.4000 mg | Freq: Once | INTRAVENOUS | Status: AC
  Administered 2024-05-12: 0.4 mg via INTRAVENOUS
  Filled 2024-05-12: qty 5

## 2024-05-12 NOTE — Progress Notes (Signed)
 Patient presents for a cardiac PET stress test and tolerated procedure but did report some brief shortness of breath.  At the conclusion of the test patient had some chest tightness. Patient maintained acceptable vital signs throughout the test and was offered caffeine  after test.  After a few minutes, the patient reports her chest tightness had improved and she was ready to go. Patient ambulated out of department with a steady gait.

## 2024-05-16 ENCOUNTER — Ambulatory Visit: Admitting: Urology

## 2024-05-16 ENCOUNTER — Ambulatory Visit: Payer: Self-pay | Admitting: Cardiology

## 2024-05-18 ENCOUNTER — Encounter: Payer: Self-pay | Admitting: Cardiology

## 2024-05-18 ENCOUNTER — Ambulatory Visit: Attending: Cardiology | Admitting: Cardiology

## 2024-05-18 VITALS — BP 147/87 | HR 48 | Ht 64.0 in | Wt 159.8 lb

## 2024-05-18 DIAGNOSIS — I2510 Atherosclerotic heart disease of native coronary artery without angina pectoris: Secondary | ICD-10-CM

## 2024-05-18 DIAGNOSIS — I10 Essential (primary) hypertension: Secondary | ICD-10-CM | POA: Diagnosis not present

## 2024-05-18 MED ORDER — RANOLAZINE ER 500 MG PO TB12: 500.0000 mg | ORAL_TABLET | Freq: Two times a day (BID) | ORAL | 3 refills | Status: DC

## 2024-05-18 NOTE — Patient Instructions (Signed)
 Medication Instructions:  - STOP imdur  - START renexa 500 mg twice a day  *If you need a refill on your cardiac medications before your next appointment, please call your pharmacy*  Lab Work: No labs ordered today  If you have labs (blood work) drawn today and your tests are completely normal, you will receive your results only by: MyChart Message (if you have MyChart) OR A paper copy in the mail If you have any lab test that is abnormal or we need to change your treatment, we will call you to review the results.  Testing/Procedures: No test ordered today   Follow-Up: At [facility], you and your health needs are our priority.  As part of our continuing mission to provide you with exceptional heart care, our providers are all part of one team.  This team includes your primary Cardiologist (physician) and Advanced Practice Providers or APPs (Physician Assistants and Nurse Practitioners) who all work together to provide you with the care you need, when you need it.  Your next appointment:   6 month(s)  Provider:   You may see Redell Cave, MD or one of the following Advanced Practice Providers on your designated Care Team:   Lonni Meager, NP Lesley Maffucci, PA-C Bernardino Bring, PA-C Cadence Clermont, PA-C Tylene Lunch, NP Barnie Hila, NP    We recommend signing up for the patient portal called MyChart.  Sign up information is provided on this After Visit Summary.  MyChart is used to connect with patients for Virtual Visits (Telemedicine).  Patients are able to view lab/test results, encounter notes, upcoming appointments, etc.  Non-urgent messages can be sent to your provider as well.   To learn more about what you can do with MyChart, go to [email or web address].

## 2024-05-18 NOTE — Progress Notes (Signed)
 Cardiology Office Note:    Date:  05/18/2024   ID:  Alicia Moyer, DOB 09/10/1961, MRN [id number]  PCP:  [name], MD   [facility] Providers Cardiologist:  Redell Cave, MD     Referring MD: [name], MD   Chief Complaint  Patient presents with   Follow-up    Following up post CT. Patient states that she has experienced some chest pains under her arms. Meds reviewed.     History of Present Illness:    Alicia Moyer is a 63 y.o. female with a hx of CAD (s/p PCI proximal LAD 08/2023, distal LAD 100%), HFpEF, hypertension, presenting for follow-up.  Patient with symptoms of discomfort, started on Imdur  but stopped taking due to headaches.  Still has occasional nonexertional chest pain typically around armpits.  Also has episodic epigastric pain.  Takes Protonix  sparingly.  Patient is considering bladder procedure and possible for procedure in the near future.  Her antiplatelet may need to be held.    Prior notes/testing Echo 01/2024 EF 60 to 65% Echo 01/2022 EF 60 to 65%, diastolic function normal. 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  Subsequently underwent right hip replacement in adulthood.  Her right hip unfortunately fractured leading to chronic pain.  Has seen multiple orthopedic specialist and was told there is nothing to do.  Takes Norco chronically for pain.    Past Medical History:  Diagnosis Date   Arthritis    CAD (coronary artery disease)    a. 08/2024 Inferior STEMI/PCI:  LOM nl, LAD 70p (4.0x15 Onyx Frontier DES), 30d, 100d (embolized thrombus), LCX nl, RCA nl, EF 55-65%   Chronic heart failure with preserved ejection fraction (HFpEF) (HCC)    a. 01/2024 Echo: EF 60-65%, no rwma, GrI DD, nl RV, RVSP 32.4 mmHg. Mild MR. Mild to mod TR.   Chronic pain    CSF abnormal 07/15/2014   Fibromyalgia 07/15/2014   Hyperlipidemia LDL goal <70    Hypertension    Memory loss    Myocardial infarction [facility])    Palpitations    Pulmonary embolism (HCC)    a.   08/2023 CTA Chest: Right upper lobe segmental and subsegmental PE-->Eliquis  followed by IVC filter in  when Eliquis  d/c in 08/2023 due to SDH.   Scoliosis    Subdural hematoma (HCC)    a. 08/2023 Fall-->SDH-->Eliquis  D/c'd.  Asa/Plavix  held x 2 wks.    Past Surgical History:  Procedure Laterality Date   ABDOMINAL HYSTERECTOMY  2000   CORONARY IMAGING/OCT N/A 08/17/2023   Procedure: CORONARY IMAGING/OCT;  Surgeon: [name], MD;  Location: [facility] INVASIVE CV LAB;  Service: Cardiovascular;  Laterality: N/A;   CORONARY/GRAFT ACUTE MI REVASCULARIZATION N/A 08/17/2023   Procedure: Coronary/Graft Acute MI Revascularization;  Surgeon: [name], MD;  Location: [facility] INVASIVE CV LAB;  Service: Cardiovascular;  Laterality: N/A;   DILATION AND CURETTAGE OF UTERUS     Hip replacement right     x6   LEFT HEART CATH AND CORONARY ANGIOGRAPHY N/A 08/17/2023   Procedure: LEFT HEART CATH AND CORONARY ANGIOGRAPHY;  Surgeon: [name], MD;  Location: [facility] INVASIVE CV LAB;  Service: Cardiovascular;  Laterality: N/A;    Current Medications: Current Meds  Medication Sig   aspirin  EC 81 MG tablet Take 81 mg by mouth daily. Swallow whole.   atorvastatin  (LIPITOR) 40 MG tablet Take 1 tablet (40 mg total) by mouth daily.   baclofen (LIORESAL) 10 MG tablet Take 10 mg by mouth 3 (  three) times daily.   carvedilol  (COREG ) 3.125 MG tablet Take 1 tablet (3.125 mg total) by mouth 2 (two) times daily.   clopidogrel  (PLAVIX ) 75 MG tablet Take 1 tablet (75 mg total) by mouth daily.   furosemide  (LASIX ) 20 MG tablet Take 1 tablet (20 mg total) by mouth daily as needed for edema (swelling in lower legs).   gabapentin  (NEURONTIN ) 600 MG tablet Take 1 tablet (600 mg total) by mouth 2 (two) times daily.   nitroGLYCERIN  (NITROSTAT ) 0.4 MG SL tablet Place 1 tablet (0.4 mg total) under the tongue every 5 (five) minutes as needed for chest pain.   ondansetron  (ZOFRAN -ODT) 4 MG disintegrating tablet Take by  mouth.   oxyCODONE  ER (XTAMPZA  ER) 27 MG C12A Take by mouth.   Oxycodone  HCl 10 MG TABS Take 10 mg by mouth in the morning, at noon, in the evening, and at bedtime.   pantoprazole  (PROTONIX ) 20 MG tablet Take 1 tablet (20 mg total) by mouth daily.   ranolazine  (RANEXA ) 500 MG 12 hr tablet Take 1 tablet (500 mg total) by mouth 2 (two) times daily.   [DISCONTINUED] isosorbide  mononitrate (IMDUR ) 30 MG 24 hr tablet Take 0.5 tablets (15 mg total) by mouth daily.     Allergies:   Clindamycin, Clindamycin/lincomycin, and Zolpidem   Social History   Socioeconomic History   Marital status: Married    Spouse name: Not on file   Number of children: Not on file   Years of education: Not on file   Highest education level: Not on file  Occupational History   Not on file  Tobacco Use   Smoking status: Never   Smokeless tobacco: Not on file  Vaping Use   Vaping status: Never Used  Substance and Sexual Activity   Alcohol use: Never   Drug use: Never   Sexual activity: Not on file  Other Topics Concern   Not on file  Social History Narrative   Not on file   Social Drivers of Health   Financial Resource Strain: Low Risk  (06/03/2023)   Received from [facility]   Overall Financial Resource Strain (CARDIA)    Difficulty of Paying Living Expenses: Not hard at all  Food Insecurity: Food Insecurity Present (12/09/2023)   Hunger Vital Sign    Worried About Running Out of Food in the Last Year: Sometimes true    Ran Out of Food in the Last Year: Sometimes true  Transportation Needs: Unmet Transportation Needs (12/09/2023)   PRAPARE - Administrator, Civil Service (Medical): Yes    Lack of Transportation (Non-Medical): Yes  Physical Activity: Not on file  Stress: Not on file  Social Connections: Not on file     Family History: The patient's family history includes Aneurysm in an other family member; Cancer in her father.  ROS:   Please see the history of  present illness.     All other systems reviewed and are negative.  EKGs/Labs/Other Studies Reviewed:    The following studies were reviewed today:   Recent Labs: 08/17/2023: ALT 25 08/28/2023: Hemoglobin 11.8; Platelets 384 02/19/2024: BUN 16; Creatinine, Ser 1.00; Magnesium 2.1; Potassium 4.7; Sodium 141; TSH 4.010  Recent Lipid Panel    Component Value Date/Time   CHOL 116 08/17/2023 2138   TRIG 54 08/17/2023 2138   HDL 52 08/17/2023 2138   CHOLHDL 2.2 08/17/2023 2138   VLDL 11 08/17/2023 2138   LDLCALC 53 08/17/2023 2138     Risk  Assessment/Calculations:          Physical Exam:    VS:  BP (!) 147/87   Pulse (!) 48   Ht 5' 4 (1.626 m)   Wt 159 lb 12.8 oz (72.5 kg)   SpO2 96%   BMI 27.43 kg/m     Wt Readings from Last 3 Encounters:  05/18/24 159 lb 12.8 oz (72.5 kg)  05/03/24 157 lb 2 oz (71.3 kg)  04/15/24 154 lb 12.8 oz (70.2 kg)     GEN:  Well nourished, well developed in no acute distress HEENT: Normal NECK: No JVD; No carotid bruits CARDIAC: RRR, no murmurs, rubs, gallops RESPIRATORY:  Clear to auscultation without rales, wheezing or rhonchi  ABDOMEN: Soft, non-tender, non-distended MUSCULOSKELETAL:  No edema; No deformity  SKIN: Warm and dry NEUROLOGIC:  Alert and oriented x 3 PSYCHIATRIC:  Normal affect   ASSESSMENT:    1. Coronary artery disease involving native coronary artery of native heart, unspecified whether angina present   2. Primary hypertension     PLAN:    In order of problems listed above:  CAD s/p PCI to proximal LAD 08/2023, distal LAD 100% occluded.  Occasional chest pains.  Cardiac PET 8/25, small defect/ischemia in LAD area, likely due to previous stent/LAD blockage.  Start Ranexa  500 mg twice daily, continue Coreg , continue aspirin , Plavix , Lipitor 40 mg daily.  LDL at goal.   EF 60 to 65%.  Okay to hold DAPT if procedure is being performed.  Restart as soon as safely possible after. Hypertension, BP elevated today, usually  controlled.  Continue Coreg  3.125 mg twice daily, Imdur  as above.  Follow-up in 3 months         Medication Adjustments/Labs and Tests Ordered: Current medicines are reviewed at length with the patient today.  Concerns regarding medicines are outlined above.  No orders of the defined types were placed in this encounter.  Meds ordered this encounter  Medications   ranolazine  (RANEXA ) 500 MG 12 hr tablet    Sig: Take 1 tablet (500 mg total) by mouth 2 (two) times daily.    Dispense:  90 tablet    Refill:  3    Patient Instructions  Medication Instructions:  - STOP imdur  - START renexa 500 mg twice a day  *If you need a refill on your cardiac medications before your next appointment, please call your pharmacy*  Lab Work: No labs ordered today  If you have labs (blood work) drawn today and your tests are completely normal, you will receive your results only by: MyChart Message (if you have MyChart) OR A paper copy in the mail If you have any lab test that is abnormal or we need to change your treatment, we will call you to review the results.  Testing/Procedures: No test ordered today   Follow-Up: At [facility], you and your health needs are our priority.  As part of our continuing mission to provide you with exceptional heart care, our providers are all part of one team.  This team includes your primary Cardiologist (physician) and Advanced Practice Providers or APPs (Physician Assistants and Nurse Practitioners) who all work together to provide you with the care you need, when you need it.  Your next appointment:   6 month(s)  Provider:   You may see Redell Cave, MD or one of the following Advanced Practice Providers on your designated Care Team:   Lonni Meager, NP Lesley Maffucci, PA-C Bernardino Bring, PA-C Cadence Franchester, PA-C  Tylene Lunch, NP Barnie Hila, NP    We recommend signing up for the patient portal called MyChart.  Sign up information  is provided on this After Visit Summary.  MyChart is used to connect with patients for Virtual Visits (Telemedicine).  Patients are able to view lab/test results, encounter notes, upcoming appointments, etc.  Non-urgent messages can be sent to your provider as well.   To learn more about what you can do with MyChart, go to [email or web address].         Signed, Redell Cave, MD  05/18/2024 1:08 PM    [facility]

## 2024-05-24 ENCOUNTER — Encounter: Payer: Self-pay | Admitting: Cardiology

## 2024-05-26 NOTE — Addendum Note (Signed)
 Addended by: BRIEN SALM on: 05/26/2024 08:00 AM   Modules accepted: Orders

## 2024-07-08 ENCOUNTER — Encounter (INDEPENDENT_AMBULATORY_CARE_PROVIDER_SITE_OTHER)

## 2024-07-08 ENCOUNTER — Ambulatory Visit (INDEPENDENT_AMBULATORY_CARE_PROVIDER_SITE_OTHER): Admitting: Vascular Surgery

## 2024-07-18 ENCOUNTER — Inpatient Hospital Stay: Admitting: Oncology

## 2024-07-18 ENCOUNTER — Telehealth: Payer: Self-pay | Admitting: Oncology

## 2024-07-18 NOTE — Telephone Encounter (Signed)
 Pt called to cancel appt today (MD only) and did not want to r/s. Appt is canceled and noted

## 2024-07-19 ENCOUNTER — Ambulatory Visit

## 2024-07-19 ENCOUNTER — Ambulatory Visit: Attending: Nurse Practitioner | Admitting: Nurse Practitioner

## 2024-07-19 ENCOUNTER — Encounter: Payer: Self-pay | Admitting: Nurse Practitioner

## 2024-07-19 VITALS — BP 130/72 | HR 50 | Ht 64.0 in | Wt 155.8 lb

## 2024-07-19 DIAGNOSIS — I5032 Chronic diastolic (congestive) heart failure: Secondary | ICD-10-CM | POA: Diagnosis not present

## 2024-07-19 DIAGNOSIS — I10 Essential (primary) hypertension: Secondary | ICD-10-CM

## 2024-07-19 DIAGNOSIS — R001 Bradycardia, unspecified: Secondary | ICD-10-CM

## 2024-07-19 DIAGNOSIS — G4730 Sleep apnea, unspecified: Secondary | ICD-10-CM

## 2024-07-19 DIAGNOSIS — E785 Hyperlipidemia, unspecified: Secondary | ICD-10-CM

## 2024-07-19 DIAGNOSIS — I25119 Atherosclerotic heart disease of native coronary artery with unspecified angina pectoris: Secondary | ICD-10-CM | POA: Diagnosis not present

## 2024-07-19 DIAGNOSIS — S065XAA Traumatic subdural hemorrhage with loss of consciousness status unknown, initial encounter: Secondary | ICD-10-CM

## 2024-07-19 DIAGNOSIS — Z86711 Personal history of pulmonary embolism: Secondary | ICD-10-CM

## 2024-07-19 DIAGNOSIS — S065XAD Traumatic subdural hemorrhage with loss of consciousness status unknown, subsequent encounter: Secondary | ICD-10-CM

## 2024-07-19 NOTE — Progress Notes (Signed)
 Office Visit    Patient Name: Alicia Moyer Date of Encounter: 07/19/2024  Primary Care Provider:  [name], MD Primary Cardiologist:  Alicia Cave, MD    Chief Complaint    64 y.o. female with a history of CAD status post inferior STEMI with LAD occlusion and stenting in November 2024, chronic HFpEF, pulmonary embolism (November 2024), hypertension, hyperlipidemia, nonsustained VT, chronic pain, and fibromyalgia, who presents for follow-up related to bradycardia.  Past Medical History   Subjective   Past Medical History:  Diagnosis Date   Arthritis    CAD (coronary artery disease)    a. 08/2024 Inferior STEMI/PCI:  LM nl, LAD 70p (4.0x15 Onyx Frontier DES), 30d, 100d (embolized thrombus), LCX nl, RCA nl, EF 55-65%; b. 05/2024 PET Stress: small reversible apical inferior defect. EF 66%. No rwma. Low risk.   Chronic heart failure with preserved ejection fraction (HFpEF) (HCC)    a. 01/2024 Echo: EF 60-65%, no rwma, GrI DD, nl RV, RVSP 32.4 mmHg. Mild MR. Mild to mod TR.   Chronic pain    CSF abnormal 07/15/2014   Fibromyalgia 07/15/2014   Hyperlipidemia LDL goal <70    Hypertension    Memory loss    Myocardial infarction [facility]    Palpitations    Pulmonary embolism (HCC)    a.  08/2023 CTA Chest: Right upper lobe segmental and subsegmental PE-->Eliquis  followed by IVC filter in [place] when Eliquis  d/c in 08/2023 due to SDH.   Scoliosis    Subdural hematoma (HCC)    a. 08/2023 Fall-->SDH-->Eliquis  D/c'd.  Asa/Plavix  held x 2 wks.   Past Surgical History:  Procedure Laterality Date   ABDOMINAL HYSTERECTOMY  2000   CORONARY IMAGING/OCT N/A 08/17/2023   Procedure: CORONARY IMAGING/OCT;  Surgeon: Alicia Bruckner, MD;  Location: [facility] INVASIVE CV LAB;  Service: Cardiovascular;  Laterality: N/A;   CORONARY/GRAFT ACUTE MI REVASCULARIZATION N/A 08/17/2023   Procedure: Coronary/Graft Acute MI Revascularization;  Surgeon: Alicia Bruckner, MD;  Location: [facility] INVASIVE CV  LAB;  Service: Cardiovascular;  Laterality: N/A;   DILATION AND CURETTAGE OF UTERUS     Hip replacement right     x6   LEFT HEART CATH AND CORONARY ANGIOGRAPHY N/A 08/17/2023   Procedure: LEFT HEART CATH AND CORONARY ANGIOGRAPHY;  Surgeon: Alicia Bruckner, MD;  Location: [facility] INVASIVE CV LAB;  Service: Cardiovascular;  Laterality: N/A;    Allergies  Allergies  Allergen Reactions   Clindamycin Anaphylaxis and Itching    Other Reaction(s): Not available  Rash,swelling of face and neck   Clindamycin/Lincomycin Anaphylaxis    Rash,swelling of face and neck   Zolpidem Other (See Comments)    Other Reaction(s): Not available       History of Present Illness      63 y.o. y/o female with a history of CAD, chronic HFpEF, PE, hypertension, hyperlipidemia, nonsustained VT, chronic pain, and fibromyalgia.  She was previously followed at [facility] clinic, establishing care in December 2019 due to history of chest pain and a son with bicuspid aortic valve.  Echo at that time showed normal LV function with trivial MR.  Stress testing was low risk.  She was subsequently admitted in March 2023 with HFpEF with echo showing normal LV function and grade 2 diastolic dysfunction, along with mild MR.  She required initiation of diuretic therapy.  She subsequently established care with Dr. Cave in April 2023.  In November 2024, patient presented to [facility] regional with chest pain and subtle inferior ST segment  elevation.  Emergent diagnostic catheterization revealed severe thrombotic proximal LAD disease with distal occlusion of the LAD in the setting of thrombotic emboli.  The proximal LAD was felt to be a culprit vessel and was successfully treated with a drug-eluting stent.  Echo during hospitalization again showed normal LV function.  She was also found to have a RUL PE and was placed on Eliquis .   She then required admission in [place]  after a mechanical fall.  Initial head CT was negative  but she subsequently suffered a seizure with repeat head CT showing subdural hematoma.  Per patient, Eliquis  was discontinued and IVC filter was placed.  She held aspirin  and Plavix  for 2 weeks and subsequently resumed.  She complained of intermittent chest pain and palpitations (PACs on home K[employer]ia mobile monitoring) at May 2025 visit.  A Lexiscan  PET stress test was performed in August 2025 and showed a small reversible defect in the apical inferior location with normal LV function.  This was felt to be 2/2 known a distal embolic disease in the LAD and overall, the study was felt to be low risk.     [name] was last seen in cardiology clinic in August 2025, at which time she noted occasional nonexertional chest and axillary pain.  She has been managed with Ranexa , which she tolerates well.  More or less since her heart attack, she has had constant, mild left axillary pain without associated symptoms.  Sometimes this worsened on at least 1 occasion in the past 6 months, she has taken nitroglycerin .  Since her PET stress test, she has had overall stable left axillary pain.  Activity is very limited by history of chronic right hip fracture.  She has some degree of chronic dyspnea on exertion.  She is more concerned today about low heart rates documented on her Apple Watch, typically occurring during periods of sleep either in the middle of the night or while napping during the day.  Her watch is set to alert her when heart rates drop below 50.  She has not received alerts while awake.  In the setting of low heart rates on home monitoring, she has stopped her carvedilol .  She recently asked to become off of her Plavix  and was told that she could but she has continued and plans to finish the current bottle that she has.  She denies PND, orthopnea, dizziness, syncope, edema, or early satiety. Objective   Home Medications    Current Outpatient Medications  Medication Sig Dispense Refill   aspirin  EC 81 MG  tablet Take 81 mg by mouth daily. Swallow whole.     atorvastatin  (LIPITOR) 40 MG tablet Take 1 tablet (40 mg total) by mouth daily. 90 tablet 3   baclofen (LIORESAL) 10 MG tablet Take 10 mg by mouth 3 (three) times daily.     clopidogrel  (PLAVIX ) 75 MG tablet Take 75 mg by mouth daily.     furosemide  (LASIX ) 20 MG tablet Take 1 tablet (20 mg total) by mouth daily as needed for edema (swelling in lower legs). 90 tablet 3   gabapentin  (NEURONTIN ) 600 MG tablet Take 1 tablet (600 mg total) by mouth 2 (two) times daily.     nitroGLYCERIN  (NITROSTAT ) 0.4 MG SL tablet Place 1 tablet (0.4 mg total) under the tongue every 5 (five) minutes as needed for chest pain. 25 tablet 3   ondansetron  (ZOFRAN -ODT) 4 MG disintegrating tablet Take by mouth.     oxyCODONE  ER (XTAMPZA  ER) 27 MG C12A  Take by mouth.     Oxycodone  HCl 10 MG TABS Take 10 mg by mouth in the morning, at noon, in the evening, and at bedtime.     ranolazine  (RANEXA ) 500 MG 12 hr tablet Take 1 tablet (500 mg total) by mouth 2 (two) times daily. 90 tablet 3   carvedilol  (COREG ) 3.125 MG tablet Take 1 tablet (3.125 mg total) by mouth 2 (two) times daily. (Patient not taking: Reported on 07/19/2024) 180 tablet 3   Oxycodone  HCl 10 MG TABS Take 1 tablet by mouth 4 (four) times daily as needed (pain). (Patient not taking: Reported on 07/19/2024)     pantoprazole  (PROTONIX ) 20 MG tablet Take 1 tablet (20 mg total) by mouth daily. (Patient not taking: Reported on 07/19/2024) 30 tablet 0   traMADol  (ULTRAM -ER) 200 MG 24 hr tablet Take 200 mg by mouth daily. (Patient not taking: Reported on 07/19/2024)     No current facility-administered medications for this visit.     Physical Exam    VS:  BP 130/72 (BP Location: Left Arm, Patient Position: Sitting)   Pulse (!) 50   Ht 5' 4 (1.626 m)   Wt 155 lb 12.8 oz (70.7 kg)   SpO2 95%   BMI 26.74 kg/m  , BMI Body mass index is 26.74 kg/m.    STOP-Bang Score:  3        GEN: Well nourished, well  developed, in no acute distress. HEENT: normal. Neck: Supple, no JVD, carotid bruits, or masses. Cardiac: RRR, no murmurs, rubs, or gallops. No clubbing, cyanosis, edema.  Radials 2+/PT 2+ and equal bilaterally.  Respiratory:  Respirations regular and unlabored, clear to auscultation bilaterally. GI: Soft, nontender, nondistended, BS + x 4. MS: no deformity or atrophy. Skin: warm and dry, no rash. Neuro:  Strength and sensation are intact. Psych: Normal affect.  Accessory Clinical Findings    ECG personally reviewed by me today - EKG Interpretation Date/Time:  Tuesday July 19 2024 11:21:58 EDT Ventricular Rate:  50 PR Interval:  122 QRS Duration:  96 QT Interval:  458 QTC Calculation: 417 R Axis:   29  Text Interpretation: Sinus bradycardia Confirmed by Vivienne Moyer [phone]) on 07/19/2024 11:37:49 AM  - no acute changes.  Lab Results  Component Value Date   WBC 9.5 08/28/2023   HGB 11.8 08/28/2023   HCT 36.9 08/28/2023   MCV 94 08/28/2023   PLT 384 08/28/2023   Lab Results  Component Value Date   CREATININE 1.00 02/19/2024   BUN 16 02/19/2024   NA 141 02/19/2024   K 4.7 02/19/2024   CL 104 02/19/2024   CO2 22 02/19/2024   Lab Results  Component Value Date   ALT 25 08/17/2023   AST 39 08/17/2023   ALKPHOS 72 08/17/2023   BILITOT 0.6 08/17/2023   Lab Results  Component Value Date   CHOL 116 08/17/2023   HDL 52 08/17/2023   LDLCALC 53 08/17/2023   TRIG 54 08/17/2023   CHOLHDL 2.2 08/17/2023    Lab Results  Component Value Date   HGBA1C 5.4 08/17/2023   Lab Results  Component Value Date   TSH 4.010 02/19/2024       Assessment & Plan    1.  Sinus bradycardia: Patient tracks her heart rates regularly at home and now has an Apple Watch which she is set to alert her when heart rates drop below 50.  She has received multiple alerts during periods of sleep either in the middle of  the night or while napping in the afternoon, with heart rates in the  high 30s to 40s.  She is very anxious about this.  I reviewed the strips that were available on her phone today and all show sinus bradycardia with normal AV conduction.  She denies any history of presyncope or syncope.  Her resting heart rate today is 50 without any acute ST or T changes.  She did stop carvedilol  about a month or 2 ago and I agree that she should remain off of beta-blocker therapy in the setting of resting bradycardia.  I am arranging for a 2-week ZIO monitor to assess for high-grade heart block and trend bradycardia.  Likewise, I am referring to pulmonology for sleep study given bradycardia during periods of sleep.  STOP-BANG is 3.  2.  Coronary artery disease/left axillary pain: Status post STEMI in November 2024 with severe LAD disease, which was successfully treated with drug-eluting stent.  The apical LAD is known to be occluded.  She has had left chest and axillary pain ever since her MI.  Lexiscan  PET stress in August 2025 was low risk without evidence of apical inferior ischemia, normal LV function, and no regional wall motion abnormalities, and she has been medically managed.  She remains on aspirin , statin, Plavix , and Ranexa  therapy.  No longer on beta-blocker in the setting of sinus bradycardia.  Chest/left axillary pain is atypical given constant nature.  She rarely uses as needed nitrates.  Reassurance provided and we discussed symptoms that might lead us  to repeat ischemic evaluations.    3.  Primary hypertension: Blood pressure stable off of beta-blocker therapy.  4.  Hyperlipidemia: LDL of 53 in November of last year.  She remains on atorvastatin  and is tolerating well.  5.  History of subdural hematoma/memory loss: Subdural hematoma November 2024 in the setting of mechanical fall and antiplatelet/Eliquis  therapy.  Eliquis  was discontinued at that time.  6.  History of PE: Right upper lobe segmental and subsegmental PE noted on CTA of the chest during admission in  November 2024.  She was initially on Eliquis  which was DC'd following fall and subdural hematoma.  Per patient, she is status post IVC filter placement in [place] .  She has been seen by hematology locally with follow-up CT of the chest in April 2025 showing no definite evidence of PE.  7.  Chronic HFpEF: Heart rate and blood pressure stable.  Euvolemic on examination.  She has Lasix  at home but has not had to use.  8.  Sleep disordered breathing: Patient with documented sinus bradycardia during periods of sleep on her Apple watch.  Long discussion today about possibility of untreated sleep apnea.  She denies any history of snoring but does nap during the day and has a STOP-BANG of 3.  Referring to pulmonology for sleep evaluation.  9.  Disposition: Follow-up ZIO monitor.  Refer to pulmonology for sleep eval.  Follow-up in clinic in 3 months or sooner if necessary.  Lonni Meager, NP 07/19/2024, 1:02 PM

## 2024-07-19 NOTE — Patient Instructions (Signed)
 Medication Instructions:  No changes *If you need a refill on your cardiac medications before your next appointment, please call your pharmacy*  Lab Work: None ordered If you have labs (blood work) drawn today and your tests are completely normal, you will receive your results only by: MyChart Message (if you have MyChart) OR A paper copy in the mail If you have any lab test that is abnormal or we need to change your treatment, we will call you to review the results.   Follow-Up: At [facility], you and your health needs are our priority.  As part of our continuing mission to provide you with exceptional heart care, our providers are all part of one team.  This team includes your primary Cardiologist (physician) and Advanced Practice Providers or APPs (Physician Assistants and Nurse Practitioners) who all work together to provide you with the care you need, when you need it.  Your next appointment:   3 month(s)  Provider:   Redell Cave, MD    A referral has been placed to pulmonology  We recommend signing up for the patient portal called MyChart.  Sign up information is provided on this After Visit Summary.  MyChart is used to connect with patients for Virtual Visits (Telemedicine).  Patients are able to view lab/test results, encounter notes, upcoming appointments, etc.  Non-urgent messages can be sent to your provider as well.   To learn more about what you can do with MyChart, go to [email or web address].   Other Instructions ZIO AT Long term monitor-Live Telemetry  Your physician has requested you wear a ZIO patch monitor for 14 days.  This is a single patch monitor. [employer] supplies one patch monitor per enrollment. Additional  stickers are not available.  Please do not apply patch if you will be having a Nuclear Stress Test, Echocardiogram, Cardiac CT, MRI,  or Chest Xray during the period you would be wearing the monitor. The patch cannot be worn during   these tests. You cannot remove and re-apply the ZIO AT patch monitor.  Your ZIO patch monitor will be mailed 3 day USPS to your address on file. It may take 3-5 days to  receive your monitor after you have been enrolled.  Once you have received your monitor, please review the enclosed instructions. Your monitor has  already been registered assigning a specific monitor serial # to you.   Billing and Patient Assistance Program information  [place] has been supplied with any insurance information on record for billing. [employer] offers a sliding scale Patient Assistance Program for patients without insurance, or whose  insurance does not completely cover the cost of the ZIO patch monitor. You must apply for the  Patient Assistance Program to qualify for the discounted rate. To apply, call [employer] at [phone],  select option 4, select option 2 , ask to apply for the Patient Assistance Program, (you can request an  interpreter if needed). [employer] will ask your household income and how many people are in your  household. [employer] will quote your out-of-pocket cost based on this information. They will also be able  to set up a 12 month interest free payment plan if needed.  Applying the monitor   Shave hair from upper left chest.  Hold the abrader disc by orange tab. Rub the abrader in 40 strokes over left upper chest as indicated in  your monitor instructions.  Clean area with 4 enclosed alcohol pads. Use all pads to ensure the area is cleaned thoroughly.  Let  dry.  Apply patch as indicated in monitor instructions. Patch will be placed under collarbone on left side of  chest with arrow pointing upward.  Rub patch adhesive wings for 2 minutes. Remove the white label marked 1. Remove the white label  marked 2. Rub patch adhesive wings for 2 additional minutes.  While looking in a mirror, press and release button in center of patch. A small green light will flash 3-4  times. This will be  your only indicator that the monitor has been turned on.  Do not shower for the first 24 hours. You may shower after the first 24 hours.  Press the button if you feel a symptom. You will hear a small click. Record Date, Time and Symptom in  the Patient Log.   Starting the Gateway  In your kit there is a Audiological scientist box the size of a cellphone. This is Buyer, retail. It transmits all your  recorded data to [facility]. This box must always stay within 10 feet of you. Open the box and push the *  button. There will be a light that blinks orange and then green a few times. When the light stops  blinking, the Gateway is connected to the ZIO patch. Call [employer] at [phone] to confirm your monitor is transmitting.  Returning your monitor  Remove your patch and place it inside the Gateway. In the lower half of the Gateway there is a white  bag with prepaid postage on it. Place Gateway in bag and seal. Mail package back to [place] as soon as  possible. Your physician should have your final report approximately 7 days after you have mailed back  your monitor. Call [facility] Customer Care at [phone] if you have questions regarding your ZIO AT  patch monitor. Call them immediately if you see an orange light blinking on your monitor.  If your monitor falls off in less than 4 days, contact our Monitor department at [phone]. If your  monitor becomes loose or falls off after 4 days call [employer] at [phone] for suggestions on  securing your monitor

## 2024-07-20 ENCOUNTER — Ambulatory Visit: Admitting: Sleep Medicine

## 2024-07-20 ENCOUNTER — Encounter: Payer: Self-pay | Admitting: Sleep Medicine

## 2024-07-20 VITALS — BP 138/84 | HR 58 | Temp 97.6°F | Ht 64.0 in | Wt 153.8 lb

## 2024-07-20 DIAGNOSIS — I2510 Atherosclerotic heart disease of native coronary artery without angina pectoris: Secondary | ICD-10-CM | POA: Diagnosis not present

## 2024-07-20 DIAGNOSIS — G4733 Obstructive sleep apnea (adult) (pediatric): Secondary | ICD-10-CM | POA: Diagnosis not present

## 2024-07-20 NOTE — Progress Notes (Addendum)
 Name:Alicia Moyer MRN: [id number] DOB: 1961/09/26   CHIEF COMPLAINT:  RULE OUT OSA   HISTORY OF PRESENT ILLNESS: Alicia Moyer is a 63 y.o. w/ a h/o hyperlipidemia and CAD who presents for c/o excessive daytime sleepiness which has been present for a few weeks. Denies any nocturnal awakenings. Denies any significant weight changes. Denies morning headaches, RLS symptoms, dream enactment, cataplexy, hypnagogic or hypnapompic hallucinations. Denies a family history of sleep apnea. Denies drowsy driving. Drinks 1 soda daily, denies alcohol, tobacco or illicit drug use.   Bedtime 11 pm Sleep onset 1 hour  Rise time 8-9 am   EPWORTH SLEEP SCORE    07/20/2024    3:25 PM  Results of the Epworth flowsheet  Sitting and reading 0  Watching TV 0  Sitting, inactive in a public place (e.g. a theatre or a meeting) 0  As a passenger in a car for an hour without a break 1  Lying down to rest in the afternoon when circumstances permit 0  Sitting and talking to someone 0  Sitting quietly after a lunch without alcohol 0  In a car, while stopped for a few minutes in traffic 0  Total score 1    PAST MEDICAL HISTORY :   has a past medical history of Arthritis, CAD (coronary artery disease), Chronic heart failure with preserved ejection fraction (HFpEF) (HCC), Chronic pain, CSF abnormal (07/15/2014), Fibromyalgia (07/15/2014), Hyperlipidemia LDL goal <70, Hypertension, Memory loss, Myocardial infarction (HCC), Palpitations, Pulmonary embolism (HCC), Scoliosis, and Subdural hematoma (HCC).  has a past surgical history that includes Hip replacement right; Abdominal hysterectomy (2000); Dilation and curettage of uterus; Coronary/Graft Acute MI Revascularization (N/A, 08/17/2023); LEFT HEART CATH AND CORONARY ANGIOGRAPHY (N/A, 08/17/2023); and CORONARY IMAGING/OCT (N/A, 08/17/2023). Prior to Admission medications   Medication Sig Start Date End Date Taking? Authorizing Provider  aspirin  EC 81 MG tablet  Take 81 mg by mouth daily. Swallow whole.    [provider]  atorvastatin  (LIPITOR) 40 MG tablet Take 1 tablet (40 mg total) by mouth daily. 02/19/24   Vivienne Lonni Ingle, NP  baclofen (LIORESAL) 10 MG tablet Take 10 mg by mouth 3 (three) times daily.    [provider]  carvedilol  (COREG ) 3.125 MG tablet Take 1 tablet (3.125 mg total) by mouth 2 (two) times daily. Patient not taking: Reported on 07/19/2024 01/15/24   Loistine Sober, NP  clopidogrel  (PLAVIX ) 75 MG tablet Take 75 mg by mouth daily.    [provider]  furosemide  (LASIX ) 20 MG tablet Take 1 tablet (20 mg total) by mouth daily as needed for edema (swelling in lower legs). 01/15/24   Loistine Sober, NP  gabapentin  (NEURONTIN ) 600 MG tablet Take 1 tablet (600 mg total) by mouth 2 (two) times daily. 08/21/23   Alexander, Natalie, DO  nitroGLYCERIN  (NITROSTAT ) 0.4 MG SL tablet Place 1 tablet (0.4 mg total) under the tongue every 5 (five) minutes as needed for chest pain. 12/04/23   Loistine Sober, NP  ondansetron  (ZOFRAN -ODT) 4 MG disintegrating tablet Take by mouth. 09/05/23   [provider]  oxyCODONE  ER (XTAMPZA  ER) 27 MG C12A Take by mouth. 09/05/23   [provider]  Oxycodone  HCl 10 MG TABS Take 1 tablet by mouth 4 (four) times daily as needed (pain). Patient not taking: Reported on 07/19/2024    [provider]  Oxycodone  HCl 10 MG TABS Take 10 mg by mouth in the morning, at noon, in the evening, and at bedtime.  07/16/23   [provider]  pantoprazole  (PROTONIX ) 20 MG tablet Take 1 tablet (20 mg total) by mouth daily. Patient not taking: Reported on 07/19/2024 08/22/23   Alexander, Natalie, DO  ranolazine  (RANEXA ) 500 MG 12 hr tablet Take 1 tablet (500 mg total) by mouth 2 (two) times daily. 05/18/24   Darliss Rogue, MD  traMADol  (ULTRAM -ER) 200 MG 24 hr tablet Take 200 mg by mouth daily. Patient not taking: Reported on 07/19/2024 05/07/23    [provider]   Allergies  Allergen Reactions   Clindamycin Anaphylaxis and Itching    Other Reaction(s): Not available  Rash,swelling of face and neck   Clindamycin/Lincomycin Anaphylaxis    Rash,swelling of face and neck   Zolpidem Other (See Comments)    Other Reaction(s): Not available    FAMILY HISTORY:  family history includes Aneurysm in an other family member; Cancer in her father. SOCIAL HISTORY:  reports that she has never smoked. She does not have any smokeless tobacco history on file. She reports that she does not drink alcohol and does not use drugs.   Review of Systems:  Gen:  Denies  fever, sweats, chills weight loss  HEENT: Denies blurred vision, double vision, ear pain, eye pain, hearing loss, nose bleeds, sore throat Cardiac:  No dizziness, chest pain or heaviness, chest tightness,edema, No JVD Resp:   No cough, -sputum production, -shortness of breath,-wheezing, -hemoptysis,  Gi: Denies swallowing difficulty, stomach pain, nausea or vomiting, diarrhea, constipation, bowel incontinence Gu:  Denies bladder incontinence, burning urine Ext:   Denies Joint pain, stiffness or swelling Skin: Denies  skin rash, easy bruising or bleeding or hives Endoc:  Denies polyuria, polydipsia , polyphagia or weight change Psych:   Denies depression, insomnia or hallucinations  Other:  All other systems negative  VITAL SIGNS: BP 138/84   Pulse (!) 58   Temp 97.6 F (36.4 C) (Temporal)   Ht 5' 4 (1.626 m)   Wt 153 lb 12.8 oz (69.8 kg)   SpO2 98%   BMI 26.40 kg/m    Physical Examination:   General Appearance: No distress  EYES PERRLA, EOM intact.   NECK Supple, No JVD Pulmonary: normal breath sounds, No wheezing.  CardiovascularNormal S1,S2.  No m/r/g.   Abdomen: Benign, Soft, non-tender. Skin:   warm, no rashes, no ecchymosis  Extremities: normal, no cyanosis, clubbing. Neuro:without focal findings,  speech normal  PSYCHIATRIC: Mood, affect within  normal limits.   ASSESSMENT AND PLAN  OSA I suspect that OSA is likely present due to clinical presentation. Discussed the consequences of untreated sleep apnea. Advised not to drive drowsy for safety of patient and others. Will complete further evaluation with a home sleep study and follow up to review results.    CAD Stable, on current management. Following with PCP.    MEDICATION ADJUSTMENTS/LABS AND TESTS ORDERED: Recommend Sleep Study   Patient  satisfied with Plan of action and management. All questions answered  Follow up to review HST results and treatment plan.   I spent a total of 32 minutes reviewing chart data, face-to-face evaluation with the patient, counseling and coordination of care as detailed above.    Kendall Justo, M.D.  Sleep Medicine [facility]

## 2024-07-20 NOTE — Patient Instructions (Signed)
 SABRA

## 2024-07-29 ENCOUNTER — Other Ambulatory Visit: Payer: Self-pay | Admitting: Cardiology

## 2024-08-09 ENCOUNTER — Ambulatory Visit (INDEPENDENT_AMBULATORY_CARE_PROVIDER_SITE_OTHER)

## 2024-08-09 ENCOUNTER — Encounter (INDEPENDENT_AMBULATORY_CARE_PROVIDER_SITE_OTHER): Payer: Self-pay | Admitting: Vascular Surgery

## 2024-08-09 ENCOUNTER — Ambulatory Visit (INDEPENDENT_AMBULATORY_CARE_PROVIDER_SITE_OTHER): Admitting: Vascular Surgery

## 2024-08-09 VITALS — BP 146/84 | HR 50 | Resp 16 | Ht 64.0 in | Wt 151.8 lb

## 2024-08-09 DIAGNOSIS — I2699 Other pulmonary embolism without acute cor pulmonale: Secondary | ICD-10-CM | POA: Diagnosis not present

## 2024-08-09 DIAGNOSIS — E785 Hyperlipidemia, unspecified: Secondary | ICD-10-CM | POA: Diagnosis not present

## 2024-08-09 DIAGNOSIS — Z95828 Presence of other vascular implants and grafts: Secondary | ICD-10-CM | POA: Diagnosis not present

## 2024-08-09 DIAGNOSIS — I10 Essential (primary) hypertension: Secondary | ICD-10-CM

## 2024-08-09 DIAGNOSIS — S065XAA Traumatic subdural hemorrhage with loss of consciousness status unknown, initial encounter: Secondary | ICD-10-CM

## 2024-08-09 NOTE — Assessment & Plan Note (Signed)
 At this point, she no longer needs the IVC filter.  However, if she has to have the urologic surgery that may be upcoming, I would prefer to have this done first before we take out the filter.  She is contemplating this and will let us  know if she has her urologic surgery and when we can remove the filter.  Also, if she has to have a pacemaker or something for her bradycardia, I would probably wait until after that before to remove the filter.  We discussed it is now been about a year and the filter may not be retrievable, but as long as the hook is not embedded I think it still reasonable to attempt retrieval to avoid the small risk of filter thrombosis, migration, or fracture in the future.

## 2024-08-09 NOTE — Progress Notes (Signed)
 MRN : [id number]  Alicia Moyer is a 63 y.o. (12/09/1960) female who presents with chief complaint of  Chief Complaint  Patient presents with   Follow-up    2 month dvt u/s follow up  .  History of Present Illness: Patient returns today in follow up of IVC filter and previous pulmonary embolus.  A DVT study was done today showing no evidence of deep venous thrombosis, superficial thrombophlebitis, or significant venous reflux in either lower extremity.  She is still contemplating whether or not to have her urologic surgery.  She is also having some bradycardia episodes and currently is wearing a Holter monitor and follows up with cardiology to discuss this in the near future.  Current Outpatient Medications  Medication Sig Dispense Refill   aspirin  EC 81 MG tablet Take 81 mg by mouth daily. Swallow whole.     atorvastatin  (LIPITOR) 40 MG tablet Take 1 tablet (40 mg total) by mouth daily. 90 tablet 3   baclofen (LIORESAL) 10 MG tablet Take 10 mg by mouth 3 (three) times daily.     clopidogrel  (PLAVIX ) 75 MG tablet Take 75 mg by mouth daily.     furosemide  (LASIX ) 20 MG tablet Take 1 tablet (20 mg total) by mouth daily as needed for edema (swelling in lower legs). 90 tablet 3   gabapentin  (NEURONTIN ) 600 MG tablet Take 1 tablet (600 mg total) by mouth 2 (two) times daily.     nitroGLYCERIN  (NITROSTAT ) 0.4 MG SL tablet Place 1 tablet (0.4 mg total) under the tongue every 5 (five) minutes as needed for chest pain. 25 tablet 3   ondansetron  (ZOFRAN -ODT) 4 MG disintegrating tablet Take by mouth.     Oxycodone  HCl 10 MG TABS Take 1 tablet by mouth 4 (four) times daily as needed (pain).     Oxycodone  HCl 10 MG TABS Take 10 mg by mouth in the morning, at noon, in the evening, and at bedtime.     pantoprazole  (PROTONIX ) 20 MG tablet Take 1 tablet (20 mg total) by mouth daily. 30 tablet 0   ranolazine  (RANEXA ) 500 MG 12 hr tablet TAKE 1 TABLET BY MOUTH TWICE DAILY 90 tablet 0   carvedilol  (COREG )  3.125 MG tablet Take 1 tablet (3.125 mg total) by mouth 2 (two) times daily. (Patient not taking: Reported on 08/09/2024) 180 tablet 3   oxyCODONE  ER (XTAMPZA  ER) 27 MG C12A Take by mouth. (Patient not taking: Reported on 08/09/2024)     traMADol  (ULTRAM -ER) 200 MG 24 hr tablet Take 200 mg by mouth daily. (Patient not taking: Reported on 08/09/2024)     No current facility-administered medications for this visit.    Past Medical History:  Diagnosis Date   Arthritis    CAD (coronary artery disease)    a. 08/2024 Inferior STEMI/PCI:  LM nl, LAD 70p (4.0x15 Onyx Frontier DES), 30d, 100d (embolized thrombus), LCX nl, RCA nl, EF 55-65%; b. 05/2024 PET Stress: small reversible apical inferior defect. EF 66%. No rwma. Low risk.   Chronic heart failure with preserved ejection fraction (HFpEF) (HCC)    a. 01/2024 Echo: EF 60-65%, no rwma, GrI DD, nl RV, RVSP 32.4 mmHg. Mild MR. Mild to mod TR.   Chronic pain    CSF abnormal 07/15/2014   Fibromyalgia 07/15/2014   Hyperlipidemia LDL goal <70    Hypertension    Memory loss    Myocardial infarction [facility])    Palpitations    Pulmonary embolism (HCC)    a.  08/2023 CTA Chest: Right upper lobe segmental and subsegmental PE-->Eliquis  followed by IVC filter in [place] when Eliquis  d/c in 08/2023 due to SDH.   Scoliosis    Subdural hematoma (HCC)    a. 08/2023 Fall-->SDH-->Eliquis  D/c'd.  Asa/Plavix  held x 2 wks.    Past Surgical History:  Procedure Laterality Date   ABDOMINAL HYSTERECTOMY  2000   CORONARY IMAGING/OCT N/A 08/17/2023   Procedure: CORONARY IMAGING/OCT;  Surgeon: [name], MD;  Location: [facility] INVASIVE CV LAB;  Service: Cardiovascular;  Laterality: N/A;   CORONARY/GRAFT ACUTE MI REVASCULARIZATION N/A 08/17/2023   Procedure: Coronary/Graft Acute MI Revascularization;  Surgeon: [name], MD;  Location: [facility] INVASIVE CV LAB;  Service: Cardiovascular;  Laterality: N/A;   DILATION AND CURETTAGE OF UTERUS     Hip replacement right      x6   LEFT HEART CATH AND CORONARY ANGIOGRAPHY N/A 08/17/2023   Procedure: LEFT HEART CATH AND CORONARY ANGIOGRAPHY;  Surgeon: [name], MD;  Location: [facility] INVASIVE CV LAB;  Service: Cardiovascular;  Laterality: N/A;     Social History   Tobacco Use   Smoking status: Never  Vaping Use   Vaping status: Never Used  Substance Use Topics   Alcohol use: Never   Drug use: Never       Family History  Problem Relation Age of Onset   Cancer Father    Aneurysm Other         grandmother     Allergies  Allergen Reactions   Clindamycin Anaphylaxis and Itching    Other Reaction(s): Not available  Rash,swelling of face and neck   Clindamycin/Lincomycin Anaphylaxis    Rash,swelling of face and neck   Zolpidem Other (See Comments)    Other Reaction(s): Not available     REVIEW OF SYSTEMS (Negative unless checked)   Constitutional: [] Weight loss  [] Fever  [] Chills Cardiac: [] Chest pain   [] Chest pressure   [] Palpitations   [] Shortness of breath when laying flat   [] Shortness of breath at rest   [] Shortness of breath with exertion. Vascular:  [] Pain in legs with walking   [] Pain in legs at rest   [] Pain in legs when laying flat   [] Claudication   [] Pain in feet when walking  [] Pain in feet at rest  [] Pain in feet when laying flat   [x] History of DVT   [] Phlebitis   [x] Swelling in legs   [] Varicose veins   [] Non-healing ulcers Pulmonary:   [] Uses home oxygen   [] Productive cough   [] Hemoptysis   [] Wheeze  [] COPD   [] Asthma Neurologic:  [] Dizziness  [] Blackouts   [] Seizures   [x] History of stroke   [] History of TIA  [] Aphasia   [] Temporary blindness   [] Dysphagia   [] Weakness or numbness in arms   [] Weakness or numbness in legs Musculoskeletal:  [x] Arthritis   [] Joint swelling   [x] Joint pain   [] Low back pain Hematologic:  [] Easy bruising  [] Easy bleeding   [] Hypercoagulable state   [] Anemic  [] Hepatitis Gastrointestinal:  [] Blood in stool   [] Vomiting blood  [] Gastroesophageal  reflux/heartburn   [] Abdominal pain Genitourinary:  [] Chronic kidney disease   [] Difficult urination  [x] Frequent urination  [] Burning with urination   [] Hematuria Skin:  [] Rashes   [] Ulcers   [] Wounds Psychological:  [] History of anxiety   []  History of major depression.    Physical Examination  BP (!) 146/84   Pulse (!) 50   Resp 16   Ht 5' 4 (1.626 m)   Wt 151 lb 12.8 oz (  68.9 kg)   BMI 26.06 kg/m  Gen:  WD/WN, NAD Head: /AT, No temporalis wasting. Ear/Nose/Throat: Hearing grossly intact, nares w/o erythema or drainage Eyes: Conjunctiva clear. Sclera non-icteric Neck: Supple.  Trachea midline Pulmonary:  Good air movement, no use of accessory muscles.  Cardiac: bradycardia Vascular:  Vessel Right Left  Radial Palpable Palpable           Musculoskeletal: M/S 5/5 throughout.  No deformity or atrophy. Trace LE edema. Neurologic: Sensation grossly intact in extremities.  Symmetrical.  Speech is fluent.  Psychiatric: Judgment intact, Mood & affect appropriate for pt's clinical situation. Dermatologic: No rashes or ulcers noted.  No cellulitis or open wounds.      Labs Recent Results (from the past 2160 hours)  NM PET CT CARDIAC PERFUSION MULTI W/ABSOLUTE BLOODFLOW     Status: None   Collection Time: 05/12/24  9:07 AM  Result Value Ref Range   Rest Nuclear Isotope Dose 18.5 mCi   Stress Nuclear Isotope Dose 18.5 mCi   Rest HR 53.0 bpm   Rest BP 131/64 mmHg   Peak HR 78 bpm   Peak BP 127/68 mmHg   SSS 2.0    SRS 0.0    TID 1.02    Nuc Stress EF 67 %   Nuc Rest EF 66 %   ST Depression (mm) 0 mm   LV sys vol 30.0 3.8 - 5.2 mL   LV dias vol 888.0 46 - 106 mL   Rest MBF 0.88 ml/g/min   Stress MBF 2.05 ml/g/min   MBFR 2.33     Radiology No results found.  Assessment/Plan  Pulmonary embolism (HCC) Had an IVC filter placed.  Cannot be on anticoagulation with her brain bleed.  This is now a year or more and she has no current DVT.  Presence of IVC  filter At this point, she no longer needs the IVC filter.  However, if she has to have the urologic surgery that may be upcoming, I would prefer to have this done first before we take out the filter.  She is contemplating this and will let us  know if she has her urologic surgery and when we can remove the filter.  Also, if she has to have a pacemaker or something for her bradycardia, I would probably wait until after that before to remove the filter.  We discussed it is now been about a year and the filter may not be retrievable, but as long as the hook is not embedded I think it still reasonable to attempt retrieval to avoid the small risk of filter thrombosis, migration, or fracture in the future.   Hyperlipidemia LDL goal <70 lipid control important in reducing the progression of atherosclerotic disease. Continue statin therapy     Subdural hematoma (HCC) Not on anticoagulation for this reason   Essential hypertension blood pressure control important in reducing the progression of atherosclerotic disease. On appropriate oral medications.  Selinda Gu, MD  08/09/2024 11:09 AM    This note was created with Dragon medical transcription system.  Any errors from dictation are purely unintentional

## 2024-08-09 NOTE — Patient Instructions (Signed)
 Inferior Vena Cava Filter Removal  Inferior vena cava filter removal is a procedure to take out a metal filter that was placed into a large vein in the abdomen (inferior vena cava, or IVC). An IVC filter prevents blood clots in the legs or pelvis from traveling to the lungs. Some IVC filters are designed to be removed (retrievable filters). You may have your filter removed when the danger of forming blood clots has passed or when you can take blood-thinning medicine to prevent blood clots. In some cases, the filter may need to be removed because it becomes damaged, is not working, or is causing problems. Most filters can be removed through the vein (percutaneously). In the rare cases when the health care provider is unable to remove the filter percutaneously, one of these steps may be taken: A more invasive, open surgery. The filter may be left in place. Tell a health care provider about: Any allergies you have, including iodine. All medicines you are taking, including vitamins, herbs, eye drops, creams, and over-the-counter medicines. Any problems you or family members have had with anesthetic medicines or with contrast dyes that are used during imaging tests. Any bleeding problems you have. Any surgeries you have had. Any medical conditions you have. Whether you are pregnant or may be pregnant. What are the risks? Generally, this is a safe procedure. However, serious problems may occur, especially if the filter has been in for more than a few months. Possible problems include: Infection. Bleeding. Allergic reactions to medicines or dyes. Damage to the IVC, other blood vessels, or nearby structures. A blood clot or a piece of the filter breaking loose and traveling to the lungs. What happens before the procedure? When to stop eating and drinking Follow instructions from your health care provider about what you may eat and drink before your procedure. These may include: 8 hours before your  procedure Stop eating most foods. Do not eat meat, fried foods, or fatty foods. Eat only light foods, such as toast or crackers. All liquids are okay except energy drinks and alcohol. 6 hours before your procedure Stop eating. Drink only clear liquids, such as water, clear fruit juice, black coffee, plain tea, and sports drinks. Do not drink energy drinks or alcohol. 2 hours before your procedure Stop drinking all liquids. You may be allowed to take medicines with small sips of water. If you do not follow your health care provider's instructions, your procedure may be delayed or canceled. Medicines Ask your health care provider about: Changing or stopping your regular medicines. This is especially important if you are taking diabetes medicines or blood thinners. Taking medicines such as aspirin and ibuprofen. These medicines can thin your blood. Do not take these medicines unless your health care provider tells you to take them. Taking over-the-counter medicines, vitamins, herbs, and supplements. General instructions Do not use any products that contain nicotine or tobacco for at least 4 weeks before the procedure. These products include cigarettes, chewing tobacco, and vaping devices, such as e-cigarettes. If you need help quitting, ask your health care provider. Ask your health care provider: How your surgery site will be marked. What steps will be taken to help prevent infection. These steps may include: Removing hair at the surgery site. Washing skin with a germ-killing soap. Receiving antibiotic medicine. If you will be going home right after the procedure, plan to have a responsible adult: Take you home from the hospital or clinic. You will not be allowed to drive. Care for  you for the time you are told. What happens during the procedure? An IV will be inserted into one of your veins. You will be given one or more of the following: A medicine to help you relax (sedative). A  medicine to numb the area (local anesthetic). A medicine to make you fall asleep (general anesthetic). The procedure will be done through a vein in your groin or neck that leads to the IVC. A small incision will be made over the vein. A long, thin tube (catheter) will be inserted into the vein. The catheter will be moved through your vein and into your IVC. X-rays may be done to help guide the catheter into place. Dye may be injected through the catheter before the X-rays to make the catheter and filter easier to see. When the catheter reaches the filter, a hook (snare) on the end of the catheter may be used to latch on to the filter. In some cases, a grasping instrument (forceps) may be threaded through the catheter to gently grab and remove the filter instead. After the filter has been hooked or grasped, the filter and instruments will be pulled out through the catheter. The catheter will be removed through the insertion site in your skin. Pressure will be placed over your insertion site until bleeding stops. A bandage (dressing) will be placed over the catheter insertion site. The procedure may vary among health care providers and hospitals. What happens after the procedure? Your blood pressure, heart rate, breathing rate, and blood oxygen level will be monitored until you leave the hospital or clinic. You may need to stay in bed (be on bed rest) for a period of time. Your insertion site will be monitored for the first few hours for any signs of bleeding. If you were given a sedative during the procedure, it can affect you for several hours. Do not drive or operate machinery until your health care provider says that it is safe. Summary Inferior vena cava (IVC) filter removal is a procedure to take out a filter that was placed to prevent blood clots from traveling to your lungs. You may have your filter removed when the danger of forming blood clots has passed or when you can take  blood-thinning medicines to prevent blood clots. In some cases, a filter is removed because there is a problem with it. A long, thin tube (catheter) will be inserted through a vein in your neck or groin. Then, the filter will be gently grasped and pulled out through the catheter. Plan to have a responsible adult care for you for the time you are told. This information is not intended to replace advice given to you by your health care provider. Make sure you discuss any questions you have with your health care provider. Document Revised: 10/08/2021 Document Reviewed: 10/08/2021 Elsevier Patient Education  2024 [employer].

## 2024-08-09 NOTE — Assessment & Plan Note (Signed)
 Had an IVC filter placed.  Cannot be on anticoagulation with her brain bleed.  This is now a year or more and she has no current DVT.

## 2024-08-18 ENCOUNTER — Ambulatory Visit: Admitting: Cardiology

## 2024-08-29 DIAGNOSIS — R001 Bradycardia, unspecified: Secondary | ICD-10-CM

## 2024-08-30 ENCOUNTER — Ambulatory Visit: Payer: Self-pay | Admitting: Nurse Practitioner

## 2024-09-06 ENCOUNTER — Telehealth: Payer: Self-pay | Admitting: Nurse Practitioner

## 2024-09-06 DIAGNOSIS — R9431 Abnormal electrocardiogram [ECG] [EKG]: Secondary | ICD-10-CM

## 2024-09-06 NOTE — Telephone Encounter (Signed)
Pt returning call for results. Please advise.  

## 2024-09-06 NOTE — Telephone Encounter (Signed)
 Called patient to review monitor results  Patient verbalized that she was not instructed to press button or record anything that she felt - upon reading provider's result - most of the symptomatic episodes did occur with patient awake, she noted that she would get slightly nauseated and dizzy during wearing the monitor, she would sit down and could be out for up to as much as 5-10 min, denied any falls  Patient is trying to schedule home sleep study - delayed due to financial issues. Had OV with PCP on 11/26 where she had elevated BP of 136/85 with HR of 52. She was started on losartan  25 mg daily  at this visit. I have scheduled her for an office visit 09/08/24 with KYM Meager for additional monitor follow up. EP referral placed for SVT/abnormal monitor today - patient preference is to be seen in [place]

## 2024-09-06 NOTE — Telephone Encounter (Signed)
 Pt returning nurses call from yesterday regarding Monitor results. Please advise

## 2024-09-07 NOTE — Progress Notes (Unsigned)
 Electrophysiology Clinic Note    Date:  09/08/2024  Patient ID:  Alicia Moyer [birth year], MRN [id number] PCP:  [name], MD  Cardiologist:  Redell Cave, MD  Electrophysiology APP:  Laylia Mui, NP     Discussed the use of AI scribe software for clinical note transcription with the patient, who gave verbal consent to proceed.   Patient Profile    Chief Complaint: SND  History of Present Illness: Alicia Moyer is a 62 y.o. female with PMH notable for CAD s/p PCI (2024), PE (2024) s/p IVC filter , HFpEF, HTN, HLD, NSVT, chronic pain, fibromyalgia; seen today for electrophysiology evaluation of sinus pauses as referred by NP Vivienne.   She last saw NP Vivienne in clinic 07/2024 at which time she was very concerned about her bradycardia noted on Apple watch which was alerting her during sleep and naps if heart rate dropped below 50.  It did not alert her during daytime, waking hours.  She had self stopped her Coreg  prior to the visit, renexa was continued. It was also noted during that visit that she has somewhat chronic DOE and left axillary pain. She was referred to pulm at that appt for concern for OSA, has seen pulm but has not completed home sleep test yet. He ordered zio monitor to further eval which recently resulted and revealed several daytime pauses that were not associated with symptom activation.  When she was called about her Zio results she indicated that she was not instructed to press the button but did have multiple episodes of feeling slightly nauseated and dizzy where she would sit down and pass out for 5 to 10 minutes. She was subsequently referred to EP.   She provides further details regarding her syncopal episodes that begin with a several-minute prodrome of feeling faint and nauseated, followed by loss of consciousness for about ten minutes. She does not recall exactly when these episodes happened while she was wearing the zio monitor.   Separately,  she has episodes where she feels her heart racing, these have occurred during the day and woken her up in the middle of the night. These episodes are brief lasting a few seconds to minutes, but not significantly longer than that. She also does not recall when these specific episodes happened while wearing monitor.   She denies recent tick bites.   Currently, she denies chest pain, chest pressure, palpitations, SOB, or increased edema.      Arrhythmia/Device History No specialty comments available.    ROS:  Please see the history of present illness. All other systems are reviewed and otherwise negative.    Physical Exam    VS:  BP 118/70 (BP Location: Left Arm, Patient Position: Sitting, Cuff Size: Normal)   Pulse (!) 53   Ht 5' 4 (1.626 m)   Wt 154 lb 9.6 oz (70.1 kg)   SpO2 97%   BMI 26.54 kg/m  BMI: Body mass index is 26.54 kg/m.  Orthostatic VS for the past 24 hrs (Last 3 readings):  BP- Lying Pulse- Lying BP- Sitting Pulse- Sitting BP- Standing at 0 minutes Pulse- Standing at 0 minutes BP- Standing at 3 minutes Pulse- Standing at 3 minutes  09/08/24 1114 130/80 (!) 47 146/90 55 (!) 157/96 53 (!) 159/98 52   STOP-Bang Score:  3         Wt Readings from Last 3 Encounters:  09/08/24 154 lb 9.6 oz (70.1 kg)  08/09/24 151 lb  12.8 oz (68.9 kg)  07/20/24 153 lb 12.8 oz (69.8 kg)     GEN- The patient is well appearing, alert and oriented x 3 today.   Lungs- Clear to ausculation bilaterally, normal work of breathing.  Heart- Regular rate and rhythm, no murmurs, rubs or gallops Extremities- Trace peripheral edema, warm, dry   Studies Reviewed   Previous EP, cardiology notes.    EKG is ordered. Personal review of EKG from today shows:    EKG Interpretation Date/Time:  Thursday September 08 2024 11:12:31 EST Ventricular Rate:  53 PR Interval:  120 QRS Duration:  84 QT Interval:  474 QTC Calculation: 444 R Axis:   46  Text Interpretation: Sinus bradycardia  Confirmed by Jahmeer Porche [phone]) on 09/08/2024 11:23:30 AM    Long term zio, 08/22/2024 Patch Wear Time:  13 days and 23 hours (2025-10-21T15:37:27-0400 to 2025-11-04T14:37:27-0500)   Patient had a min HR of 29 bpm, max HR of 156 bpm, and avg HR of 55 bpm. Predominant underlying rhythm was Sinus Rhythm. 15 Supraventricular Tachycardia runs occurred, the run with the fastest interval lasting 6 beats with a max rate of 156 bpm, the  longest lasting 13 beats with an avg rate of 129 bpm. 27 Pauses occurred, the longest lasting 3.8 secs (16 bpm). Isolated SVEs were rare (<1.0%), SVE Couplets were rare (<1.0%), and SVE Triplets were rare (<1.0%). Isolated VEs were rare (<1.0%), and no  VE Couplets or VE Triplets were present. Ventricular Bigeminy was present.    Conclusion Average heart rate 55, range 29-1 56. 15 episodes of nonsustained SVT. 27 pauses noted, longest lasting 3.8 seconds. No atrial fibrillation or atrial flutter. No sustained arrhythmias.  Cardiac PET CT, 05/12/2024   LV perfusion is abnormal. There is evidence of ischemia. There is no evidence of infarction. Defect 1: There is a small defect with moderate reduction in uptake present in the apical inferior location(s) that is reversible. There is normal wall motion in the defect area. Consistent with ischemia.   Rest left ventricular function is normal. Rest EF: 66%. Stress left ventricular function is normal. Stress EF: 67%. End diastolic cavity size is normal. End systolic cavity size is normal.   Myocardial blood flow was computed to be 0.93ml/g/min at rest and 2.21ml/g/min at stress. Global myocardial blood flow reserve was 2.33 and was normal.   Coronary calcium  assessment not performed due to prior revascularization.Due to prior LAD stent   Findings are consistent with ischemia. The study is low risk.   MBFR in small area of inferior apical ischemia 1.76  TTE, 01/05/2024  1. Left ventricular ejection fraction, by estimation, is  60 to 65%. Left ventricular ejection fraction by PLAX is 56 %. The left ventricle has normal function. The left ventricle has no regional wall motion abnormalities. Left ventricular diastolic parameters were normal. The average left ventricular global longitudinal strain is -16.4 %. The global longitudinal strain is abnormal.   2. Right ventricular systolic function is low normal. The right ventricular size is normal. There is moderately elevated pulmonary artery systolic pressure. The estimated right ventricular systolic pressure is 43.8 mmHg.   3. Left atrial size was mildly dilated.   4. The mitral valve is normal in structure. Mild mitral valve regurgitation.   5. Tricuspid valve regurgitation is mild to moderate.   6. The aortic valve is tricuspid. Aortic valve regurgitation is not visualized.   7. The inferior vena cava is normal in size with <50% respiratory variability, suggesting right atrial pressure  of 8 mmHg.    Assessment and Plan     #) SND #) bradycardia #) syncope Patient with ongoing bradycardia with recent zio monitor revealing day-time pauses. During period while wearing monitor, patient did have several syncopal episodes, but does not recall specific dates/times of syncope.  Patient describes syncope episodes with minutes-long prodrome, which does not correlate to brief SND of 3.8 seconds She is not taking any AVN blocking medications Recent TSH normal (via care everywhere) Renexa does have some risks of SND/bradycardia, so will stop today. Monitor for rebound chest pain Obtain 2 week live zio  monitor. I've reiterated to patient to hit button on device when she has syncope and/or prodrome episodes Avoid driving at this time until additional zio is completed  We did briefly discuss PPM and risks/benefits of procedure including post-operative wound care and L shoulder activity restrictions.  #) SVT #) palpitations Separately, patient's zio monitor revealed episodes of  SVT and patient believes she is symptomatic of these episodes, but unable to correlate given lack of symptom activation Update 2 week live zio as above She remains off AVN blocking meds for bradycardia/SND  #) sleep-disordered breathing Zio results highly concerning for OSA Patient is established with pulm, but has not completed home sleep test Encouraged her to continue to follow-up with pulm for further work-up and evaluation of OSA       Current medicines are reviewed at length with the patient today.   The patient does not have concerns regarding her medicines.  The following changes were made today:   STOP renexa  Labs/ tests ordered today include:  Orders Placed This Encounter  Procedures   LONG TERM MONITOR-LIVE TELEMETRY (3-14 DAYS)   EKG 12-Lead     Disposition: Follow up with Dr. Kennyth or EP APP in 3 months, sooner if needed based on zio results     Signed, Cailah Reach, NP  09/08/24  2:07 PM  Electrophysiology [facility]

## 2024-09-08 ENCOUNTER — Ambulatory Visit: Admitting: Nurse Practitioner

## 2024-09-08 ENCOUNTER — Ambulatory Visit: Attending: Cardiology | Admitting: Cardiology

## 2024-09-08 ENCOUNTER — Encounter: Payer: Self-pay | Admitting: Cardiology

## 2024-09-08 ENCOUNTER — Ambulatory Visit

## 2024-09-08 VITALS — BP 118/70 | HR 53 | Ht 64.0 in | Wt 154.6 lb

## 2024-09-08 DIAGNOSIS — I495 Sick sinus syndrome: Secondary | ICD-10-CM

## 2024-09-08 DIAGNOSIS — R001 Bradycardia, unspecified: Secondary | ICD-10-CM

## 2024-09-08 DIAGNOSIS — R002 Palpitations: Secondary | ICD-10-CM

## 2024-09-08 DIAGNOSIS — G4730 Sleep apnea, unspecified: Secondary | ICD-10-CM

## 2024-09-08 DIAGNOSIS — I471 Supraventricular tachycardia, unspecified: Secondary | ICD-10-CM | POA: Diagnosis not present

## 2024-09-08 NOTE — Patient Instructions (Signed)
 Medication Instructions:   Your physician recommends the following medication changes.  STOP TAKING: Coreg  Renexa  *If you need a refill on your cardiac medications before your next appointment, please call your pharmacy*  Lab Work:  None ordered at this time   If you have labs (blood work) drawn today and your tests are completely normal, you will receive your results only by:  MyChart Message (if you have MyChart) OR  A paper copy in the mail If you have any lab test that is abnormal or we need to change your treatment, we will call you to review the results.  Testing/Procedures:  ZIO AT Long term monitor-Live Telemetry  Your physician has requested you wear a ZIO patch monitor for 14 days.  This is a single patch monitor. [employer] supplies one patch monitor per enrollment.  Additional stickers are not available.  Please do not apply patch if you will be having a Nuclear Stress Test, Echocardiogram, Cardiac CT, MRI, or Chest Xray during the period you would be wearing the monitor.  The patch cannot be worn during these tests.  You cannot remove and re-apply the ZIO AT patch monitor.  Your ZIO patch monitor will be mailed 3 day USPS to your address on file. It may take 3-5 days to receive your monitor after you have been enrolled.  Once you have received your monitor, please review the enclosed instructions. Your monitor has already been registered assigning a specific monitor serial number to you.   Billing and Patient Assistance Program information  Meredeth has been supplied with any insurance information on record for billing.  [employer] offers a sliding scale Patient Assistance Program for patients without insurance, or whose insurance does not completely cover the cost of the ZIO patch monitor.  You must apply for the Patient Assistance Program to qualify for the discounted rate.  To apply, call [employer] at [phone], select option 4, select option 2 , ask to apply for the Patient  Assistance Program, (you can request an interpreter if needed).  [employer] will ask your household income and how many people are in your household.  [employer] will quote your out-of-pocket cost based on this information. They will also be able to set up a 12 month interest free payment plan if needed.  Applying the monitor   Shave hair from upper left chest.  Hold the abrader disc by orange tab. Rub the abrader in 40 strokes over left upper chest as indicated in your monitor instructions.  Clean area with 4 enclosed alcohol pads. Use all pads to ensure the area is cleaned thoroughly. Let dry.  Apply patch as indicated in monitor instructions. Patch will be placed under collarbone on left side of chest with arrow pointing upward.  Rub patch adhesive wings for 2 minutes. Remove the white label marked 1. Remove the white label marked 2. Rub patch adhesive wings for 2 additional minutes.  While looking in a mirror, press and release button in center of patch. A small green light will flash 3-4 times.  This will be your only indicator that the monitor has been turned on.   Do not shower for the first 24 hours.  You may shower after the first 24 hours.  Press the button if you feel a symptom.  You will hear a small click.  Record Date, Time and Symptom in the Patient Log.   Starting the Gateway  In your kit there is a audiological scientist box the size of a cellphone.  This is [employer]. It transmits all your recorded data to [facility].  This box must always stay within 10 feet of you.  Open the box and push the * button.  There will be a light that blinks orange and then green a few times.  When the light stops blinking, the Gateway is connected to the ZIO patch. Call [employer] at [phone] to confirm your monitor is transmitting.  Returning your monitor  Remove your patch and place it inside the Gateway.  In the lower half of the Gateway box there is a white bag with prepaid postage on it.  Place Gateway  in bag and seal.  Mail package back to [place] as soon as possible.  Your physician should have your final report approximately 7-14 days after you have mailed back your monitor. Call [facility] Customer Care at [phone] if you have questions regarding your ZIO AT patch monitor.   Call them immediately if you see an orange light blinking on your monitor.  If your monitor falls off in less than 4 days, contact our Monitor department at [phone]. If your monitor becomes loose or falls off after 4 days call [employer] at [phone] for suggestions on securing your monitor.   Referrals:  Please schedule you sleep study with the Pulmonary Clinic  Follow-Up:  At [facility] [place], you and your health needs are our priority.  As part of our continuing mission to provide you with exceptional heart care, our providers are all part of one team.  This team includes your primary Cardiologist (physician) and Advanced Practice Providers or APPs (Physician Assistants and Nurse Practitioners) who all work together to provide you with the care you need, when you need it.  Your next appointment:    Next available appointment  Provider:    Fonda Kitty, MD    We recommend signing up for the patient portal called MyChart.  Sign up information is provided on this After Visit Summary.  MyChart is used to connect with patients for Virtual Visits (Telemedicine).  Patients are able to view lab/test results, encounter notes, upcoming appointments, etc.  Non-urgent messages can be sent to your provider as well.   To learn more about what you can do with MyChart, go to [email or web address].   Other Instructions  Since you are being taken off Renexa, you may experience some rebound chest pain.  If this happens, please call us  and let us  know.  If you experience any syncope, lightheadedness that leads to syncope, or very low heart rate, please have someone take you to the Emergency Room  or call 911.  Please schedule a sleep study with pulmonary clinic as soon as possible.  You may wear your monitor while you do the sleep study.

## 2024-09-15 ENCOUNTER — Telehealth: Payer: Self-pay | Admitting: Cardiology

## 2024-09-15 NOTE — Telephone Encounter (Signed)
 Pt stated her heart monitor fell off and would a c/b from a nurse to discuss next steps. Please advise

## 2024-09-15 NOTE — Telephone Encounter (Signed)
 Called and spoke with patient. Patient states that her monitor fell off after 2 days. Patient instructed to contact Zio for a replacement. Patient states that she was supposed to receive a live monitor but had received a regular monitor. I have reached to Mliss Miu rep for [employer] for a replacement for Live monitor. Waiting for response from Rep at this time.

## 2024-09-19 ENCOUNTER — Telehealth: Payer: Self-pay | Admitting: Cardiology

## 2024-09-19 NOTE — Telephone Encounter (Signed)
 Alisha from [employer] called in reporting abnormal zio monitor result from live monitor. Patient had symptomatic bradycardia this morning at 1151 am, 40 bpm lasting 60 seconds. Will call patient to check on her now.

## 2024-09-19 NOTE — Telephone Encounter (Signed)
Calling with abnormal results. Please advise  

## 2024-09-19 NOTE — Telephone Encounter (Signed)
 Called patient to check on her. Patient states at the time she was watching tv, she started getting a nauseous and fatigue feeling before pressing her button. She also got notified of low heart rate on her apple watch. She states this happens a couple times a day, when she gets these feelings she will drift off to sleep. She is confused why she is wearing the heart monitor another time, told patient that the reason was because she did not hit her button on her monitor last time, leaving the results confusing on when or if she was experiencing symptoms during these low heart rate periods. Patient states she has been pressing her button more often this time. Will send to Suzann for review.

## 2024-09-19 NOTE — Telephone Encounter (Signed)
 Spoke with Alicia Moyer, Zio rep, patient had received a live monitor. Replacement monitor had been sent to patient.

## 2024-09-20 ENCOUNTER — Telehealth: Payer: Self-pay | Admitting: Cardiology

## 2024-09-20 NOTE — Telephone Encounter (Signed)
 [employer] calling with results for patient. Please advise.  Reference  #[id number]

## 2024-09-20 NOTE — Telephone Encounter (Signed)
 See note on previous phone call.

## 2024-09-20 NOTE — Telephone Encounter (Signed)
 [employer] calling to report urgent results.

## 2024-09-20 NOTE — Telephone Encounter (Signed)
 12/16 13:28:  Attempted to call [employer] back to inquire about monitor results; was on hold with music approx 15-20 minutes;   12/16 15:15 - [employer] called back on STAT phone regarding Results of patient's Live AT Zio Monitor: pt reporting symptoms with a bradycardia event with HR 40 bpm lasting 90 seconds @ 1134 am;  Will forward this information to both Lonni Meager, NP (ordering provider) and Suzann Riddle, NP who have both seen pt recently to obtain recommendations

## 2024-09-20 NOTE — Telephone Encounter (Signed)
 I left a voicemail for Alicia Moyer to return my call. Calling to follow up on an earlier call in regards to Zio monitor questions and to ask patient if she is having physical symptoms that result in pushing the monitor event button or if notifications from a smart watch indicating low heart rate are the reason for the button push.

## 2024-09-21 ENCOUNTER — Other Ambulatory Visit: Payer: Self-pay | Admitting: Nurse Practitioner

## 2024-09-21 ENCOUNTER — Other Ambulatory Visit: Payer: Self-pay | Admitting: Student

## 2024-09-21 NOTE — Telephone Encounter (Signed)
 See previous encounter

## 2024-09-22 NOTE — Telephone Encounter (Signed)
 Last read by [name] at 10:40AM on 09/21/2024.   Patient has not responded to voicemail or My Chart message. Message was opened on 09/21/2024. Suzann Riddle, NP notified.

## 2024-09-24 ENCOUNTER — Encounter

## 2024-09-24 DIAGNOSIS — G4733 Obstructive sleep apnea (adult) (pediatric): Secondary | ICD-10-CM

## 2024-10-03 ENCOUNTER — Telehealth: Payer: Self-pay | Admitting: Cardiology

## 2024-10-03 NOTE — Telephone Encounter (Signed)
 Pt would like a c/b to know what type of sinus medication she can take

## 2024-10-03 NOTE — Telephone Encounter (Signed)
 STAT if HR is under 50 or over 120 (normal HR is 60-100 beats per minute)  What is your heart rate? 103  Do you have a log of your heart rate readings (document readings)? no  Do you have any other symptoms? Has cold

## 2024-10-07 ENCOUNTER — Ambulatory Visit: Admitting: Student

## 2024-10-10 DIAGNOSIS — G4733 Obstructive sleep apnea (adult) (pediatric): Secondary | ICD-10-CM | POA: Diagnosis not present

## 2024-10-10 DIAGNOSIS — R0683 Snoring: Secondary | ICD-10-CM | POA: Diagnosis not present

## 2024-10-13 ENCOUNTER — Ambulatory Visit: Payer: Self-pay

## 2024-10-14 NOTE — Telephone Encounter (Signed)
-----   Message from Devona Skiff, MD sent at 10/12/2024  4:52 PM EST ----- Please notify patient that HST revealed mild OSA, recommend proceeding with APAP therapy set to 4-16 cm H2O, EPR 3 with the Airtouch N30i nasal mask. Please also schedule a 3 month follow up visit if  patient wishes to proceed with CPAP therapy. Thanks

## 2024-10-14 NOTE — Telephone Encounter (Signed)
 LMTCB. E2C2 please advise when patient calls back.

## 2024-10-17 ENCOUNTER — Telehealth: Payer: Self-pay | Admitting: Cardiology

## 2024-10-17 NOTE — Telephone Encounter (Signed)
 Zio printed from fax. Given to triage for review

## 2024-10-18 ENCOUNTER — Encounter: Payer: Self-pay | Admitting: Cardiology

## 2024-10-18 ENCOUNTER — Ambulatory Visit: Attending: Cardiology | Admitting: Cardiology

## 2024-10-18 ENCOUNTER — Ambulatory Visit: Admitting: Cardiology

## 2024-10-18 VITALS — BP 122/64 | HR 60 | Wt 152.4 lb

## 2024-10-18 DIAGNOSIS — I471 Supraventricular tachycardia, unspecified: Secondary | ICD-10-CM

## 2024-10-18 DIAGNOSIS — R001 Bradycardia, unspecified: Secondary | ICD-10-CM | POA: Diagnosis not present

## 2024-10-18 DIAGNOSIS — I495 Sick sinus syndrome: Secondary | ICD-10-CM

## 2024-10-18 DIAGNOSIS — G4733 Obstructive sleep apnea (adult) (pediatric): Secondary | ICD-10-CM | POA: Diagnosis not present

## 2024-10-18 NOTE — Progress Notes (Signed)
 "     Electrophysiology Clinic Note    Date:  10/18/2024  Patient ID:  Alicia Moyer, Alicia Moyer 06/28/1961, MRN [id number] PCP:  [name], MD  Cardiologist:  Redell Cave, MD  Electrophysiologist:  Fonda Kitty, MD  Electrophysiology APP:  Jari Carollo, NP     Discussed the use of AI scribe software for clinical note transcription with the patient, who gave verbal consent to proceed.   Patient Profile    Chief Complaint: SND  History of Present Illness: Alicia Moyer is a 64 y.o. 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   She last saw NP Vivienne in clinic 07/2024 at which time she was very concerned about her bradycardia noted on Apple watch which was alerting her during sleep and naps if heart rate dropped below 50.  It did not alert her during daytime, waking hours.  She had self stopped her Coreg  prior to the visit, renexa was continued. Zio monitor at that time showed several daytime pauses, but on symptom activation. When patient was called with zio results, she indicated she had several instances of feeling like she would pass out.   I saw her for EP evaluation 09/2024 and recommended another 2 week zio.   She has since been eval'd by pulm for OSA and started on CPAP.   On follow-up today, she remembers several episode of LOC while wearing monitor where she was sitting on the couch and went out and then woke up several minutes later. She has a sick feeling in her stomach before the episodes. They have only happened while sitting a rest, and not while up being active.   She separately describes episodes of palpitations where she feels horrible with heart beating fast in her chest.  She did stop renexa as recommended at our last visit, has managable chest pain.  She told pulm providers not to order CPAP as she did not want to  pursue treatment for mild OSA.        Arrhythmia/Device History No specialty comments available.    ROS:  Please see the history of present illness. All other systems are reviewed and otherwise negative.    Physical Exam    VS:  BP 122/64 (BP Location: Left Arm, Patient Position: Sitting, Cuff Size: Normal)   Pulse 60   Wt 152 lb 6.4 oz (69.1 kg)   SpO2 98%   BMI 26.16 kg/m  BMI: Body mass index is 26.16 kg/m.     STOP-Bang Score:  3         Wt Readings from Last 3 Encounters:  10/18/24 152 lb 6.4 oz (69.1 kg)  09/08/24 154 lb 9.6 oz (70.1 kg)  08/09/24 151 lb 12.8 oz (68.9 kg)     GEN- The patient is well appearing, alert and oriented x 3 today.   Lungs- Clear to ausculation bilaterally, normal work of breathing.  Heart- Regular rate and rhythm, no murmurs, rubs or gallops Extremities- Trace peripheral edema, warm, dry   Studies Reviewed   Previous EP, cardiology notes.    EKG is not ordered. Personal review of EKG from 09/08/2024 shows:  SB at 53       Long term monitor, 10/18/2023 Patch Wear Time:  16 days and 16 hours (2025-12-08T09:19:59-0500 to 2025-12-27T21:24:54-0500)   Monitor 1 Patient had a min HR of 38  bpm, max HR of 143 bpm, and avg HR of 54 bpm. Predominant underlying rhythm was Sinus Rhythm. 1 run of Supraventricular Tachycardia occurred lasting 4 beats with a max rate of 130 bpm (avg 119 bpm). Idioventricular Rhythm was  present. Isolated SVEs were rare (<1.0%), SVE Couplets were rare (<1.0%), and SVE Triplets were rare (<1.0%). Isolated VEs were rare (<1.0%), and no VE Couplets or VE Triplets were present.    Monitor 2 Patient had a min HR of 33 bpm, max HR of 218 bpm, and avg HR of 59 bpm. Predominant underlying rhythm was Sinus Rhythm. 28 Supraventricular Tachycardia runs occurred, the run with the fastest interval lasting 11.2 secs with a max rate of 218 bpm, the  longest lasting 21.5 secs with an avg rate of 114 bpm. Isolated SVEs were rare  (<1.0%), SVE Couplets were rare (<1.0%), and SVE Triplets were rare (<1.0%). Isolated VEs were rare (<1.0%), and no VE Couplets or VE Triplets were present. Previously  notified: MD notification criteria for Symptomatic Bradycardia met - report posted prior to notification (NAR).   Long term zio, 08/22/2024 Patch Wear Time:  13 days and 23 hours (2025-10-21T15:37:27-0400 to 2025-11-04T14:37:27-0500)   Patient had a min HR of 29 bpm, max HR of 156 bpm, and avg HR of 55 bpm. Predominant underlying rhythm was Sinus Rhythm. 15 Supraventricular Tachycardia runs occurred, the run with the fastest interval lasting 6 beats with a max rate of 156 bpm, the  longest lasting 13 beats with an avg rate of 129 bpm. 27 Pauses occurred, the longest lasting 3.8 secs (16 bpm). Isolated SVEs were rare (<1.0%), SVE Couplets were rare (<1.0%), and SVE Triplets were rare (<1.0%). Isolated VEs were rare (<1.0%), and no  VE Couplets or VE Triplets were present. Ventricular Bigeminy was present.    Conclusion Average heart rate 55, range 29-1 56. 15 episodes of nonsustained SVT. 27 pauses noted, longest lasting 3.8 seconds. No atrial fibrillation or atrial flutter. No sustained arrhythmias.  Cardiac PET CT, 05/12/2024   LV perfusion is abnormal. There is evidence of ischemia. There is no evidence of infarction. Defect 1: There is a small defect with moderate reduction in uptake present in the apical inferior location(s) that is reversible. There is normal wall motion in the defect area. Consistent with ischemia.   Rest left ventricular function is normal. Rest EF: 66%. Stress left ventricular function is normal. Stress EF: 67%. End diastolic cavity size is normal. End systolic cavity size is normal.   Myocardial blood flow was computed to be 0.14ml/g/min at rest and 2.86ml/g/min at stress. Global myocardial blood flow reserve was 2.33 and was normal.   Coronary calcium  assessment not performed due to prior  revascularization.Due to prior LAD stent   Findings are consistent with ischemia. The study is low risk.   MBFR in small area of inferior apical ischemia 1.76  TTE, 01/05/2024  1. Left ventricular ejection fraction, by estimation, is 60 to 65%. Left ventricular ejection fraction by PLAX is 56 %. The left ventricle has normal function. The left ventricle has no regional wall motion abnormalities. Left ventricular diastolic parameters were normal. The average left ventricular global longitudinal strain is -16.4 %. The global longitudinal strain is abnormal.   2. Right ventricular systolic function is low normal. The right ventricular size is normal. There is moderately elevated pulmonary artery systolic pressure. The estimated right ventricular systolic pressure is 43.8 mmHg.   3. Left atrial size was mildly dilated.   4. The mitral  valve is normal in structure. Mild mitral valve regurgitation.   5. Tricuspid valve regurgitation is mild to moderate.   6. The aortic valve is tricuspid. Aortic valve regurgitation is not visualized.   7. The inferior vena cava is normal in size with <50% respiratory variability, suggesting right atrial pressure of 8 mmHg.    Assessment and Plan     #) tachy-brady #) SND #) SVT Long discussion with patient reviewing recent zio monitor that clearly shows daytime bradycardia episodes with HR in the 30-40s, and also episodes of SVT Her main complaint is daytime episodes of going out. Unclear exactly what heart rate/rhythm are during these episodes.  We discussed PPM including indications for implant, risks/benefits of procedure.  We also discussed that it would be reasonable to delay PPM implant until symptoms are worse.  Patient stated she wanted to talk with family more before deciding.   #) OSA Recent sleep test with mild OSA Recent zio with nocturnal pauses Strongly encouraged her to use CPAP        Current medicines are reviewed at length with the  patient today.   The patient does not have concerns regarding her medicines.  The following changes were made today:   none  Labs/ tests ordered today include:  No orders of the defined types were placed in this encounter.    Disposition: Follow up with Dr. Kennyth or EP APP in 6 months, sooner if needed    Follow-up with general cardiology in 3 months as previously recommended     Signed, [place] Baldwin, NP  10/18/2024  4:30 PM  Electrophysiology [facility] "

## 2024-10-18 NOTE — Patient Instructions (Addendum)
 Your physician has recommended that you have a pacemaker inserted. A pacemaker is a small device that is placed under the skin of your chest or abdomen to help control abnormal heart rhythms. This device uses electrical pulses to prompt the heart to beat at a normal rate. Pacemakers are used to treat heart rhythms that are too slow. Wire (leads) are attached to the pacemaker that goes into the chambers of you heart. This is done in the hospital. Most patients go home after their procedure the same day, but sometimes patients will stay overnight.    Medication Instructions:  No changes *If you need a refill on your cardiac medications before your next appointment, please call your pharmacy*  Lab Work: None today If you have labs (blood work) drawn today and your tests are completely normal, you will receive your results only by: MyChart Message (if you have MyChart) OR A paper copy in the mail If you have any lab test that is abnormal or we need to change your treatment, we will call you to review the results.  Testing/Procedures: None   Follow-Up: At [facility], you and your health needs are our priority.  As part of our continuing mission to provide you with exceptional heart care, our providers are all part of one team.  This team includes your primary Cardiologist (physician) and Advanced Practice Providers or APPs (Physician Assistants and Nurse Practitioners) who all work together to provide you with the care you need, when you need it.  Your next appointment:   6 month(s)  Provider:   Fonda Kitty, MD or Jestine Bicknell, NP       We recommend signing up for the patient portal called MyChart.  Sign up information is provided on this After Visit Summary.  MyChart is used to connect with patients for Virtual Visits (Telemedicine).  Patients are able to view lab/test results, encounter notes, upcoming appointments, etc.  Non-urgent messages can be sent to your provider as  well.   To learn more about what you can do with MyChart, go to [email or web address].   Other Instructions  Please discuss pacemaker with family and notify us  if you would like to proceed.

## 2024-10-23 DIAGNOSIS — I495 Sick sinus syndrome: Secondary | ICD-10-CM | POA: Diagnosis not present

## 2024-10-24 ENCOUNTER — Ambulatory Visit: Payer: Self-pay | Admitting: Cardiology

## 2024-11-11 ENCOUNTER — Emergency Department: Admission: EM | Admit: 2024-11-11 | Source: Home / Self Care

## 2024-11-11 ENCOUNTER — Other Ambulatory Visit: Payer: Self-pay

## 2024-11-11 ENCOUNTER — Emergency Department

## 2024-11-11 LAB — CBC
HCT: 42.7 % (ref 36.0–46.0)
Hemoglobin: 14.3 g/dL (ref 12.0–15.0)
MCH: 30.1 pg (ref 26.0–34.0)
MCHC: 33.5 g/dL (ref 30.0–36.0)
MCV: 89.9 fL (ref 80.0–100.0)
Platelets: 279 10*3/uL (ref 150–400)
RBC: 4.75 MIL/uL (ref 3.87–5.11)
RDW: 13.2 % (ref 11.5–15.5)
WBC: 13.9 10*3/uL — ABNORMAL HIGH (ref 4.0–10.5)
nRBC: 0 % (ref 0.0–0.2)

## 2024-11-11 LAB — TROPONIN T, HIGH SENSITIVITY: Troponin T High Sensitivity: 18 ng/L (ref 0–19)

## 2024-11-11 LAB — BASIC METABOLIC PANEL WITH GFR
Anion gap: 11 (ref 5–15)
BUN: 16 mg/dL (ref 8–23)
CO2: 26 mmol/L (ref 22–32)
Calcium: 8.7 mg/dL — ABNORMAL LOW (ref 8.9–10.3)
Chloride: 102 mmol/L (ref 98–111)
Creatinine, Ser: 0.94 mg/dL (ref 0.44–1.00)
GFR, Estimated: >60 mL/min
Glucose, Bld: 125 mg/dL — ABNORMAL HIGH (ref 70–99)
Potassium: 2.7 mmol/L — CL (ref 3.5–5.1)
Sodium: 140 mmol/L (ref 135–145)

## 2024-11-11 LAB — PROTIME-INR
INR: 1 (ref 0.8–1.2)
Prothrombin Time: 14.1 s (ref 11.4–15.2)

## 2024-11-11 MED ORDER — POTASSIUM CHLORIDE 10 MEQ/100ML IV SOLN
10.0000 meq | Freq: Once | INTRAVENOUS | Status: AC
  Administered 2024-11-12: 10 meq via INTRAVENOUS
  Filled 2024-11-11: qty 100

## 2024-11-11 MED ORDER — POTASSIUM CHLORIDE CRYS ER 20 MEQ PO TBCR
40.0000 meq | EXTENDED_RELEASE_TABLET | Freq: Once | ORAL | Status: AC
  Filled 2024-11-11: qty 2

## 2024-11-11 NOTE — ED Provider Notes (Incomplete)
 "  [facility] Provider Note    Event Date/Time   First MD Initiated Contact with Patient 11/11/24 2309     (approximate)   History   Palpitations and Fall   HPI Alicia Moyer is a 64 y.o. female  ***       Physical Exam   Triage Vital Signs: ED Triage Vitals  Encounter Vitals Group     BP 11/11/24 2222 (!) 163/112     Girls Systolic BP Percentile --      Girls Diastolic BP Percentile --      Boys Systolic BP Percentile --      Boys Diastolic BP Percentile --      Pulse Rate 11/11/24 2222 88     Resp 11/11/24 2222 16     Temp 11/11/24 2222 99.2 F (37.3 C)     Temp Source 11/11/24 2222 Oral     SpO2 11/11/24 2222 98 %     Weight --      Height 11/11/24 2228 1.626 m (5' 4)     Head Circumference --      Peak Flow --      Pain Score 11/11/24 2226 7     Pain Loc --      Pain Education --      Exclude from Growth Chart --     Most recent vital signs: Vitals:   11/11/24 2222  BP: (!) 163/112  Pulse: 88  Resp: 16  Temp: 99.2 F (37.3 C)  SpO2: 98%    General: Awake, no distress. *** CV:  Good peripheral perfusion. *** Resp:  Normal effort. Speaking easily and comfortably, no accessory muscle usage nor intercostal retractions.  *** Abd:  No distention. *** Other:  ***   ED Results / Procedures / Treatments   Labs (all labs ordered are listed, but only abnormal results are displayed) Labs Reviewed  BASIC METABOLIC PANEL WITH GFR - Abnormal; Notable for the following components:      Result Value   Potassium 2.7 (*)    Glucose, Bld 125 (*)    Calcium  8.7 (*)    All other components within normal limits  CBC - Abnormal; Notable for the following components:   WBC 13.9 (*)    All other components within normal limits  PROTIME-INR  MAGNESIUM  TROPONIN T, HIGH SENSITIVITY     EKG  ***   RADIOLOGY ***   PROCEDURES:  Critical Care performed: {CriticalCareYesNo:19197::Yes, see critical care procedure  note(s),No}  Procedures    IMPRESSION / MDM / ASSESSMENT AND PLAN / ED COURSE  I reviewed the triage vital signs and the nursing notes.                              Differential diagnosis includes, but is not limited to, ***  Patient's presentation is most consistent with {EM COPA:27473}  Labs/studies ordered (see ED course for additional labs and studies that may have been added later): ***  Interventions/Medications given:  Medications  potassium chloride  SA (KLOR-CON  M) CR tablet 40 mEq (has no administration in time range)  potassium chloride  10 mEq in 100 mL IVPB (has no administration in time range)   *** (Note:  hospital course my include additional interventions and/or labs/studies not listed above.)   ***  {**The patient is on the cardiac monitor to evaluate for evidence of arrhythmia and/or significant heart rate changes.**}  FINAL CLINICAL IMPRESSION(S) / ED DIAGNOSES   Final diagnoses:  None     Rx / DC Orders   ED Discharge Orders     None        Note:  This document was prepared using Dragon voice recognition software and may include unintentional dictation errors. "

## 2024-11-11 NOTE — ED Notes (Signed)
 Pt being monitored by CCMD

## 2024-11-11 NOTE — ED Notes (Signed)
 Lab called to advise of critical potassium of 2.7, provider notified

## 2024-11-11 NOTE — ED Triage Notes (Addendum)
 Pt to ED with c/o palpitations and frequent syncopal episodes leading to falls. Approx 2 syncopal episodes per day. Was not evaluated after the falls, last significant fall was Tuesday night. Pt reports hitting her head and had significant bleeding, also noted to have extensive bruising to left arm. Pt reports taking plavix . Pt with hx of MI in 2024, followed up with cardiology who has had her wear a monitor on 2 separate occasions. Noted to have heart rate fluctuate from 30s-120s. Cardiology currently arranging for pacemaker placement.

## 2024-11-11 NOTE — ED Triage Notes (Signed)
 First Nurse Note: pt presents for fluctuating HR (between 30-120). Endorsing pain with palpitations. 147/60, HR 80, 100% RA, RR 16. Denies SOB- pain worse with inspiration. F/u with cardiology on Tuesday for pacemaker consult. No medications given. [address] lead ST for [facility]

## 2024-12-27 ENCOUNTER — Ambulatory Visit: Admitting: Cardiology

## 2025-01-20 ENCOUNTER — Ambulatory Visit: Admitting: Cardiology
